# Patient Record
Sex: Female | Born: 1965 | Race: White | Hispanic: No | Marital: Married | Smoking: Current every day smoker
Health system: Southern US, Community
[De-identification: ages and names within clinical notes are randomized; demographics above are authoritative.]

## PROBLEM LIST (undated history)

## (undated) DIAGNOSIS — Z973 Presence of spectacles and contact lenses: Secondary | ICD-10-CM

## (undated) DIAGNOSIS — N393 Stress incontinence (female) (male): Secondary | ICD-10-CM

## (undated) DIAGNOSIS — R002 Palpitations: Secondary | ICD-10-CM

## (undated) DIAGNOSIS — I824Z9 Acute embolism and thrombosis of unspecified deep veins of unspecified distal lower extremity: Secondary | ICD-10-CM

## (undated) HISTORY — DX: Acute embolism and thrombosis of unspecified deep veins of unspecified distal lower extremity: I82.4Z9

## (undated) HISTORY — DX: Palpitations: R00.2

## (undated) HISTORY — PX: OTHER SURGICAL HISTORY: SHX169

---

## 1993-11-10 HISTORY — PX: ABDOMINAL HYSTERECTOMY: SHX81

## 2000-09-01 ENCOUNTER — Other Ambulatory Visit: Admission: RE | Admit: 2000-09-01 | Discharge: 2000-09-01 | Payer: Self-pay | Admitting: Obstetrics and Gynecology

## 2001-01-25 ENCOUNTER — Inpatient Hospital Stay (HOSPITAL_COMMUNITY): Admission: RE | Admit: 2001-01-25 | Discharge: 2001-01-28 | Payer: Self-pay | Admitting: Obstetrics and Gynecology

## 2001-01-25 HISTORY — PX: OTHER SURGICAL HISTORY: SHX169

## 2002-01-20 ENCOUNTER — Other Ambulatory Visit: Admission: RE | Admit: 2002-01-20 | Discharge: 2002-01-20 | Payer: Self-pay | Admitting: Obstetrics and Gynecology

## 2003-05-24 ENCOUNTER — Other Ambulatory Visit: Admission: RE | Admit: 2003-05-24 | Discharge: 2003-05-24 | Payer: Self-pay | Admitting: Obstetrics and Gynecology

## 2004-08-15 ENCOUNTER — Other Ambulatory Visit: Admission: RE | Admit: 2004-08-15 | Discharge: 2004-08-15 | Payer: Self-pay | Admitting: Obstetrics and Gynecology

## 2005-03-11 ENCOUNTER — Ambulatory Visit: Payer: Self-pay | Admitting: Family Medicine

## 2005-11-07 ENCOUNTER — Other Ambulatory Visit: Admission: RE | Admit: 2005-11-07 | Discharge: 2005-11-07 | Payer: Self-pay | Admitting: Obstetrics and Gynecology

## 2006-11-20 ENCOUNTER — Other Ambulatory Visit: Admission: RE | Admit: 2006-11-20 | Discharge: 2006-11-20 | Payer: Self-pay | Admitting: Obstetrics and Gynecology

## 2007-11-26 ENCOUNTER — Other Ambulatory Visit: Admission: RE | Admit: 2007-11-26 | Discharge: 2007-11-26 | Payer: Self-pay | Admitting: Obstetrics and Gynecology

## 2009-01-09 ENCOUNTER — Other Ambulatory Visit: Admission: RE | Admit: 2009-01-09 | Discharge: 2009-01-09 | Payer: Self-pay | Admitting: Obstetrics and Gynecology

## 2009-01-09 ENCOUNTER — Ambulatory Visit: Payer: Self-pay | Admitting: Obstetrics and Gynecology

## 2009-01-09 ENCOUNTER — Encounter: Payer: Self-pay | Admitting: Obstetrics and Gynecology

## 2010-02-25 ENCOUNTER — Ambulatory Visit: Payer: Self-pay | Admitting: Obstetrics and Gynecology

## 2010-02-25 ENCOUNTER — Other Ambulatory Visit: Admission: RE | Admit: 2010-02-25 | Discharge: 2010-02-25 | Payer: Self-pay | Admitting: Obstetrics and Gynecology

## 2011-03-28 NOTE — H&P (Signed)
[facility]  Patient:    Miranda Fleming, Miranda Fleming                    MRN: [id number] Attending:  Rande Brunt. Eda Paschal, M.D.                         History and Physical  CHIEF COMPLAINT:  Persistent left ovarian cyst.  HISTORY OF PRESENT ILLNESS:  The patient is a 45 year old gravida 2, para 2, AB 0, who had been found to have a cyst on her left ovary.  It was first seen in November.  Follow-up ultrasound on February 6 showed that it had persisted. Although it is only approximately 2 cm, it is complex.  It has an irregular cystic lumen in it.  It has increased vascular flow to it.  There is also a second cyst next to it with a thick wall but is otherwise echo-free.  Patient has been given the option of observing it because of the unlikelihood that it is malignant, but obviously because of the complexity of the cyst plus the increased vascular flow, she understands that that would not be a completely safe procedure.  In addition to that, her grandmother on her mothers side had ovarian cancer.  In addition, her mother has had breast cancer.  As a result of increased family history, persistent left ovarian cyst, she now enters the hospital for exploratory laparotomy and definitive surgery.  Patient has a history of recurrent ovarian cysts.  In 1987 in [place], [place], she underwent a laparoscopy for ovarian cyst.  In 1989, 1990, and 1993 in [place], [place], she underwent both laparoscopies and laparotomies for persistent ovarian cyst.  In 1994, in [place], she underwent another laparotomy because of an additional ovarian cyst, and finally in 1995, on her sixth procedure, she underwent a TAH-right S&O for pelvic adhesive disease, pelvic pain, and recurrent ovarian cysts.  Because of her strong family history and her unwillingness to have to go still another procedure after this one, she will undergo a left salpingo-oophorectomy.  She understands  the issues of estrogen replacement therapy but based on family history and number of previous procedures, she elects the above.  PAST MEDICAL HISTORY:  Please see above.  MEDICATIONS:  Advil for pain.  ALLERGIES:  PENICILLIN.  FAMILY HISTORY:  See above.  In addition, she has history of lung and prostate cancer in her family.  She has an aunt on her mothers side with diabetes. Her grandmother, father, and mother are all hypertensive.  SOCIAL HISTORY:  She was a smoker for a long period of time but has now quit over a year ago, and she uses alcohol occasionally.  REVIEW OF SYSTEMS:  HEENT:  Negative.  CARDIAC:  Negative.  RESPIRATORY: Negative.  GASTROINTESTINAL:  Negative.  GENITOURINARY:  Negative. NEUROMUSCULAR:  Negative.  ENDOCRINE:  Negative.  PSYCHIATRIC:  Negative.  PHYSICAL EXAMINATION:  VITAL SIGNS:  Blood pressure is 116/60, pulse is 80 and regular, respirations 16 and nonlabored.  She is afebrile.  GENERAL:  The patient is a well-developed, well-nourished female in no acute distress.  HEENT:  All within normal limits.  NECK:  Supple.  Trachea in the midline.  Thyroid is not enlarged.  CHEST:  The lungs are clear to P&A.  CARDIAC:  No thrills, heaves, or murmurs.  BREASTS:  No masses.  ABDOMEN:  Soft without guarding, rebound, or masses.  PELVIC:  External and vaginal is within normal limits.  Pap smear shows no atypia.  Bimanual and rectal fail to reveal the persistent ovarian cyst.  EXTREMITIES:  Within normal limits.  ADMISSION IMPRESSION:  Persistent recurrent left ovarian cyst with a family history of ovarian and breast cancer.  PLAN:  Exploratory laparotomy with left salpingo-oophorectomy. DD:  01/25/01 TD:  01/25/01 Job: [id number]

## 2011-03-28 NOTE — Discharge Summary (Signed)
[facility]  Patient:    Miranda Fleming, Miranda Fleming               MRN: [id number] Adm. Date:  [id number] Disc. Date: [id number] Attending:  Sharon Mt                           Discharge Summary  HOSPITAL COURSE: The patient is a 45 year old female who was admitted to the hospital with persistent adnexal mass and a very bad family history of ovarian and breast cancer.  On the day of admission, she was taken to the operating room.  Exploratory laparotomy with left salpingo-oophorectomy was performed.  Frozen section was benign.  Postoperatively, the patient had a little bit of trouble with an ileus.  She was maintained on IV fluids, given Dulcolax suppositories, and finally by very late on the second postoperative night she started to pass gas.  By the third postoperative day, she was stable and ready for discharge.  DISCHARGE MEDICATIONS: 1. Climara patch 0.05 mg 1 weekly. 2. Tylox for pain relief.  ACTIVITY:  Ambulatory.  DIET:  Regular.  FOLLOW-UP:  She is to return to our office in three weeks for further follow-up.  PATHOLOGY:  Final pathology report revealed benign ovary with cystic follicles and fallopian tubes associated with benign peritubal cyst.  DISCHARGE DIAGNOSIS:  Persistent left adnexal mass.  PROCEDURES:  Exploratory laparotomy with left salpingo-oophorectomy.  CONDITION ON DISCHARGE:  Improved. DD:  01/28/01 TD:  01/28/01 Job: [id number]

## 2011-03-28 NOTE — Op Note (Signed)
[facility]  Patient:    Miranda Fleming, Miranda Fleming               MRN: [id number] Proc. Date: 01/25/01 Adm. Date:  [id number] Attending:  Sharon Mt                           Operative Report  PREOPERATIVE DIAGNOSIS:  Persistent left ovarian cyst.  POSTOPERATIVE DIAGNOSIS:  Persistent left ovarian cyst, benign.  OPERATION PERFORMED:  Exploratory laparotomy with left S&O.  SURGEON:  Dr. Eda Paschal.  FIRST ASSISTANT:  Dr. Katy Fitch, M.D.  ANESTHESIA:  General endotracheal.  FINDINGS:  At the time of laparotomy, the patient had some sigmoid colon adherent to the cul-de-sac. The right broad ligament was completely free of any disease. The ileocecal junction was identified and was free of any disease. The patient had a normal appendix. The right upper quadrant was visualized and was free of any disease. The left ovary had an enlargement of a 2-3 cm cyst. Once the cyst had been removed which was done intact, it was opened and had clear fluid in the cyst as well as some hemorrhage in it.  DESCRIPTION OF PROCEDURE:  After adequate general endotracheal anesthesia, the patient was placed in the supine position and prepped and draped in the usual sterile fashion. A Foley catheter was in the patients bladder, a Pfannenstiel incision was made, the fascia was opened transversely, and the peritoneum was entered vertically. Subcutaneous bleeders were clamped and Bovied as encountered. When the peritoneal cavity was opened, omental adhesions were lysed freeing up the omentum from the anterior peritoneal wall. Sigmoid colon adhesions were lysed freeing up the pelvis. Peritoneal washings were then obtained. At this point, the round ligament was bovied and cut. The vesicouterine fold to peritoneum and the peritoneum towards the IP ligament were sharply incised. The retroperitoneal space was entered, the ureter was identified. The infundibulopelvic ligament  was bilaterally clamped, cut and suture ligated with #0 Vicryl. The adnexa was freed up from the broad ligament by sharp dissection. The remaining pedicle was clamped and cut and tied with #0 Vicryl and frozen section was obtained which came back benign. Two sponge, needle and instrument counts were correct. The fascia was closed with two running #0 Vicryl and the skin was closed with staples. Estimated blood loss for the entire procedure was 100 cc with none replaced. The patient tolerated the procedure well and left the operating room in satisfactory condition draining clear urine from her Foley catheter. DD:  01/25/01 TD:  01/25/01 Job: [id number]

## 2011-04-30 ENCOUNTER — Encounter (INDEPENDENT_AMBULATORY_CARE_PROVIDER_SITE_OTHER): Payer: BC Managed Care – PPO | Admitting: Obstetrics and Gynecology

## 2011-04-30 ENCOUNTER — Other Ambulatory Visit: Payer: Self-pay | Admitting: Obstetrics and Gynecology

## 2011-04-30 ENCOUNTER — Other Ambulatory Visit (HOSPITAL_COMMUNITY)
Admission: RE | Admit: 2011-04-30 | Discharge: 2011-04-30 | Disposition: A | Discharge status: Home / Self Care | Payer: BC Managed Care – PPO | Source: Ambulatory Visit | Attending: Obstetrics and Gynecology | Admitting: Obstetrics and Gynecology

## 2011-04-30 DIAGNOSIS — N951 Menopausal and female climacteric states: Secondary | ICD-10-CM

## 2011-04-30 DIAGNOSIS — Z01419 Encounter for gynecological examination (general) (routine) without abnormal findings: Secondary | ICD-10-CM

## 2011-04-30 DIAGNOSIS — Z124 Encounter for screening for malignant neoplasm of cervix: Secondary | ICD-10-CM | POA: Insufficient documentation

## 2011-04-30 DIAGNOSIS — Z13220 Encounter for screening for lipoid disorders: Secondary | ICD-10-CM

## 2011-04-30 DIAGNOSIS — R809 Proteinuria, unspecified: Secondary | ICD-10-CM

## 2011-05-20 ENCOUNTER — Encounter (INDEPENDENT_AMBULATORY_CARE_PROVIDER_SITE_OTHER): Payer: BC Managed Care – PPO

## 2011-05-20 DIAGNOSIS — M899 Disorder of bone, unspecified: Secondary | ICD-10-CM

## 2011-07-26 ENCOUNTER — Other Ambulatory Visit: Payer: Self-pay | Admitting: Obstetrics and Gynecology

## 2011-07-26 DIAGNOSIS — F419 Anxiety disorder, unspecified: Secondary | ICD-10-CM

## 2011-07-28 NOTE — Telephone Encounter (Signed)
Rx for Xanax 0.25 by mouth at bedtime when necessary #30 with 1 refill was called into [facility] per patient's request.

## 2011-09-09 ENCOUNTER — Encounter: Payer: Self-pay | Admitting: Obstetrics and Gynecology

## 2011-09-21 ENCOUNTER — Other Ambulatory Visit: Payer: Self-pay | Admitting: Obstetrics and Gynecology

## 2011-09-22 NOTE — Telephone Encounter (Signed)
GENERIC XANAX RX CALLED IN.

## 2011-12-18 ENCOUNTER — Other Ambulatory Visit: Payer: Self-pay | Admitting: Obstetrics and Gynecology

## 2012-02-10 ENCOUNTER — Telehealth: Payer: Self-pay | Admitting: *Deleted

## 2012-02-10 MED ORDER — MOMETASONE FUROATE 50 MCG/ACT NA SUSP: 2.0000 | Freq: Every day | NASAL | Status: DC

## 2012-02-10 NOTE — Telephone Encounter (Signed)
If  Nasonex working okay to refill.

## 2012-02-10 NOTE — Telephone Encounter (Signed)
Lm on pt vm that rx sent to pharmacy.

## 2012-02-10 NOTE — Telephone Encounter (Signed)
Pt is calling c/o allergy problems. Pt was given a sample of nasonex at urgent care a couple months ago. She would like a rx with this to help with her allergies. She wasn't sure if you would give this to her or go back to urgent care? Please advise

## 2012-03-17 ENCOUNTER — Other Ambulatory Visit: Payer: Self-pay | Admitting: Obstetrics and Gynecology

## 2012-03-18 NOTE — Telephone Encounter (Signed)
rx called in

## 2012-05-12 ENCOUNTER — Other Ambulatory Visit: Payer: Self-pay | Admitting: Obstetrics and Gynecology

## 2012-06-10 ENCOUNTER — Other Ambulatory Visit: Payer: Self-pay | Admitting: Obstetrics and Gynecology

## 2012-06-11 NOTE — Telephone Encounter (Signed)
rx called in

## 2012-07-01 ENCOUNTER — Encounter: Payer: Self-pay | Admitting: Obstetrics and Gynecology

## 2012-07-01 ENCOUNTER — Ambulatory Visit (INDEPENDENT_AMBULATORY_CARE_PROVIDER_SITE_OTHER): Payer: BC Managed Care – PPO | Admitting: Obstetrics and Gynecology

## 2012-07-01 VITALS — BP 120/76 | Ht 65.0 in | Wt 128.0 lb

## 2012-07-01 DIAGNOSIS — N83209 Unspecified ovarian cyst, unspecified side: Secondary | ICD-10-CM | POA: Insufficient documentation

## 2012-07-01 DIAGNOSIS — F419 Anxiety disorder, unspecified: Secondary | ICD-10-CM | POA: Insufficient documentation

## 2012-07-01 DIAGNOSIS — Z01419 Encounter for gynecological examination (general) (routine) without abnormal findings: Secondary | ICD-10-CM

## 2012-07-01 LAB — CBC WITH DIFFERENTIAL/PLATELET
Basophils Absolute: 0 10*3/uL (ref 0.0–0.1)
Basophils Relative: 0 % (ref 0–1)
Eosinophils Absolute: 0.1 10*3/uL (ref 0.0–0.7)
Eosinophils Relative: 1 % (ref 0–5)
HCT: 37.1 % (ref 36.0–46.0)
MCHC: 34 g/dL (ref 30.0–36.0)
MCV: 92.1 fL (ref 78.0–100.0)
Monocytes Absolute: 0.5 10*3/uL (ref 0.1–1.0)
Neutro Abs: 3.6 10*3/uL (ref 1.7–7.7)
RDW: 13.4 % (ref 11.5–15.5)

## 2012-07-01 MED ORDER — ALPRAZOLAM 0.25 MG PO TABS: 0.2500 mg | ORAL_TABLET | Freq: Every evening | ORAL | Status: DC | PRN

## 2012-07-01 MED ORDER — ESTRADIOL 0.5 MG PO TABS: 0.5000 mg | ORAL_TABLET | Freq: Every day | ORAL | Status: DC

## 2012-07-01 NOTE — Patient Instructions (Signed)
Continue yearly mammograms. Bone density in 2014. 

## 2012-07-01 NOTE — Progress Notes (Signed)
Patient came to see me today for her annual GYN exam. She is status post TAH, right S&O done in 1994 for persistent ovarian cysts, dysmenorrhea and dyspareunia. She then had a left S&O in 2002 for persistent ovarian cysts and pelvic pain. She has done well since then. She does well on half a milligram of estradiol daily for estrogen replacement. She is up-to-date on mammograms. She had a bone density in July, 2012 showing low bone mass without an elevated FRAX risk. She has never had an abnormal Pap smear. Her last Pap smear was 2012. She takes Xanax for anxiety that we give her. She uses 30-35 per month. She is having no vaginal bleeding. She is having no pelvic pain.  HEENT: Within normal limits.Kennon Portela present. Neck: No masses. Supraclavicular lymph nodes: Not enlarged. Breasts: Examined in both sitting and lying position. Symmetrical without skin changes or masses. Abdomen: Soft no masses guarding or rebound. No hernias. Pelvic: External within normal limits. BUS within normal limits. Vaginal examination shows good estrogen effect, no cystocele enterocele or rectocele. Cervix and uterus absent. Adnexa within normal limits. Rectovaginal confirmatory. Extremities within normal limits.  Assessment: #1. Normal GYN exam #2. Osteopenia #3. Anxiety  Plan: Continue estradiol half a milligram daily. Continue Xanax. Continue yearly mammograms. Bone density in 2014.The new Pap smear guidelines were discussed with the patient. No pap done.

## 2012-07-02 LAB — URINALYSIS W MICROSCOPIC + REFLEX CULTURE
Bilirubin Urine: NEGATIVE
Leukocytes, UA: NEGATIVE
Nitrite: NEGATIVE
Protein, ur: NEGATIVE mg/dL
Squamous Epithelial / LPF: NONE SEEN
Urobilinogen, UA: 0.2 mg/dL (ref 0.0–1.0)

## 2012-07-02 LAB — LIPID PANEL
HDL: 74 mg/dL (ref >39)
LDL Cholesterol: 69 mg/dL (ref 0–99)
Triglycerides: 98 mg/dL (ref <150)

## 2012-07-02 LAB — COMPREHENSIVE METABOLIC PANEL
AST: 17 U/L (ref 0–37)
Alkaline Phosphatase: 35 U/L — ABNORMAL LOW (ref 39–117)
BUN: 13 mg/dL (ref 6–23)
Creat: 0.66 mg/dL (ref 0.50–1.10)
Total Bilirubin: 0.7 mg/dL (ref 0.3–1.2)

## 2012-07-07 ENCOUNTER — Telehealth: Payer: Self-pay | Admitting: *Deleted

## 2012-07-07 MED ORDER — ESTRADIOL 1 MG PO TABS: 1.0000 mg | ORAL_TABLET | Freq: Every day | ORAL | Status: DC

## 2012-07-07 NOTE — Addendum Note (Signed)
Addended by: Aura Camps on: 07/07/2012 02:48 PM   Modules accepted: Orders

## 2012-07-07 NOTE — Telephone Encounter (Signed)
Pt had annual done on 07/01/12 giving rx for estradiol .5 mg pt said that she takes 1 mg daily. Per paper chart in 04/30/11 pt had increase on estradiol dose. Please advise

## 2012-07-07 NOTE — Telephone Encounter (Signed)
Give her years prescription for 1 mg estradiol.

## 2012-07-07 NOTE — Telephone Encounter (Signed)
Correct rx sent, left on voicemail rx sent.

## 2012-07-09 ENCOUNTER — Encounter: Payer: Self-pay | Admitting: Obstetrics and Gynecology

## 2012-09-02 ENCOUNTER — Encounter: Payer: Self-pay | Admitting: Obstetrics and Gynecology

## 2013-09-01 ENCOUNTER — Other Ambulatory Visit: Payer: Self-pay | Admitting: Obstetrics and Gynecology

## 2013-09-28 ENCOUNTER — Other Ambulatory Visit: Payer: Self-pay | Admitting: Urology

## 2013-10-26 ENCOUNTER — Encounter (HOSPITAL_BASED_OUTPATIENT_CLINIC_OR_DEPARTMENT_OTHER): Payer: Self-pay | Admitting: *Deleted

## 2013-10-26 NOTE — Progress Notes (Signed)
NPO AFTER MN. ARRIVE AT 0715. NEEDS HG. WILL TAKE ESTRADIOL AM DOS W/ SIP OF WATER. REVIEWED RCC GUIDELINES.

## 2013-10-31 ENCOUNTER — Encounter (HOSPITAL_BASED_OUTPATIENT_CLINIC_OR_DEPARTMENT_OTHER)
Admission: RE | Disposition: A | Discharge status: Home / Self Care | Payer: Self-pay | Source: Ambulatory Visit | Attending: Urology

## 2013-10-31 ENCOUNTER — Encounter (HOSPITAL_BASED_OUTPATIENT_CLINIC_OR_DEPARTMENT_OTHER): Payer: BC Managed Care – PPO | Admitting: Anesthesiology

## 2013-10-31 ENCOUNTER — Ambulatory Visit (HOSPITAL_BASED_OUTPATIENT_CLINIC_OR_DEPARTMENT_OTHER)
Admission: RE | Admit: 2013-10-31 | Discharge: 2013-11-01 | Disposition: A | Discharge status: Home / Self Care | Payer: BC Managed Care – PPO | Source: Ambulatory Visit | Attending: Urology | Admitting: Urology

## 2013-10-31 ENCOUNTER — Encounter (HOSPITAL_BASED_OUTPATIENT_CLINIC_OR_DEPARTMENT_OTHER): Payer: Self-pay | Admitting: Anesthesiology

## 2013-10-31 ENCOUNTER — Ambulatory Visit (HOSPITAL_BASED_OUTPATIENT_CLINIC_OR_DEPARTMENT_OTHER): Payer: BC Managed Care – PPO | Admitting: Anesthesiology

## 2013-10-31 DIAGNOSIS — Z90710 Acquired absence of both cervix and uterus: Secondary | ICD-10-CM | POA: Insufficient documentation

## 2013-10-31 DIAGNOSIS — Z87891 Personal history of nicotine dependence: Secondary | ICD-10-CM | POA: Insufficient documentation

## 2013-10-31 DIAGNOSIS — N393 Stress incontinence (female) (male): Secondary | ICD-10-CM | POA: Diagnosis present

## 2013-10-31 HISTORY — PX: PUBOVAGINAL SLING: SHX1035

## 2013-10-31 HISTORY — DX: Presence of spectacles and contact lenses: Z97.3

## 2013-10-31 HISTORY — DX: Stress incontinence (female) (male): N39.3

## 2013-10-31 SURGERY — CREATION, PUBOVAGINAL SLING
Anesthesia: General | Site: Vagina

## 2013-10-31 MED ORDER — PROPOFOL 10 MG/ML IV BOLUS
INTRAVENOUS | Status: DC | PRN
  Administered 2013-10-31: 200 mg via INTRAVENOUS
  Administered 2013-10-31: 50 mg via INTRAVENOUS

## 2013-10-31 MED ORDER — CIPROFLOXACIN HCL 500 MG PO TABS: 500.0000 mg | ORAL_TABLET | Freq: Two times a day (BID) | ORAL | Status: DC

## 2013-10-31 MED ORDER — DIPHENHYDRAMINE HCL 50 MG/ML IJ SOLN
12.5000 mg | Freq: Four times a day (QID) | INTRAMUSCULAR | Status: DC | PRN
  Filled 2013-10-31: qty 0.5

## 2013-10-31 MED ORDER — ESTRADIOL 0.1 MG/GM VA CREA
TOPICAL_CREAM | VAGINAL | Status: DC | PRN
  Administered 2013-10-31: 1 via VAGINAL

## 2013-10-31 MED ORDER — LACTATED RINGERS IV SOLN
INTRAVENOUS | Status: DC
  Administered 2013-10-31: 08:00:00 via INTRAVENOUS
  Filled 2013-10-31: qty 1000

## 2013-10-31 MED ORDER — OXYCODONE-ACETAMINOPHEN 5-325 MG PO TABS
ORAL_TABLET | ORAL | Status: AC
  Filled 2013-10-31: qty 2

## 2013-10-31 MED ORDER — HYDROMORPHONE HCL PF 1 MG/ML IJ SOLN
0.2500 mg | INTRAMUSCULAR | Status: DC | PRN
  Filled 2013-10-31: qty 1

## 2013-10-31 MED ORDER — DEXAMETHASONE SODIUM PHOSPHATE 4 MG/ML IJ SOLN
INTRAMUSCULAR | Status: DC | PRN
  Administered 2013-10-31: 10 mg via INTRAVENOUS

## 2013-10-31 MED ORDER — FENTANYL CITRATE 0.05 MG/ML IJ SOLN
INTRAMUSCULAR | Status: AC
  Filled 2013-10-31: qty 6

## 2013-10-31 MED ORDER — SODIUM CHLORIDE 0.9 % IR SOLN
Status: DC | PRN
  Administered 2013-10-31: 09:00:00

## 2013-10-31 MED ORDER — MIDAZOLAM HCL 5 MG/5ML IJ SOLN
INTRAMUSCULAR | Status: DC | PRN
  Administered 2013-10-31 (×2): 1 mg via INTRAVENOUS

## 2013-10-31 MED ORDER — CIPROFLOXACIN IN D5W 200 MG/100ML IV SOLN
INTRAVENOUS | Status: AC
  Filled 2013-10-31: qty 100

## 2013-10-31 MED ORDER — MIDAZOLAM HCL 2 MG/2ML IJ SOLN
INTRAMUSCULAR | Status: AC
  Filled 2013-10-31: qty 2

## 2013-10-31 MED ORDER — DIPHENHYDRAMINE HCL 12.5 MG/5ML PO ELIX
12.5000 mg | ORAL_SOLUTION | Freq: Four times a day (QID) | ORAL | Status: DC | PRN
  Filled 2013-10-31: qty 10

## 2013-10-31 MED ORDER — ZOLPIDEM TARTRATE 5 MG PO TABS
5.0000 mg | ORAL_TABLET | Freq: Every evening | ORAL | Status: DC | PRN
  Administered 2013-10-31: 5 mg via ORAL
  Filled 2013-10-31: qty 1

## 2013-10-31 MED ORDER — MIDAZOLAM HCL 5 MG/ML IJ SOLN
0.5000 mg | INTRAMUSCULAR | Status: DC | PRN
  Administered 2013-10-31 (×2): 0.5 mg via INTRAVENOUS
  Administered 2013-10-31: 1 mg via INTRAVENOUS
  Filled 2013-10-31: qty 1

## 2013-10-31 MED ORDER — ESTRADIOL 1 MG PO TABS
1.0000 mg | ORAL_TABLET | Freq: Every day | ORAL | Status: DC
  Filled 2013-10-31: qty 1

## 2013-10-31 MED ORDER — FLUTICASONE PROPIONATE 50 MCG/ACT NA SUSP
1.0000 | Freq: Every day | NASAL | Status: DC
  Filled 2013-10-31: qty 16

## 2013-10-31 MED ORDER — HYDROMORPHONE HCL PF 1 MG/ML IJ SOLN
0.5000 mg | INTRAMUSCULAR | Status: DC | PRN
  Filled 2013-10-31: qty 1

## 2013-10-31 MED ORDER — CIPROFLOXACIN HCL 250 MG PO TABS
ORAL_TABLET | ORAL | Status: AC
  Filled 2013-10-31: qty 1

## 2013-10-31 MED ORDER — OXYCODONE-ACETAMINOPHEN 5-325 MG PO TABS
ORAL_TABLET | ORAL | Status: AC
  Filled 2013-10-31: qty 1

## 2013-10-31 MED ORDER — ONDANSETRON HCL 4 MG/2ML IJ SOLN
INTRAMUSCULAR | Status: DC | PRN
  Administered 2013-10-31: 4 mg via INTRAVENOUS

## 2013-10-31 MED ORDER — KETOROLAC TROMETHAMINE 30 MG/ML IJ SOLN
15.0000 mg | Freq: Once | INTRAMUSCULAR | Status: AC | PRN
  Filled 2013-10-31: qty 1

## 2013-10-31 MED ORDER — SENNA 8.6 MG PO TABS
1.0000 | ORAL_TABLET | Freq: Two times a day (BID) | ORAL | Status: DC
  Administered 2013-10-31: 8.6 mg via ORAL
  Filled 2013-10-31: qty 1

## 2013-10-31 MED ORDER — ACETAMINOPHEN 10 MG/ML IV SOLN
INTRAVENOUS | Status: DC | PRN
  Administered 2013-10-31: 1000 mg via INTRAVENOUS

## 2013-10-31 MED ORDER — OXYCODONE-ACETAMINOPHEN 5-325 MG PO TABS
1.0000 | ORAL_TABLET | ORAL | Status: DC | PRN
  Administered 2013-10-31: 2 via ORAL
  Administered 2013-10-31 (×2): 1 via ORAL
  Administered 2013-11-01: 2 via ORAL
  Filled 2013-10-31: qty 2

## 2013-10-31 MED ORDER — FENTANYL CITRATE 0.05 MG/ML IJ SOLN
INTRAMUSCULAR | Status: DC | PRN
  Administered 2013-10-31: 25 ug via INTRAVENOUS
  Administered 2013-10-31: 50 ug via INTRAVENOUS
  Administered 2013-10-31 (×9): 25 ug via INTRAVENOUS

## 2013-10-31 MED ORDER — PHENAZOPYRIDINE HCL 100 MG PO TABS
ORAL_TABLET | ORAL | Status: AC
  Filled 2013-10-31: qty 2

## 2013-10-31 MED ORDER — KETOROLAC TROMETHAMINE 30 MG/ML IJ SOLN
INTRAMUSCULAR | Status: DC | PRN
  Administered 2013-10-31: 30 mg via INTRAVENOUS

## 2013-10-31 MED ORDER — BUPIVACAINE-EPINEPHRINE 0.5% -1:200000 IJ SOLN
INTRAMUSCULAR | Status: DC | PRN
  Administered 2013-10-31: 25 mL

## 2013-10-31 MED ORDER — CIPROFLOXACIN IN D5W 400 MG/200ML IV SOLN
INTRAVENOUS | Status: AC
  Filled 2013-10-31: qty 200

## 2013-10-31 MED ORDER — SODIUM CHLORIDE 0.45 % IV SOLN
INTRAVENOUS | Status: DC
  Administered 2013-10-31 – 2013-11-01 (×2): via INTRAVENOUS
  Filled 2013-10-31: qty 1000

## 2013-10-31 MED ORDER — CIPROFLOXACIN HCL 500 MG PO TABS
500.0000 mg | ORAL_TABLET | Freq: Two times a day (BID) | ORAL | Status: DC
  Administered 2013-10-31 – 2013-11-01 (×2): 500 mg via ORAL
  Filled 2013-10-31: qty 1

## 2013-10-31 MED ORDER — BELLADONNA ALKALOIDS-OPIUM 16.2-60 MG RE SUPP
RECTAL | Status: AC
  Filled 2013-10-31: qty 1

## 2013-10-31 MED ORDER — OXYCODONE-ACETAMINOPHEN 5-325 MG PO TABS: 1.0000 | ORAL_TABLET | ORAL | Status: DC | PRN

## 2013-10-31 MED ORDER — ONDANSETRON HCL 4 MG/2ML IJ SOLN
4.0000 mg | INTRAMUSCULAR | Status: DC | PRN
  Filled 2013-10-31: qty 2

## 2013-10-31 MED ORDER — LACTATED RINGERS IV SOLN
INTRAVENOUS | Status: DC | PRN
  Administered 2013-10-31 (×2): via INTRAVENOUS

## 2013-10-31 MED ORDER — ACETAMINOPHEN 325 MG PO TABS
650.0000 mg | ORAL_TABLET | ORAL | Status: DC | PRN
  Filled 2013-10-31: qty 2

## 2013-10-31 MED ORDER — BELLADONNA ALKALOIDS-OPIUM 16.2-60 MG RE SUPP
RECTAL | Status: DC | PRN
  Administered 2013-10-31: 1 via RECTAL

## 2013-10-31 MED ORDER — STERILE WATER FOR IRRIGATION IR SOLN
Status: DC | PRN
  Administered 2013-10-31: 3000 mL via INTRAVESICAL

## 2013-10-31 MED ORDER — ZOLPIDEM TARTRATE 5 MG PO TABS
ORAL_TABLET | ORAL | Status: AC
  Filled 2013-10-31: qty 1

## 2013-10-31 MED ORDER — ESTRADIOL 1 MG PO TABS
0.5000 mg | ORAL_TABLET | Freq: Every day | ORAL | Status: DC
  Filled 2013-10-31: qty 0.5

## 2013-10-31 MED ORDER — SENNA 8.6 MG PO TABS
ORAL_TABLET | ORAL | Status: AC
  Filled 2013-10-31: qty 1

## 2013-10-31 MED ORDER — CIPROFLOXACIN IN D5W 400 MG/200ML IV SOLN
400.0000 mg | INTRAVENOUS | Status: AC
  Administered 2013-10-31: 400 mg via INTRAVENOUS
  Filled 2013-10-31: qty 200

## 2013-10-31 MED ORDER — PHENAZOPYRIDINE HCL 200 MG PO TABS
200.0000 mg | ORAL_TABLET | Freq: Once | ORAL | Status: AC
  Administered 2013-10-31: 200 mg via ORAL
  Filled 2013-10-31: qty 1

## 2013-10-31 MED ORDER — LIDOCAINE HCL (CARDIAC) 20 MG/ML IV SOLN
INTRAVENOUS | Status: DC | PRN
  Administered 2013-10-31: 100 mg via INTRAVENOUS

## 2013-10-31 MED ORDER — PROMETHAZINE HCL 25 MG/ML IJ SOLN
6.2500 mg | INTRAMUSCULAR | Status: DC | PRN
  Filled 2013-10-31: qty 1

## 2013-10-31 SURGICAL SUPPLY — 62 items
ADH SKN CLS APL DERMABOND .7 (GAUZE/BANDAGES/DRESSINGS) ×1
BAG URINE DRAINAGE (UROLOGICAL SUPPLIES) ×2 IMPLANT
BLADE SURG 10 STRL SS (BLADE) ×2 IMPLANT
BLADE SURG 15 STRL LF DISP TIS (BLADE) ×1 IMPLANT
BLADE SURG 15 STRL SS (BLADE) ×2
BLADE SURG ROTATE 9660 (MISCELLANEOUS) ×2 IMPLANT
BOOTIES KNEE HIGH SLOAN (MISCELLANEOUS) ×2 IMPLANT
CANISTER SUCTION 1200CC (MISCELLANEOUS) IMPLANT
CANISTER SUCTION 2500CC (MISCELLANEOUS) ×3 IMPLANT
CATH FOLEY 2WAY  5CC 16FR SIL (CATHETERS) ×1
CATH FOLEY 2WAY 5CC 16FR SIL (CATHETERS) IMPLANT
CATH FOLEY 2WAY SLVR  5CC 16FR (CATHETERS)
CATH FOLEY 2WAY SLVR 5CC 16FR (CATHETERS) ×1 IMPLANT
COVER MAYO STAND STRL (DRAPES) ×2 IMPLANT
COVER TABLE BACK 60X90 (DRAPES) ×2 IMPLANT
DERMABOND ADVANCED (GAUZE/BANDAGES/DRESSINGS) ×1
DERMABOND ADVANCED .7 DNX12 (GAUZE/BANDAGES/DRESSINGS) IMPLANT
DEVICE CAPIO SLIM BOX (INSTRUMENTS) IMPLANT
DISSECTOR ROUND CHERRY 3/8 STR (MISCELLANEOUS) IMPLANT
DRAPE CAMERA CLOSED 9X96 (DRAPES) ×2 IMPLANT
DRAPE UNDERBUTTOCKS STRL (DRAPE) ×2 IMPLANT
FLOSEAL 10ML (HEMOSTASIS) IMPLANT
GAUZE SPONGE 4X4 16PLY XRAY LF (GAUZE/BANDAGES/DRESSINGS) IMPLANT
GLOVE BIO SURGEON STRL SZ7.5 (GLOVE) ×1 IMPLANT
GLOVE ECLIPSE 6.5 STRL STRAW (GLOVE) ×1 IMPLANT
GLOVE INDICATOR 7.0 STRL GRN (GLOVE) ×1 IMPLANT
GLOVE INDICATOR 7.5 STRL GRN (GLOVE) ×1 IMPLANT
GLOVE INDICATOR 8.0 STRL GRN (GLOVE) ×1 IMPLANT
GOWN PREVENTION PLUS LG XLONG (DISPOSABLE) ×2 IMPLANT
GOWN STRL REIN XL XLG (GOWN DISPOSABLE) ×2 IMPLANT
NDL 1/2 CIR CATGUT .05X1.09 (NEEDLE) IMPLANT
NEEDLE 1/2 CIR CATGUT .05X1.09 (NEEDLE) IMPLANT
NEEDLE HYPO 22GX1.5 SAFETY (NEEDLE) ×4 IMPLANT
PACKING VAGINAL (PACKING) ×2 IMPLANT
PENCIL BUTTON HOLSTER BLD 10FT (ELECTRODE) ×2 IMPLANT
PLUG CATH AND CAP STER (CATHETERS) ×2 IMPLANT
RETRACTOR LONRSTAR 16.6X16.6CM (MISCELLANEOUS) ×1 IMPLANT
RETRACTOR STAY HOOK 5MM (MISCELLANEOUS) ×2 IMPLANT
RETRACTOR STER APS 16.6X16.6CM (MISCELLANEOUS) ×2
SET IRRIG Y TYPE TUR BLADDER L (SET/KITS/TRAYS/PACK) ×2 IMPLANT
SHEET LAVH (DRAPES) ×2 IMPLANT
SLING LYNX SUPRAPUBIC (Sling) ×1 IMPLANT
SLING SOLYX SYSTEM SIS BX (SLING) IMPLANT
SPONGE LAP 4X18 X RAY DECT (DISPOSABLE) ×2 IMPLANT
SUCTION FRAZIER TIP 10 FR DISP (SUCTIONS) ×2 IMPLANT
SUT ABS MONO DBL WITH NDL 48IN (SUTURE) IMPLANT
SUT ETHILON 2 0 PS N (SUTURE) IMPLANT
SUT MON AB 2-0 SH 27 (SUTURE)
SUT MON AB 2-0 SH27 (SUTURE) IMPLANT
SUT NONABSORB MONO DB W/NDL 48 (SUTURE) IMPLANT
SUT PDS AB 3-0 SH 27 (SUTURE) IMPLANT
SUT VIC AB 0 CT1 36 (SUTURE) IMPLANT
SUT VIC AB 2-0 CT1 27 (SUTURE)
SUT VIC AB 2-0 CT1 TAPERPNT 27 (SUTURE) IMPLANT
SUT VIC AB 2-0 UR6 27 (SUTURE) ×2 IMPLANT
SYR BULB IRRIGATION 50ML (SYRINGE) ×2 IMPLANT
SYR CONTROL 10ML LL (SYRINGE) ×2 IMPLANT
SYRINGE 10CC LL (SYRINGE) ×2 IMPLANT
TRAY DSU PREP LF (CUSTOM PROCEDURE TRAY) ×2 IMPLANT
TUBE CONNECTING 12X1/4 (SUCTIONS) ×4 IMPLANT
WATER STERILE IRR 500ML POUR (IV SOLUTION) ×4 IMPLANT
YANKAUER SUCT BULB TIP NO VENT (SUCTIONS) IMPLANT

## 2013-10-31 NOTE — Anesthesia Postprocedure Evaluation (Signed)
  Anesthesia Post-op Note  Patient: Miranda Fleming  Procedure(s) Performed: Procedure(s) (LRB): LYNX PUBO-VAGINAL SLING (N/A)  Patient Location: PACU  Anesthesia Type: General  Level of Consciousness: awake and alert   Airway and Oxygen Therapy: Patient Spontanous Breathing  Post-op Pain: mild  Post-op Assessment: Post-op Vital signs reviewed, Patient's Cardiovascular Status Stable, Respiratory Function Stable, Patent Airway and No signs of Nausea or vomiting  Last Vitals:  Filed Vitals:   10/31/13 1026  BP: 113/66  Pulse:   Temp: 36.5 C  Resp: 11    Post-op Vital Signs: stable   Complications: No apparent anesthesia complications

## 2013-10-31 NOTE — Progress Notes (Signed)
Transferred to RCC #2 via stretcher. Pt alert, oriented x4.  Denies pain.  Pad mesh pants on.  Vaginal packing intact.  scd's on. SP incisions cdi. Pt able to transfer self to bed w/o assistance.  Oriented to room.  Call bell within reach.

## 2013-10-31 NOTE — H&P (Signed)
ncontinence   Active Problems Problems  1. Female stress incontinence (625.6)  History of Present Illness 47 yo married female, P 2-2-0 referred by Dr. Vincente Poli for further evaluation of stress incontinence x 80yrs. Worsening for 5 years. She is wearing 1-2 pads per day. She is s/p hysterectomy in 1994.   She has multiple episodes of incontinence with running, and possible with sexual activity.   Past Medical History Problems  1. History of No significant past medical history  Surgical History Problems  1. History of Hysterectomy  Current Meds 1. Calcium 600 TABS;  Therapy: (Recorded:18Nov2014) to Recorded 2. Estradiol 0.5 MG Oral Tablet;  Therapy: (Recorded:18Nov2014) to Recorded 3. Multiple Vitamin TABS;  Therapy: (Recorded:18Nov2014) to Recorded 4. Vitamin B-12 1000 MCG Oral Tablet;  Therapy: (Recorded:18Nov2014) to Recorded 5. Vitamin D3 5000 UNIT Oral Tablet;  Therapy: (Recorded:18Nov2014) to Recorded  Allergies Medication  1. Amoxicillin TABS  Family History Problems  1. Family history of malignant neoplasm of breast (V16.3) : Mother  Social History Problems    Alcohol use   Caffeine use (V49.89)   Father alive   Former smoker (V15.82)   Married   Mother alive   Number of children   Occupation  Review of Systems Genitourinary, constitutional, skin, eye, otolaryngeal, hematologic/lymphatic, cardiovascular, pulmonary, endocrine, musculoskeletal, gastrointestinal, neurological and psychiatric system(s) were reviewed and pertinent findings if present are noted.  Genitourinary: incontinence, but no urinary frequency, no urinary urgency, no nocturia, urinary stream does not start and stop, no incomplete emptying of bladder and initiating urination does not require straining.  Gastrointestinal: no nausea, no vomiting, no heartburn, no diarrhea and no constipation.  Constitutional: no fever, no night sweats and not feeling tired (fatigue).  Integumentary: no  new skin rashes or lesions and no pruritus.  Eyes: no blurred vision and no diplopia.  ENT: no sore throat and no sinus problems.  Hematologic/Lymphatic: no tendency to easily bruise and no swollen glands.  Cardiovascular: no chest pain and no leg swelling.  Respiratory: no shortness of breath and no cough.  Endocrine: no polydipsia.  Musculoskeletal: no back pain and no joint pain.  Neurological: no dizziness and no headache.  Psychiatric: no depression and no anxiety.    Vitals Vital Signs [Data Includes: Last 1 Day]  Recorded: 18Nov2014 02:03PM  Height: 5 ft 5 in Weight: 130 lb  BMI Calculated: 21.63 BSA Calculated: 1.65 Blood Pressure: 108 / 72 Heart Rate: 80  Physical Exam Constitutional: Well nourished and well developed . No acute distress.  ENT:. The ears and nose are normal in appearance.  Neck: The appearance of the neck is normal and no neck mass is present.  Pulmonary: No respiratory distress and normal respiratory rhythm and effort.  Cardiovascular:. No peripheral edema.  Abdomen: The abdomen is soft and nontender. No masses are palpated. No CVA tenderness. No hernias are palpable. No hepatosplenomegaly noted.  Genitourinary:  Chaperone Present: .  Examination of the external genitalia shows normal female external genitalia, no vulvar atrophy and no lesions. The urethra is normal in appearance, not tender and does not appear stenotic. There is no urethral mass. Urethral hypermobility is present. There is no urethral discharge. No periurethral cyst is identified. There is no urethral prolapse. Vaginal exam demonstrates no abnormalities, no atrophy, no discharge, no tenderness and the vaginal epithelium to be well estrogenized. No cystocele is identified. No enterocele is identified. No rectocele is identified. The cervix is is absent. The uterus is absent. The adnexa are palpably normal. The bladder is  normal on palpation, non tender and not distended. The anus is normal on  inspection. The perineum is normal on inspection.  Lymphatics: The femoral and inguinal nodes are not enlarged or tender.  Skin: Normal skin turgor, no visible rash and no visible skin lesions.  Neuro/Psych:. Mood and affect are appropriate.    Results/Data Urine [Data Includes: Last 1 Day]   18Nov2014  COLOR YELLOW   APPEARANCE CLEAR   SPECIFIC GRAVITY 1.020   pH 6.0   GLUCOSE NEG mg/dL  BILIRUBIN NEG   KETONE NEG mg/dL  BLOOD NEG   PROTEIN NEG mg/dL  UROBILINOGEN 0.2 mg/dL  NITRITE NEG   LEUKOCYTE ESTERASE NEG    Procedure Gaynell Face test: + hypermobility. PVR - 100cc and instilled 300cc of sterile water.     Assessment Assessed  1. Female stress incontinence (625.6)  19 yrs of stress urinary incontinence, with worsening in the last 5 yrs. 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 Exam shows hypermobile urethra, with + Marshall test in the standing position. We have discussed non-surgical alternatives for her stress incontinence, including medications and PT. However, her insurance will change in January, and she wants to be sure she is repaired in time. She is a good candidate for [place] pubovaginal sling, and will have her surgery at [facility] on a Monday to make use of the overnight center. She will have packing, and foley for 24 hrs, and pyridium 200mg  pre-op.   Plan Female stress incontinence  1. [facility] Test; Status:Complete;   Done: 18Nov2014  Lynx pubovaginal sling.   Discussion/Summary cc: Marcelle Overlie, MD     Signatures Electronically signed by : Jethro Bolus, M.D.; Sep 27 2013  3:41PM EST

## 2013-10-31 NOTE — Op Note (Signed)
Pre-operative diagnosis : Stress urinary incontinence  Postoperative diagnosis:  Same  Operation:  [employer] Rx pubovaginal sling  Surgeon:  S. Patsi Sears, MD  First assistant:  None  Anesthesia:  General LMA  Preparation:  After appropriate preanesthesia, the patient was brought to the operative room, placed on the operating table in the dorsal supine position where general LMA anesthesia was introduced. She was replaced in the dorsal lithotomy position with pubis was prepped with Betadine solution and draped in usual fashion. Her history was reviewed. Latex allergy was noted. Armband was double checked.  Review history:    1. Female stress incontinence (625.6) 47 yo female with :  18 yrs of stress urinary incontinence, with worsening in the last 5 yrs. 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 Exam shows hypermobile urethra, with + Marshall test in the standing position. We have discussed non-surgical alternatives for her stress incontinence, including medications and PT. However, her insurance will change in January, and she wants to be sure she is repaired in time. She is a good candidate for [place] pubovaginal sling, and will have her surgery at [facility] on a Monday to make use of the overnight center. She will have packing, and foley for 24 hrs, and pyridium 200mg  pre-op.    Statement of  Likelihood of Success: Excellent. TIME-OUT observed.:  Procedure:  Posterior weighted speculum was placed. Nonlatex Foley catheter was placed the bladder was drained of Pyridium colored fluid. The [facility] retractor was placed. 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 There was no enterocele the rectocele. The urethra appeared hypermobile as noted in the office.  Using a marking pen, a 0.2 fingerbreadths above the pubic tubercle and 2 fingerbreadths lateral was marked bilaterally. Transvaginally, the midline of the urethra is marked, and the bladder neck was  also marked. Using separate needles, Marcaine 0.5% with epinephrine 1-2 and thousand was injected in the suprapubic wounds, and in the mid urethral space. Using a 10 blade, separate 1/2 cm incisions are made suprapubically, 2 fingerbreadths above the pubic tubercle and 2 fingerbreadths lateral to the midline. Using a 15 blade, a midline incision is made over the urethra, and the midline, and using the Strully scissors, dissection is carried to the pubis bilaterally. Minimal bleeding is noted. The bladder is drained of urine, and the [employer] Rx needles are placed bilaterally. Cystoscopy is accomplished with both the 12 lens and the 70 lens. There is no evidence of needles within the bladder. The bladder neck is mobile with movement of the needles. There is cold Pyridium colored urine jets seen emitting from both ureteral orifices.  The bladder is again drained of fluid but the Foley catheter, and the [place] sling is placed transvaginally, and brought into the retropubic space. A right angle clamp was used for tensioning. The sleeves are brought retropubically. The blue Is cut transvaginally, and the plastic sleeves removed. With continuing open right angle clamp for tensioning, the mesh arms were then cut subcutaneously in the retropubic space. The mesh is smooth and loose against the urethra. The vaginal incision is closed with running 2-0 Vicryl suture. The suprapubic incisions were closed with Dermabond. Foley catheter was left to straight drainage. The patient received IV Tylenol, as well as IV Toradol. She is awakened, and taken to recovery room in excellent condition, after vaginal packing with Estrace is applied.

## 2013-10-31 NOTE — Anesthesia Procedure Notes (Signed)
Procedure Name: LMA Insertion Date/Time: 10/31/2013 9:15 AM Performed by: Jessica Priest Pre-anesthesia Checklist: Patient identified, Emergency Drugs available, Suction available and Patient being monitored Patient Re-evaluated:Patient Re-evaluated prior to inductionOxygen Delivery Method: Circle System Utilized Preoxygenation: Pre-oxygenation with 100% oxygen Intubation Type: IV induction Ventilation: Mask ventilation without difficulty LMA: LMA inserted LMA Size: 4.0 Number of attempts: 1 Airway Equipment and Method: bite block Placement Confirmation: positive ETCO2 Tube secured with: Tape Dental Injury: Teeth and Oropharynx as per pre-operative assessment

## 2013-10-31 NOTE — Anesthesia Preprocedure Evaluation (Signed)
Anesthesia Evaluation  Patient identified by MRN, date of birth, ID band Patient awake    Reviewed: Allergy & Precautions, H&P , NPO status , Patient's Chart, lab work & pertinent test results  Airway Mallampati: II TM Distance: >3 FB Neck ROM: Full    Dental no notable dental hx.    Pulmonary former smoker,  breath sounds clear to auscultation  Pulmonary exam normal       Cardiovascular negative cardio ROS  Rhythm:Regular Rate:Normal     Neuro/Psych negative neurological ROS  negative psych ROS   GI/Hepatic negative GI ROS, Neg liver ROS,   Endo/Other  negative endocrine ROS  Renal/GU negative Renal ROS  negative genitourinary   Musculoskeletal negative musculoskeletal ROS (+)   Abdominal   Peds negative pediatric ROS (+)  Hematology negative hematology ROS (+)   Anesthesia Other Findings   Reproductive/Obstetrics negative OB ROS                           Anesthesia Physical Anesthesia Plan  ASA: I  Anesthesia Plan: General   Post-op Pain Management:    Induction: Intravenous  Airway Management Planned: Oral ETT and LMA  Additional Equipment:   Intra-op Plan:   Post-operative Plan: Extubation in OR  Informed Consent: I have reviewed the patients History and Physical, chart, labs and discussed the procedure including the risks, benefits and alternatives for the proposed anesthesia with the patient or authorized representative who has indicated his/her understanding and acceptance.   Dental advisory given  Plan Discussed with: CRNA and Surgeon  Anesthesia Plan Comments:         Anesthesia Quick Evaluation

## 2013-10-31 NOTE — Transfer of Care (Signed)
Immediate Anesthesia Transfer of Care Note  Patient: Miranda Fleming  Procedure(s) Performed: Procedure(s) (LRB): LYNX PUBO-VAGINAL SLING (N/A)  Patient Location: PACU  Anesthesia Type: General  Level of Consciousness: awake, sedated, patient cooperative and responds to stimulation  Airway & Oxygen Therapy: Patient Spontanous Breathing and Patient connected to face mask oxygen  Post-op Assessment: Report given to PACU RN, Post -op Vital signs reviewed and stable and Patient moving all extremities  Post vital signs: Reviewed and stable  Complications: No apparent anesthesia complications

## 2013-10-31 NOTE — Interval H&P Note (Signed)
History and Physical Interval Note:  10/31/2013 8:43 AM  [name]  has presented today for surgery, with the diagnosis of STRESS INCONTINENCE   The various methods of treatment have been discussed with the patient and family. After consideration of risks, benefits and other options for treatment, the patient has consented to  Procedure(s): LYNX PUBO-VAGINAL SLING (N/A) as a surgical intervention .  The patient's history has been reviewed, patient examined, no change in status, stable for surgery.  Fleming have reviewed the patient's chart and labs.  Questions were answered to the patient's satisfaction.     Miranda Fleming

## 2013-11-01 ENCOUNTER — Encounter (HOSPITAL_BASED_OUTPATIENT_CLINIC_OR_DEPARTMENT_OTHER): Payer: Self-pay | Admitting: Urology

## 2013-11-01 MED ORDER — SENNA 8.6 MG PO TABS
ORAL_TABLET | ORAL | Status: AC
  Filled 2013-11-01: qty 1

## 2013-11-01 MED ORDER — CIPROFLOXACIN HCL 250 MG PO TABS
ORAL_TABLET | ORAL | Status: AC
  Filled 2013-11-01: qty 1

## 2013-11-01 MED ORDER — OXYCODONE-ACETAMINOPHEN 5-325 MG PO TABS
ORAL_TABLET | ORAL | Status: AC
  Filled 2013-11-01: qty 2

## 2013-11-01 NOTE — Discharge Summary (Signed)
  Physician Discharge Summary  Patient ID: Miranda Fleming MRN: [id number] DOB/AGE: 07-29-1966 47 y.o.  Admit date: 10/31/2013 Discharge date: 11/01/2013  Admission Diagnoses: STRESS INCONTINENCE   Discharge Diagnoses:  Active Problems:   Stress incontinence in female   Discharged Condition: improved  Hospital Course:  surgery  Consults: none  Significant Diagnostic Studies: No results found.  Treatments: surgery: :Lynx pubovaginal sling  Discharge Exam: Blood pressure 102/67, pulse 79, temperature 97.3 F (36.3 C), temperature source Oral, resp. rate 18, height 5\' 5"  (1.651 m), weight 58.968 kg (130 lb), SpO2 94.00%. General appearance: alert and cooperative  Disposition:   Discharge Orders   Future Orders Complete By Expires   Discharge patient  As directed    Discontinue IV  As directed        Medication List    TAKE these medications       CALCIUM 600 + D PO  Take 2 tablets by mouth daily.     ciprofloxacin 500 MG tablet  Commonly known as:  CIPRO  Take 1 tablet (500 mg total) by mouth 2 (two) times daily.     estradiol 0.5 MG tablet  Commonly known as:  ESTRACE  Take 0.5 mg by mouth daily.     multivitamin tablet  Take 1 tablet by mouth daily.     oxyCODONE-acetaminophen 5-325 MG per tablet  Commonly known as:  ROXICET  Take 1 tablet by mouth every 4 (four) hours as needed for severe pain.     vitamin B-12 1000 MCG tablet  Commonly known as:  CYANOCOBALAMIN  Take 2,000 mcg by mouth daily.     Vitamin D3 5000 UNITS Caps  Take 1 capsule by mouth daily.      ASK your doctor about these medications       mometasone 50 MCG/ACT nasal spray  Commonly known as:  NASONEX  Place 2 sprays into the nose daily.           Follow-up Information   Follow up with Jethro Bolus I, MD. (per appointment)    Specialty:  Urology   Contact information:   [address] [place]  [address] [phone]       Follow up with Jethro Bolus I, MD. (per appointment)    Specialty:  Urology   Contact information:   [address] [place] [address] [phone]     Reduce pain med to 1/2 tab q 6 h Ok to shower No intercourse for 3-4 weeks Drive this weekend Flat treadmill starting this weekend.   SignedJethro Bolus I 11/01/2013, 9:05 AM

## 2013-11-01 NOTE — Progress Notes (Signed)
Dr.Tannenbaum in to see patient.  Per surgeon patient does not need to void a second time.  He went over specific instructions and answered questions for the patient and her husband.

## 2013-11-01 NOTE — Progress Notes (Signed)
Urology Progress Note  1 Day Post-Op . + void. Foley and packing out. Scrips given.   Subjective:     No acute urologic events overnight. Ambulation:   positive Flatus:    positive Bowel movement  negative  Pain: minimal  Objective:  Blood pressure 102/67, pulse 79, temperature 97.3 F (36.3 C), temperature source Oral, resp. rate 18, height 5\' 5"  (1.651 m), weight 58.968 kg (130 lb), SpO2 94.00%.  Physical Exam:  General:  No acute distress, awake Extremities: extremities normal, atraumatic, no cyanosis or edema Genitourinary:  Normal s-p area Foley:out    I/O last 3 completed shifts: In: 4420 [P.O.:2320; I.V.:2100] Out: 3525 [Urine:3525]  Recent Labs     10/31/13  0759  HGB  13.6    No results found for this basename: NA, K, CL, CO2, BUN, CREATININE, CALCIUM, MAGNESIUM, GFRNONAA, GFRAA,  in the last 72 hours   No results found for this basename: PT, INR, APTT,  in the last 72 hours   No components found with this basename: ABG,   Assessment/Plan:   Wound care discussed.

## 2014-08-25 ENCOUNTER — Other Ambulatory Visit: Payer: Self-pay

## 2014-09-05 ENCOUNTER — Other Ambulatory Visit: Payer: Self-pay | Admitting: Obstetrics and Gynecology

## 2014-09-06 LAB — CYTOLOGY - PAP

## 2014-09-11 ENCOUNTER — Encounter (HOSPITAL_BASED_OUTPATIENT_CLINIC_OR_DEPARTMENT_OTHER): Payer: Self-pay | Admitting: Urology

## 2015-10-09 ENCOUNTER — Ambulatory Visit (HOSPITAL_COMMUNITY)
Admission: RE | Admit: 2015-10-09 | Discharge: 2015-10-09 | Disposition: A | Discharge status: Home / Self Care | Payer: BLUE CROSS/BLUE SHIELD | Source: Ambulatory Visit | Attending: Cardiovascular Disease | Admitting: Cardiovascular Disease

## 2015-10-09 ENCOUNTER — Encounter: Payer: Self-pay | Admitting: Cardiovascular Disease

## 2015-10-09 ENCOUNTER — Ambulatory Visit (INDEPENDENT_AMBULATORY_CARE_PROVIDER_SITE_OTHER): Payer: BLUE CROSS/BLUE SHIELD | Admitting: Cardiovascular Disease

## 2015-10-09 ENCOUNTER — Other Ambulatory Visit (HOSPITAL_COMMUNITY): Payer: Self-pay | Admitting: Orthopedic Surgery

## 2015-10-09 ENCOUNTER — Telehealth: Payer: Self-pay | Admitting: Cardiovascular Disease

## 2015-10-09 DIAGNOSIS — I82401 Acute embolism and thrombosis of unspecified deep veins of right lower extremity: Secondary | ICD-10-CM

## 2015-10-09 DIAGNOSIS — I82491 Acute embolism and thrombosis of other specified deep vein of right lower extremity: Secondary | ICD-10-CM | POA: Insufficient documentation

## 2015-10-09 DIAGNOSIS — M79604 Pain in right leg: Secondary | ICD-10-CM | POA: Insufficient documentation

## 2015-10-09 DIAGNOSIS — I82409 Acute embolism and thrombosis of unspecified deep veins of unspecified lower extremity: Secondary | ICD-10-CM | POA: Insufficient documentation

## 2015-10-09 MED ORDER — APIXABAN 5 MG PO TABS: 5.0000 mg | ORAL_TABLET | Freq: Two times a day (BID) | ORAL | Status: DC

## 2015-10-09 NOTE — Patient Instructions (Signed)
Medication Instructions:  Your physician has recommended you make the following change in your medication:  1) START Eliquis 5 mg by mouth TWICE daily   Labwork: Your physician recommends that you return for lab work in: BM[facility] - Today The lab can be found on the FIRST FLOOR of out building in [address]   Testing/Procedures: Your physician has requested that you have a lower extremity veinous duplex. During this test, exercise and ultrasound are used to evaluate arterial blood flow in the legs. Allow one hour for this exam. There are no restrictions or special instructions.   Follow-Up: With Dr. Allyson SabalBerry pending results of test.  Any Other Special Instructions Will Be Listed Below (If Applicable).     If you need a refill on your cardiac medications before your next appointment, please call your pharmacy.

## 2015-10-09 NOTE — Telephone Encounter (Signed)
Pt scheduled to see Dr. Allyson SabalBerry 11/29 @ 2:15pm to discuss positive doppler.

## 2015-10-09 NOTE — Assessment & Plan Note (Signed)
Miranda CornfieldStephanie presents today as an add-on for acute DVT of right lower extremity. She had SI joint fusion by Dr. Shon BatonBrooks on 09/28/15. She developed acute pain in her right calf with sensitivity to touch on Thanksgiving day. She was presented today for venous duplex which revealed paired peroneal vein DVT. Based on her symptoms I'm going to begin her on Eliquis 5 mg by mouth twice a day. She has no bleeding risk. We will get follow-up venous Doppler's in 3 months and I will see her back after that for further evaluation.

## 2015-10-09 NOTE — Progress Notes (Signed)
10/09/2015 Bishop DublinStephanie Lynn Fleming   01/18/1966  [id number]  Primary Physician No primary care provider on file. Primary Cardiologist: Runell GessJonathan J. 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 She was referred by Dr. Shon BatonBrooks, orthopedic surgeon, for treatment of acute right lower extremity DVT. She has no chronic risk factors. She had SI joint fusion by Dr. Shon BatonBrooks 09/28/15. She developed right calf discomfort on thanks giving day with some sensitivity to touch and coolness to touch as well. Dopplers performed in our office today showed paired peroneal vein DVT.   Current Outpatient Prescriptions  Medication Sig Dispense Refill  . Calcium Carb-Cholecalciferol (CALCIUM 600 + D PO) Take 2 tablets by mouth daily.    . Cholecalciferol (VITAMIN D3) 5000 UNITS CAPS Take 1 capsule by mouth daily.    Marland Kitchen. estradiol (ESTRACE) 0.5 MG tablet Take 0.5 mg by mouth daily.    . Multiple Vitamin (MULTIVITAMIN) tablet Take 1 tablet by mouth daily.    Marland Kitchen. oxyCODONE-acetaminophen (ROXICET) 5-325 MG per tablet Take 1 tablet by mouth every 4 (four) hours as needed for severe pain. 30 tablet 0   No current facility-administered medications for this visit.    Allergies  Allergen Reactions  . Amoxicillin Rash  . Latex Rash    Social History   Social History  . Marital Status: Married    Spouse Name: N/A  . Number of Children: N/A  . Years of Education: N/A   Occupational History  . Not on file.   Social History Main Topics  . Smoking status: Former Smoker -- 1.50 packs/day for 20 years    Types: Cigarettes    Quit date: 10/26/2000  . Smokeless tobacco: Never Used  . Alcohol Use: 2.0 oz/week    4 drink(s) per week  . Drug Use: Not on file  . Sexual Activity: Not on file   Other Topics Concern  . Not on file   Social History Narrative     Review of  Systems: General: negative for chills, fever, night sweats or weight changes.  Cardiovascular: negative for chest pain, dyspnea on exertion, edema, orthopnea, palpitations, paroxysmal nocturnal dyspnea or shortness of breath Dermatological: negative for rash Respiratory: negative for cough or wheezing Urologic: negative for hematuria Abdominal: negative for nausea, vomiting, diarrhea, bright red blood per rectum, melena, or hematemesis Neurologic: negative for visual changes, syncope, or dizziness All other systems reviewed and are otherwise negative except as noted above.    Blood pressure 122/84, pulse 99, height 5\' 5"  (1.651 m), weight 114 lb (51.71 kg).  General appearance: alert and no distress Neck: no adenopathy, no carotid bruit, no JVD, supple, symmetrical, trachea midline and thyroid not enlarged, symmetric, no tenderness/mass/nodules Lungs: clear to auscultation bilaterally Heart: regular rate and rhythm, S1, S2 normal, no murmur, click, rub or gallop Extremities: extremities normal, atraumatic, no cyanosis or edema  EKG not performed today  ASSESSMENT AND PLAN:   DVT of lower limb, acute (HCC) Miranda CornfieldStephanie presents today as an add-on for acute DVT of right lower extremity. She had SI joint fusion by Dr. Shon BatonBrooks on 09/28/15. She developed acute pain in her right calf with sensitivity to touch on Thanksgiving day. She was presented today for venous duplex which revealed paired peroneal vein DVT. Based on her symptoms I'm going to begin her on Eliquis 5 mg by mouth twice a day. She has no bleeding  risk. We will get follow-up venous Doppler's in 3 months and I will see her back after that for further evaluation.      Runell Gess MD FACP,FACC,FAHA, [facility] 10/09/2015 2:27 PM

## 2015-10-10 LAB — BASIC METABOLIC PANEL
BUN: 11 mg/dL (ref 7–25)
CHLORIDE: 98 mmol/L (ref 98–110)
CO2: 27 mmol/L (ref 20–31)
Calcium: 9.8 mg/dL (ref 8.6–10.2)
Creat: 0.75 mg/dL (ref 0.50–1.10)
Glucose, Bld: 102 mg/dL — ABNORMAL HIGH (ref 65–99)
Potassium: 4.6 mmol/L (ref 3.5–5.3)
Sodium: 133 mmol/L — ABNORMAL LOW (ref 135–146)

## 2015-10-24 ENCOUNTER — Telehealth: Payer: Self-pay | Admitting: Cardiovascular Disease

## 2015-10-24 DIAGNOSIS — R002 Palpitations: Secondary | ICD-10-CM

## 2015-10-24 NOTE — Telephone Encounter (Signed)
2 week event monitor, follow-up with mid-level provider

## 2015-10-24 NOTE — Telephone Encounter (Signed)
Pt states she has had a "fluttery" feeling in chest, she also describes this as heavy beating in chest. She had this once this morning, once last night at bedtime. ~45-60 mins duration. She checked radial pulse this morning, noted 81, even, strong pulse. 75 when rechecked some time later. Notes anxious feeling accompanies this.  Previously, she had this ~2 weeks ago, just a day or so before being seen on 11/29 for DVT evaluation.  She denies CP, dyspnea.  Has been on Eliquis & compliant since prescribed on 11/29.  Pt aware if CP or dyspnea to go to ED.  Pt aware I will route to Dr. Allyson SabalBerry for advice - discussed possibility of return visit w/ extender or monitor.

## 2015-10-24 NOTE — Telephone Encounter (Signed)
Recommendations discussed w/ patient. She will see me or other triage RN for monitor placement tomorrow and is informed we will call to set up MLP visit.

## 2015-10-24 NOTE — Telephone Encounter (Signed)
Please call,feeling anxious in her chest. She had it last night and this morning.

## 2015-10-25 ENCOUNTER — Encounter (INDEPENDENT_AMBULATORY_CARE_PROVIDER_SITE_OTHER): Payer: BLUE CROSS/BLUE SHIELD

## 2015-10-25 DIAGNOSIS — R002 Palpitations: Secondary | ICD-10-CM | POA: Diagnosis not present

## 2015-11-06 ENCOUNTER — Telehealth: Payer: Self-pay | Admitting: Cardiovascular Disease

## 2015-11-06 NOTE — Telephone Encounter (Signed)
Please call,heart beating rapid at night,can not rest. She wonder if her medicine might need to be changed?

## 2015-11-06 NOTE — Telephone Encounter (Signed)
Cardionet has not seen or have had any reported event

## 2015-11-06 NOTE — Telephone Encounter (Signed)
Not a side effect of Eliquis

## 2015-11-06 NOTE — Telephone Encounter (Signed)
[name] would like to let Dr. Allyson SabalBerry now she has tingling around her heart, gets up every night and has to pace, which is difficult with crutches and she gets short of breath.  Also, complains of her heart racing.  Told patient I would share the information she has given be with Dr. Allyson SabalBerry and told her that Cardionet has not seen any incidences of a rapid heart rate. Told her that Belenda CruiseKristin our pharmacist said her symptoms would not be from the Eilquis. She also c/o feeling anxious and short of breath. Wonders what she can do to feel better.

## 2015-11-06 NOTE — Telephone Encounter (Signed)
Patient calls this morning and reports having hard, fast heart rate at night and also as we speak Has 2 week event monitor in place.  Has pushed button to record with episodes Feels short of breath but is talking in complete sentences. Has been on Eliquis for one month; wonders if this is a side affect. She does not know her heart rate and does not monitor her BP [facility] Will route to Dr. Allyson SabalBerry Will call Cardionet to see if there has been any events

## 2015-11-07 NOTE — Telephone Encounter (Signed)
Appointment scheduled with Miranda Fleming at 0900 11/13/15 Discussed with patient and she agree's

## 2015-11-07 NOTE — Telephone Encounter (Signed)
Please arrange for fer to see a MLP to eval

## 2015-11-13 ENCOUNTER — Ambulatory Visit (HOSPITAL_COMMUNITY)
Admission: RE | Admit: 2015-11-13 | Discharge: 2015-11-13 | Disposition: A | Discharge status: Home / Self Care | Payer: BLUE CROSS/BLUE SHIELD | Source: Ambulatory Visit | Attending: Physician Assistant | Admitting: Physician Assistant

## 2015-11-13 ENCOUNTER — Encounter: Payer: Self-pay | Admitting: Physician Assistant

## 2015-11-13 ENCOUNTER — Ambulatory Visit (INDEPENDENT_AMBULATORY_CARE_PROVIDER_SITE_OTHER): Payer: BLUE CROSS/BLUE SHIELD

## 2015-11-13 ENCOUNTER — Other Ambulatory Visit: Payer: Self-pay | Admitting: Physician Assistant

## 2015-11-13 ENCOUNTER — Ambulatory Visit (INDEPENDENT_AMBULATORY_CARE_PROVIDER_SITE_OTHER): Payer: BLUE CROSS/BLUE SHIELD | Admitting: Physician Assistant

## 2015-11-13 VITALS — BP 128/90 | HR 105 | Ht 65.0 in | Wt 113.9 lb

## 2015-11-13 DIAGNOSIS — I739 Peripheral vascular disease, unspecified: Secondary | ICD-10-CM | POA: Insufficient documentation

## 2015-11-13 DIAGNOSIS — R5383 Other fatigue: Secondary | ICD-10-CM

## 2015-11-13 DIAGNOSIS — R000 Tachycardia, unspecified: Secondary | ICD-10-CM | POA: Diagnosis not present

## 2015-11-13 DIAGNOSIS — R002 Palpitations: Secondary | ICD-10-CM

## 2015-11-13 DIAGNOSIS — I82401 Acute embolism and thrombosis of unspecified deep veins of right lower extremity: Secondary | ICD-10-CM | POA: Diagnosis not present

## 2015-11-13 LAB — CBC
HEMATOCRIT: 35.1 % — AB (ref 36.0–46.0)
Hemoglobin: 12.3 g/dL (ref 12.0–15.0)
MCH: 34.4 pg — ABNORMAL HIGH (ref 26.0–34.0)
MCHC: 35 g/dL (ref 30.0–36.0)
MCV: 98 fL (ref 78.0–100.0)
MPV: 8 fL — ABNORMAL LOW (ref 8.6–12.4)
Platelets: 169 10*3/uL (ref 150–400)
RBC: 3.58 MIL/uL — AB (ref 3.87–5.11)
RDW: 13.8 % (ref 11.5–15.5)
WBC: 7.4 10*3/uL (ref 4.0–10.5)

## 2015-11-13 LAB — TSH: TSH: 3.332 u[IU]/mL (ref 0.350–4.500)

## 2015-11-13 MED ORDER — METOPROLOL TARTRATE 25 MG PO TABS: 12.5000 mg | ORAL_TABLET | Freq: Two times a day (BID) | ORAL | Status: DC

## 2015-11-13 NOTE — Patient Instructions (Addendum)
Your physician recommends that you return for lab work in: TODAY AT [facility] ON THE FIRST FLOOR  Your physician has requested that you have a lower extremity arterial exercise duplex TODAY . During this test, exercise and ultrasound are used to evaluate arterial blood flow in the legs. Allow one hour for this exam. There are no restrictions or special instructions.  Your physician has recommended you make the following change in your medication: START METOPROLOL 25 MG TABLETS - TAKE 1/2 TABLET TWICE DAILY.   KEEP A CALORIE COUNT TO MAKE SURE GET AT LEAST 1500 CALORIES PER DAY.  Your physician recommends that you schedule a follow-up appointment in: ONE MONTH WITH RHONDA Lower BurrellBARRETT, [place]

## 2015-11-13 NOTE — Progress Notes (Signed)
Cardiology Office Note   Date:  11/13/2015   ID:  Miranda Fleming, DOB [birth year], MRN [id number]  PCP:  Jeani Hawking, MD  Cardiologist:  Dr William Hamburger, PA-C   Chief Complaint  Patient presents with  . Tachycardia    History of Present Illness: Miranda Fleming is a 50 y.o. female with a history of DVT dx 11/29, on Eliquis, palpitations, s/p Cardionet monitor and back problems requiring SI fusion.  Miranda Fleming presents for follow-up of her DVT and monitor.    1. DVT: The pain in her right leg has improved and her calf is not as cold as it was. However, her right foot is still very cold and it still swells at times. She has taken the Eliquis twice a day consistently and has not missed any doses.  2. Palpitations: She has regular episodes of palpitations with the most recent one being this morning. She will be sitting still and then all of a sudden get symptomatic with becoming short of breath, weak, a little lightheaded and feeling her heart race. She also feels her heart skip at times. Her symptoms are not consistently orthostatic in nature.  Her back is starting to improve since the surgery and she is doing better from that standpoint. She thought she had gained some weight but is actually down a pound on our scales. She states she drinks plenty of water and feels that she is eating a healthy diet. She is pleased that she has been able to increase her activity since the surgery. She was exercising 6 days a week prior to the surgery and is looking forward to increasing her activity further.  She knows that her blood pressure is high today and normally the lower number is in the 60s.   Past Medical History  Diagnosis Date  . SUI (stress urinary incontinence, female)   . Wears glasses   . DVT, lower extremity, distal, acute (HCC)     paired right peroneal veins    Past Surgical History  Procedure Laterality Date  . Exploratory  laparotomy w/  left salpingoophorectomy  01-25-2001  . Abdominal hysterectomy  1995    W/ RIGHT SALPINGOOPHORECTOMY  . Laparoscopy's and laparotomy's for persistant ovarian cyst  X 5  1987;  1989;  1990;  1993;  1994  . Pubovaginal sling N/A 10/31/2013    Procedure: Jamelle Rushing;  Surgeon: Kathi Ludwig, MD;  [facility];  Service: Urology;  Laterality: N/A;    Current Outpatient Prescriptions  Medication Sig Dispense Refill  . apixaban (ELIQUIS) 5 MG TABS tablet Take 1 tablet (5 mg total) by mouth 2 (two) times daily. 180 tablet 3  . Calcium Carb-Cholecalciferol (CALCIUM 600 + D PO) Take 2 tablets by mouth daily.    . Cholecalciferol (VITAMIN D3) 5000 UNITS CAPS Take 1 capsule by mouth daily.    Marland Kitchen estradiol (ESTRACE) 0.5 MG tablet Take 0.5 mg by mouth daily.    . Multiple Vitamin (MULTIVITAMIN) tablet Take 1 tablet by mouth daily.    Marland Kitchen oxyCODONE-acetaminophen (ROXICET) 5-325 MG per tablet Take 1 tablet by mouth every 4 (four) hours as needed for severe pain. 30 tablet 0   No current facility-administered medications for this visit.    Allergies:   Amoxicillin and Latex    Social History:  The patient  reports that she quit smoking about 15 years ago. Her smoking use included Cigarettes. She has a 30 pack-year  smoking history. She has never used smokeless tobacco. She reports that she drinks about 2.0 oz of alcohol per week.   Family History:  The patient's family history includes Breast cancer in her mother; Colon cancer in her paternal grandfather; Diabetes in her maternal aunt; Osteoporosis in her mother; Ovarian cancer in her maternal grandmother and mother.    ROS:  Please see the history of present illness. All other systems are reviewed and negative.    PHYSICAL EXAM: VS:  BP 128/90 mmHg  Pulse 105  Ht 5\' 5"  (1.651 m)  Wt 113 lb 14.4 oz (51.665 kg)  BMI 18.95 kg/m2 , BMI Body mass index is 18.95 kg/(m^2). GEN: Well nourished,  thin, female in no acute distress HEENT: normal for age  Neck: no JVD, no carotid bruit, no masses Cardiac: RRR; no murmur, no rubs, or gallops Respiratory:  clear to auscultation bilaterally, normal work of breathing GI: soft, nontender, nondistended, + BS MS: no deformity or atrophy; no edema; distal pulses are 2+ in all 4 extremities; right calf is cool to touch but has no cords. Right foot is cold and has no edema. Distal pulses are intact and capillary refill is normal-slightly delayed and some toes. No evidence of distal embolization is present Skin: warm and dry, no rash Neuro:  Strength and sensation are intact Psych: euthymic mood, full affect   EKG:  EKG is ordered today. The ekg ordered today demonstrates sinus tachycardia, rate 105 with no acute changes and normal intervals.   Recent Labs: 10/09/2015: BUN 11; Creat 0.75; Potassium 4.6; Sodium 133*    Lipid Panel    Component Value Date/Time   CHOL 163 07/01/2012 1536   TRIG 98 07/01/2012 1536   HDL 74 07/01/2012 1536   CHOLHDL 2.2 07/01/2012 1536   VLDL 20 07/01/2012 1536   LDLCALC 69 07/01/2012 1536     Wt Readings from Last 3 Encounters:  11/13/15 113 lb 14.4 oz (51.665 kg)  10/09/15 114 lb (51.71 kg)  10/31/13 130 lb (58.968 kg)     Other studies Reviewed: Additional studies/ records that were reviewed today include: Previous office notes and [facility].  ASSESSMENT AND PLAN:  1.  DVT: She is been compliant with the Eliquis for 5 weeks now. Her symptoms have improved somewhat but not as much as would be expected. We will recheck lower extremity Dopplers to make sure that the DVT is resolving. If it does not, further testing may be warranted for hypercoagulable state and possibly to rule out a PE.  2. Palpitations: She had frequent PVCs. Sometimes she would get symptomatic with a heart rate of 100 and in one instance she was in sinus rhythm with an atrial couplet. At other times she would have a lower level  sinus tachycardia with a heart rate of 90-100. However, there were several episodes with her heart rate being elevated up to 168. Other episodes were 138 BPM and 144 BPM. She was symptomatic with these. The one episode was not marked with symptoms, she stated she had them but the monitor was not with her and she could not get to it in time. These episodes are short and self limiting, but they bother her and are occurring on a regular basis.  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 She will be started on a low-dose of metoprolol and see how this is tolerated. However, I am also concerned about other underlying causes. To that end, we will  check a TSH and a CBC. If those are within normal limits, consider 24-hour urine for metanephrines and catecholamines. She is also requested to keep a calorie count. She says that she eats well but her BMI is less than 20 and I'm concerned that she is not eating enough. She is encouraged to maintain [phone] calories a day and drink plenty of liquids.  If all testing is negative, she may be referred to EP or discuss starting either ivabradine with M.D.   Current medicines are reviewed at length with the patient today.  The patient does not have concerns regarding medicines.  The following changes have been made:  Low-dose metoprolol  Labs/ tests ordered today include:   Orders Placed This Encounter  Procedures  . CBC  . TSH  . EKG 12-Lead     Disposition:   FU with me or Dr. Allyson Sabal in a month  Signed, Leanna Battles  11/13/2015 9:44 AM    [facility] [address] [place], [place], [place]  [address] Phone: [phone]; Fax: [phone]

## 2015-11-13 NOTE — Addendum Note (Signed)
Addended by: Ronnell GuadalajaraLASSITER, Charese Abundis A on: 11/13/2015 09:50 AM   Modules accepted: Orders

## 2015-11-13 NOTE — Addendum Note (Signed)
Addended by: Neta EhlersRUITT, ANGELA M on: 11/13/2015 10:23 AM   Modules accepted: Orders

## 2015-11-23 ENCOUNTER — Ambulatory Visit (INDEPENDENT_AMBULATORY_CARE_PROVIDER_SITE_OTHER): Payer: BLUE CROSS/BLUE SHIELD | Admitting: Physician Assistant

## 2015-11-23 ENCOUNTER — Encounter: Payer: Self-pay | Admitting: Physician Assistant

## 2015-11-23 VITALS — BP 116/82 | HR 65 | Ht 65.0 in | Wt 113.1 lb

## 2015-11-23 DIAGNOSIS — R002 Palpitations: Secondary | ICD-10-CM | POA: Diagnosis not present

## 2015-11-23 HISTORY — DX: Palpitations: R00.2

## 2015-11-23 MED ORDER — RIVAROXABAN 20 MG PO TABS: 20.0000 mg | ORAL_TABLET | Freq: Every day | ORAL | Status: DC

## 2015-11-23 MED ORDER — METOPROLOL TARTRATE 25 MG PO TABS: 12.5000 mg | ORAL_TABLET | Freq: Three times a day (TID) | ORAL | Status: DC

## 2015-11-23 NOTE — Patient Instructions (Addendum)
Your physician has recommended you make the following change in your medication:  1.) the metoprolol has been increased to 1/2 tablet three (3) times a day.  2.) the Eliquis will be placed on hold for 10 days and switched to Xarelto 20 mg  3.). Start the Xarelto at  least12 hours after your last dose of Eliquis. If your symptoms does not improve STOP the Xarelto and return to the Eliquis. Wait 24 hours after last dose of Xarelto before [facility].  Your physician recommends that you schedule a follow-up appointment with Dr Allyson SabalBerry in 2 months.

## 2015-11-23 NOTE — Progress Notes (Signed)
Cardiology Office Note   Date:  11/23/2015   ID:  Miranda Fleming, DOB 10/15/1966, MRN [id number]  PCP:  Jeani Hawking, MD  Cardiologist:  Dr William Hamburger, PA-C    History of Present Illness: Miranda Fleming is a 50 y.o. female with a history of DVT dx 10/09/2015 (2 weeks after back surgery), on Eliquis, palpitations, s/p Cardionet monitor and back problems requiring SI fusion.  Miranda Fleming presents for follow-up of her CardioNet monitor and further evaluation.  Her right leg has normalized and she is pleased to report that she is having no further symptoms. She was greatly reassured by the follow-up ultrasound showing no DVT.  She has continued to have regular episodes of tachycardia. On the CardioNet monitor, she had multiple episodes of palpitations, most of them associated with sinus rhythm or borderline sinus tachycardia. There was one episode where her rate reached 168, the duration of this is unknown. Upon review of the strip, it appears to be sinus in origin. There were other episodes where she had PACs and PVCs but no pauses long enough to cause significant symptoms were noted.  She feels the palpitations and they bother her. They started again the other day as she was driving home. She is limiting caffeine, compliant with medications and not doing any over-the-counter medications. She is making herself eat and feels that she is eating better. She wonders if the Eliquis is causing the palpitations and wonders if we can change it. She is very concerned because she is supposed to start a new job in a week and wants to be ready for that.   Past Medical History  Diagnosis Date  . SUI (stress urinary incontinence, female)   . Wears glasses   . DVT, lower extremity, distal, acute ([facility])     paired right peroneal veins; 2 weeks after back surgery    Past Surgical History  Procedure Laterality Date  . Exploratory laparotomy w/  left  salpingoophorectomy  01-25-2001  . Abdominal hysterectomy  1995    W/ RIGHT SALPINGOOPHORECTOMY  . Laparoscopy's and laparotomy's for persistant ovarian cyst  X 5  1987;  1989;  1990;  1993;  1994  . Pubovaginal sling N/A 10/31/2013    Procedure: Jamelle Rushing;  Surgeon: Kathi Ludwig, MD;  Location: [facility];  Service: Urology;  Laterality: N/A;    Current Outpatient Prescriptions  Medication Sig Dispense Refill  . Acetaminophen (TYLENOL PO) Take 2-3 tablets by mouth every 6 (six) hours as needed (PAIN OR HEADACHE).    Marland Kitchen apixaban (ELIQUIS) 5 MG TABS tablet Take 1 tablet (5 mg total) by mouth 2 (two) times daily. 180 tablet 3  . azelastine (ASTELIN) 0.1 % nasal spray Place 1 spray into both nostrils 2 (two) times daily.  0  . azithromycin (ZITHROMAX) 250 MG tablet Take 250 mg by mouth daily.  0  . benzonatate (TESSALON) 100 MG capsule Take 100 mg by mouth 2 (two) times daily.  0  . Calcium Carb-Cholecalciferol (CALCIUM 600 + D PO) Take 2 tablets by mouth daily.    . Cholecalciferol (VITAMIN D3) 5000 UNITS CAPS Take 1 capsule by mouth daily.    Marland Kitchen estradiol (ESTRACE) 0.5 MG tablet Take 0.5 mg by mouth daily.    . metoprolol tartrate (LOPRESSOR) 25 MG tablet Take 0.5 tablets (12.5 mg total) by mouth 3 (three) times daily. 90 tablet 3  . Multiple Vitamin (MULTIVITAMIN) tablet Take 1 tablet by mouth  daily.    . rivaroxaban (XARELTO) 20 MG TABS tablet Take 1 tablet (20 mg total) by mouth daily with supper. 30 tablet 0   No current facility-administered medications for this visit.    Allergies:   Amoxicillin and Latex    Social History:  The patient  reports that she quit smoking about 15 years ago. Her smoking use included Cigarettes. She has a 30 pack-year smoking history. She has never used smokeless tobacco. She reports that she drinks about 2.0 oz of alcohol per week.   Family History:  The patient's family history includes Breast cancer in her mother;  Colon cancer in her paternal grandfather; Diabetes in her maternal aunt; Healthy in her brother and father; Osteopenia in her sister; Osteoporosis in her mother; Ovarian cancer in her maternal grandmother and mother.    ROS:  Please see the history of present illness. All other systems are reviewed and negative.    PHYSICAL EXAM: VS:  BP 116/82 mmHg  Pulse 65  Ht 5\' 5"  (1.651 m)  Wt 113 lb 1.6 oz (51.302 kg)  BMI 18.82 kg/m2 , BMI Body mass index is 18.82 kg/(m^2). GEN: Well nourished, well developed, female in no acute distress HEENT: normal for age  Neck: no JVD, no carotid bruit, no masses Cardiac: RRR; no murmur, no rubs, or gallops Respiratory:  clear to auscultation bilaterally, normal work of breathing GI: soft, nontender, nondistended, + BS MS: no deformity or atrophy; no edema; distal pulses are 2+ in all 4 extremities  Skin: warm and dry, no rash Neuro:  Strength and sensation are intact Psych: euthymic mood, full affect   EKG:  EKG is not ordered today.  Recent Labs: 10/09/2015: BUN 11; Creat 0.75; Potassium 4.6; Sodium 133* 11/13/2015: Hemoglobin 12.3; Platelets 169; TSH 3.332    Lipid Panel    Component Value Date/Time   CHOL 163 07/01/2012 1536   TRIG 98 07/01/2012 1536   HDL 74 07/01/2012 1536   CHOLHDL 2.2 07/01/2012 1536   VLDL 20 07/01/2012 1536   LDLCALC 69 07/01/2012 1536     Wt Readings from Last 3 Encounters:  11/23/15 113 lb 1.6 oz (51.302 kg)  11/13/15 113 lb 14.4 oz (51.665 kg)  10/09/15 114 lb (51.71 kg)     Other studies Reviewed: Additional studies/ records that were reviewed today include: Previous office notes and other studies.  ASSESSMENT AND PLAN:  1.  Palpitations: Her blood pressure is not high enough to do much with her beta blocker, but we can try increasing it to 12.5 mg 3 times a day. She has no lifestyle issues and is encouraged to continue drinking plenty of water and eating well. I will discuss with Dr. Allyson SabalBerry whether an EP  consult might be helpful. Her TSH was within normal limits. Her symptoms are not consistent with POTS and not clearly consistent with inappropriate sinus tachycardia. She reported symptoms several times when her heart rate was 90-110, so the etiology of her symptoms is not clear.  2. Anticoagulation: I discussed with Belenda CruiseKristin whether there was any history of Eliquis causing tachycardia or shortness of breath. She was not aware of any reports of this type, but it is a simple matter to give her a trial of Xarelto and switch her to this if she tolerates it better.   Current medicines are reviewed at length with the patient today.  The patient does not have concerns regarding medicines.  The following changes have been made:  Increase metoprolol to 12.5  mg 3 times a day  Labs/ tests ordered today include:  No orders of the defined types were placed in this encounter.     Disposition:   FU with Dr. Allyson Sabal  Signed, Leanna Battles  11/23/2015 5:58 PM    [facility] [address] [place], [place], [place]  [address] Phone: [phone]; Fax: [phone]

## 2015-11-29 ENCOUNTER — Telehealth: Payer: Self-pay

## 2015-11-29 ENCOUNTER — Ambulatory Visit: Payer: BLUE CROSS/BLUE SHIELD | Admitting: Physician Assistant

## 2015-11-29 NOTE — Telephone Encounter (Signed)
Pt made aware of results no questions at this time. Monitor results Pt offered appt on 2/21  but she has to check and call me back.  Pt instructed she can speak with scheduler when she calls back and setup appt with Dr. Allyson Sabal, she does not have to speak with me directly

## 2015-12-17 ENCOUNTER — Ambulatory Visit: Payer: BLUE CROSS/BLUE SHIELD | Admitting: Physician Assistant

## 2015-12-21 ENCOUNTER — Other Ambulatory Visit: Payer: Self-pay | Admitting: *Deleted

## 2015-12-21 MED ORDER — RIVAROXABAN 20 MG PO TABS: 20.0000 mg | ORAL_TABLET | Freq: Every day | ORAL | Status: DC

## 2015-12-24 ENCOUNTER — Other Ambulatory Visit: Payer: Self-pay | Admitting: *Deleted

## 2015-12-24 MED ORDER — RIVAROXABAN 20 MG PO TABS: 20.0000 mg | ORAL_TABLET | Freq: Every day | ORAL | Status: DC

## 2015-12-25 ENCOUNTER — Other Ambulatory Visit: Payer: Self-pay | Admitting: Pharmacist Clinician (PhC)/ Clinical Pharmacy Specialist

## 2015-12-25 MED ORDER — RIVAROXABAN 20 MG PO TABS: 20.0000 mg | ORAL_TABLET | Freq: Every day | ORAL | Status: DC

## 2016-01-08 ENCOUNTER — Inpatient Hospital Stay (HOSPITAL_COMMUNITY): Admission: RE | Admit: 2016-01-08 | Payer: BLUE CROSS/BLUE SHIELD | Source: Ambulatory Visit

## 2016-01-10 ENCOUNTER — Encounter (HOSPITAL_COMMUNITY): Payer: Self-pay | Admitting: Cardiovascular Disease

## 2016-01-30 ENCOUNTER — Ambulatory Visit: Payer: BLUE CROSS/BLUE SHIELD | Admitting: Cardiovascular Disease

## 2016-12-31 ENCOUNTER — Other Ambulatory Visit: Payer: Self-pay | Admitting: Obstetrics and Gynecology

## 2016-12-31 DIAGNOSIS — R928 Other abnormal and inconclusive findings on diagnostic imaging of breast: Secondary | ICD-10-CM

## 2017-01-06 ENCOUNTER — Ambulatory Visit
Admission: RE | Admit: 2017-01-06 | Discharge: 2017-01-06 | Disposition: A | Discharge status: Home / Self Care | Payer: BLUE CROSS/BLUE SHIELD | Source: Ambulatory Visit | Attending: Obstetrics and Gynecology | Admitting: Obstetrics and Gynecology

## 2017-01-06 DIAGNOSIS — R928 Other abnormal and inconclusive findings on diagnostic imaging of breast: Secondary | ICD-10-CM

## 2017-10-23 ENCOUNTER — Encounter: Payer: Self-pay | Admitting: Hematology & Oncology

## 2017-11-26 ENCOUNTER — Inpatient Hospital Stay: Payer: BLUE CROSS/BLUE SHIELD

## 2017-11-26 ENCOUNTER — Inpatient Hospital Stay: Payer: BLUE CROSS/BLUE SHIELD | Attending: Hematology & Oncology | Admitting: Hematology & Oncology

## 2017-11-26 VITALS — BP 121/71 | HR 97 | Temp 98.1°F | Resp 17 | Wt 120.5 lb

## 2017-11-26 DIAGNOSIS — D696 Thrombocytopenia, unspecified: Secondary | ICD-10-CM | POA: Diagnosis not present

## 2017-11-26 DIAGNOSIS — Z90710 Acquired absence of both cervix and uterus: Secondary | ICD-10-CM | POA: Insufficient documentation

## 2017-11-26 DIAGNOSIS — Z86718 Personal history of other venous thrombosis and embolism: Secondary | ICD-10-CM | POA: Insufficient documentation

## 2017-11-26 DIAGNOSIS — Z8041 Family history of malignant neoplasm of ovary: Secondary | ICD-10-CM | POA: Insufficient documentation

## 2017-11-26 DIAGNOSIS — Z800 Family history of malignant neoplasm of digestive organs: Secondary | ICD-10-CM | POA: Insufficient documentation

## 2017-11-26 DIAGNOSIS — R748 Abnormal levels of other serum enzymes: Secondary | ICD-10-CM | POA: Diagnosis not present

## 2017-11-26 DIAGNOSIS — Z87891 Personal history of nicotine dependence: Secondary | ICD-10-CM | POA: Diagnosis not present

## 2017-11-26 DIAGNOSIS — R718 Other abnormality of red blood cells: Secondary | ICD-10-CM | POA: Insufficient documentation

## 2017-11-26 DIAGNOSIS — R634 Abnormal weight loss: Secondary | ICD-10-CM | POA: Insufficient documentation

## 2017-11-26 LAB — CMP (CANCER CENTER ONLY)
ALBUMIN: 3.6 g/dL (ref 3.5–5.0)
ALK PHOS: 97 U/L — AB (ref 26–84)
ALT: 58 U/L — ABNORMAL HIGH (ref 0–55)
AST: 107 U/L — AB (ref 5–34)
Anion gap: 10 (ref 5–15)
BILIRUBIN TOTAL: 0.4 mg/dL (ref 0.2–1.2)
BUN: 9 mg/dL (ref 7–22)
CALCIUM: 8.6 mg/dL (ref 8.0–10.3)
CO2: 28 mmol/L (ref 18–33)
CREATININE: 0.75 mg/dL (ref 0.60–1.10)
Chloride: 100 mmol/L (ref 98–108)
Glucose, Bld: 84 mg/dL (ref 73–118)
Potassium: 4.9 mmol/L (ref 3.5–5.1)
Sodium: 138 mmol/L (ref 128–145)
Total Protein: 6.7 g/dL (ref 6.4–8.1)

## 2017-11-26 LAB — TECHNOLOGIST SMEAR REVIEW: TECH REVIEW: NORMAL

## 2017-11-26 LAB — CBC WITH DIFFERENTIAL (CANCER CENTER ONLY)
BASOS PCT: 0 %
Basophils Absolute: 0 10*3/uL (ref 0.0–0.1)
Eosinophils Absolute: 0.2 10*3/uL (ref 0.0–0.5)
Eosinophils Relative: 3 %
HEMATOCRIT: 32.8 % — AB (ref 34.8–46.6)
HEMOGLOBIN: 11.4 g/dL — AB (ref 11.6–15.9)
LYMPHS PCT: 18 %
Lymphs Abs: 1 10*3/uL (ref 0.9–3.3)
MCH: 36.8 pg — ABNORMAL HIGH (ref 26.0–34.0)
MCHC: 34.8 g/dL (ref 32.0–36.0)
MCV: 105.8 fL — ABNORMAL HIGH (ref 81.0–101.0)
MONO ABS: 0.4 10*3/uL (ref 0.1–0.9)
MONOS PCT: 7 %
NEUTROS ABS: 4.2 10*3/uL (ref 1.5–6.5)
Neutrophils Relative %: 72 %
Platelet Count: 100 10*3/uL — ABNORMAL LOW (ref 145–400)
RBC: 3.1 MIL/uL — ABNORMAL LOW (ref 3.70–5.32)
RDW: 12.7 % (ref 11.1–15.7)
WBC Count: 5.8 10*3/uL (ref 3.9–10.3)

## 2017-11-26 LAB — SAVE SMEAR

## 2017-11-26 NOTE — Progress Notes (Signed)
Referral MD  Reason for Referral: Progressive thrombocytopenia; weight loss, fatigue  No chief complaint on file. : I just do not feel good.  My platelets are low.  HPI: Miranda Fleming is a very charming 52 year old white female.  She is followed by Dr. Dian Queen.  She has 2 grown children.  She has not felt well for the past several months.  She has lost weight.  She typically weighed about 120 pounds.  She said that her weight got down to 103 pounds.  She has gained a little bit back.  She just feels so tired as she cannot exercise.  She like to go to the gym to lift weights.  Dr. Helane Rima did lab work back in February 2018.  At that time, Miranda Fleming platelet count was normal at 198K.  She was not anemic.  Her white cell count was normal.  All of her electrolytes looked okay.  She had an incredibly high HDL of 103.  She had her thyroid checked which was normal.  She then had lab work done in November.  It was then that her platelet count was found to be down.  A CBC was done which showed a white cell count of 6.1.  Hemoglobin 11.7.  Hematocrit 33.1.  Platelet count 109,000.  Her MCV, surprisingly was on the high side at 100.  She had a normal white cell differential.  She had normal electrolytes.  She was then referred to the [facility] for an evaluation.  She has had this "dermatitis" on her scalp.  She was given some lotion for this.  Her skin is somewhat dry.  She has had a hysterectomy.  She apparently had a fusion of her SI joint.  This appeared to be on the right side.  After this, she developed a DVT.  This was about 2 years ago.  She had a Doppler done in November 2016.  It sounds like she had a thrombus in the paired peroneal veins in the right lower leg.  She initially was on Xarelto.  She cannot tolerate this.  She was then put on Eliquis.  She was on this for 6 months.  Again, she has had 2 pregnancies without difficulty.  There is no family  history of blood clots.  There is a family history of malignancy.  I think her mother had breast cancer when she was 16.  There was a maternal grandmother with a history of ovarian cancer.  She had her mammogram in February 2018.  She is looked okay.  She has had a hysterectomy with her ovaries removed.  She apparently had a lot of ovarian cysts.  She has had multiple other surgeries.  She just wants to feel better.  Recently, she began to experience some pain in bilateral inguinal regions.  She has not felt any palpable lymph nodes.  She is not a vegetarian.  Her appetite is been down a little bit.  She has had some nausea but no vomiting.  She used to smoke quite a bit.  She stopped 25 years ago.  Sounds like she may have a 10-pack-year history of tobacco use.  She has no obvious occupational exposures.  Overall, her performance status is ECOG 1-2.    Past Medical History:  Diagnosis Date  . DVT, lower extremity, distal, acute ([place])    paired right peroneal veins; 2 weeks after back surgery  . Palpitations 11/23/2015  . SUI (stress urinary incontinence, female)   .  Wears glasses   :  Past Surgical History:  Procedure Laterality Date  . ABDOMINAL HYSTERECTOMY  1995   W/ RIGHT SALPINGOOPHORECTOMY  . EXPLORATORY LAPAROTOMY W/  LEFT SALPINGOOPHORECTOMY  01-25-2001  . LAPAROSCOPY'S AND LAPAROTOMY'S FOR PERSISTANT OVARIAN CYST  X 5  1987;  1989;  1990;  1993;  1994  . PUBOVAGINAL SLING N/A 10/31/2013   Procedure: Marylu Lund;  Surgeon: Ailene Rud, MD;  Location: [facility];  Service: Urology;  Laterality: N/A;  :   Current Outpatient Medications:  .  ALPRAZolam (XANAX) 0.5 MG tablet, Take 0.5 mg by mouth at bedtime as needed for anxiety., Disp: , Rfl:  .  Ergocalciferol (VITAMIN D2 PO), Take 50,000 Units by mouth., Disp: , Rfl:  .  Acetaminophen (TYLENOL PO), Take 2-3 tablets by mouth every 6 (six) hours as needed (PAIN OR HEADACHE).,  Disp: , Rfl:  .  azelastine (ASTELIN) 0.1 % nasal spray, Place 1 spray into both nostrils 2 (two) times daily., Disp: , Rfl: 0 .  azithromycin (ZITHROMAX) 250 MG tablet, Take 250 mg by mouth daily., Disp: , Rfl: 0 .  benzonatate (TESSALON) 100 MG capsule, Take 100 mg by mouth 2 (two) times daily., Disp: , Rfl: 0 .  Calcium Carb-Cholecalciferol (CALCIUM 600 + D PO), Take 2 tablets by mouth daily., Disp: , Rfl:  .  Cholecalciferol (VITAMIN D3) 5000 UNITS CAPS, Take 1 capsule by mouth daily., Disp: , Rfl:  .  estradiol (ESTRACE) 0.5 MG tablet, Take 0.5 mg by mouth daily., Disp: , Rfl:  .  metoprolol tartrate (LOPRESSOR) 25 MG tablet, Take 0.5 tablets (12.5 mg total) by mouth 3 (three) times daily., Disp: 90 tablet, Rfl: 3 .  Multiple Vitamin (MULTIVITAMIN) tablet, Take 1 tablet by mouth daily., Disp: , Rfl:  .  rivaroxaban (XARELTO) 20 MG TABS tablet, Take 1 tablet (20 mg total) by mouth daily with supper., Disp: 30 tablet, Rfl: 3:  :  Allergies  Allergen Reactions  . Amoxicillin Rash  . Latex Rash  :  Family History  Problem Relation Age of Onset  . Ovarian cancer Mother   . Osteoporosis Mother   . Breast cancer Mother        Age 34  . Diabetes Maternal Aunt   . Ovarian cancer Maternal Grandmother   . Colon cancer Paternal Grandfather   . Healthy Father   . Osteopenia Sister   . Healthy Brother   :  Social History   Socioeconomic History  . Marital status: Married    Spouse name: Not on file  . Number of children: Not on file  . Years of education: Not on file  . Highest education level: Not on file  Social Needs  . Financial resource strain: Not on file  . Food insecurity - worry: Not on file  . Food insecurity - inability: Not on file  . Transportation needs - medical: Not on file  . Transportation needs - non-medical: Not on file  Occupational History  . Not on file  Tobacco Use  . Smoking status: Former Smoker    Packs/day: 1.50    Years: 20.00    Pack years:  30.00    Types: Cigarettes    Last attempt to quit: 10/26/2000    Years since quitting: 17.0  . Smokeless tobacco: Never Used  Substance and Sexual Activity  . Alcohol use: Yes    Alcohol/week: 2.0 oz    Types: 4 Standard drinks or equivalent per week  .  Drug use: Not on file  . Sexual activity: Not on file  Other Topics Concern  . Not on file  Social History Narrative  . Not on file  :  Pertinent items are noted in HPI.  Exam: Thin white female in no obvious distress.  Vital signs are temperature of 98.1.  Pulse 100.  Blood pressure 121/71.  Weight is 120 pounds.  Head neck exam shows no ocular or oral lesions.  She has no scleral icterus.  There is no adenopathy in the neck.  She has good extraocular muscle movement.  Her lungs are clear bilaterally.  Cardiac exam slightly tachycardic but regular.  She has no murmurs, rubs or bruits.  Abdomen is soft.  She has good bowel sounds.  There is no fluid wave.  There is no palpable liver or spleen tip.  I cannot palpate any inguinal adenopathy bilaterally.  Back exam shows no tenderness over the spine, ribs or hips.  Extremities shows no clubbing, cyanosis or edema.  She has negative Homans sign.  She has no palpable venous cord in her legs.  Skin exam shows skin to be dry.  I cannot find any ecchymoses or petechia.  Neurological exam shows no focal neurological deficits. _0 @   Recent Labs    11/26/17 1402  WBC 5.8  HCT 32.8*  PLT 100*   Recent Labs    11/26/17 1402  NA 138  K 4.9  CL 100  CO2 28  GLUCOSE 84  BUN 9  CALCIUM 8.6    Blood smear review: Normochromic and normocytic population of red blood cells.  She may have some slight macrocytic red blood cells.  I see no inclusion bodies.  She has no target cells.  There is no rouleaux formation.  I see no nucleated red blood cells.  White blood cells are normal in morphology maturation.  There are no hypersegmented polys.  I see no immature myeloid or lymphoid cells.   There is no atypical lymphocytes.  Platelets are mildly decreased in number.  She has some large platelets.  Platelets appear to be well granulated.  Pathology: None    Assessment and Plan: Miranda Fleming is a very charming 52 year old white female.  She has progressive thrombocytopenia.  It appears that the timeframe is about a year.  One year ago she had a normal platelet count.  What intrigued me is the fact that her MCV is so high.  I am not sure as to why this would be high.  I would not think that she is B12 deficient.  However, this is a possibility.  I did not see any agglutination of the red cells that could account for an elevated MCV.  Myelodysplasia is always a possibility when we see an elevated MCV.  However, given that she is only 52 years old I would think this would be unusual.  I do think that we have to get a CT scan on her.  Her alkaline phosphatase is a little bit elevated.  She has been complaining of some pain in the inguinal region.  Ultimately, she may need a bone marrow biopsy.  I think this might be necessary for [place] to rule out any type of myelodysplastic process.  I spent a good hour with her.  I reviewed all the lab work with her.  I went over my recommendations.  She understands this very well.  I answered all of her questions.  Again, she just wants to feel better.  I  feel bad that she just does not feel all that great.  It does look like there is something going on.  I think it is obvious that she has lost weight and that this weight loss is unintentional.  Hopefully, we will not find any obvious malignancy.  Once we get the results back from all of her studies, we will get her back in for follow-up.

## 2017-11-27 LAB — LACTATE DEHYDROGENASE: LDH: 177 U/L (ref 125–245)

## 2017-11-27 LAB — VITAMIN B12: VITAMIN B 12: 1243 pg/mL — AB (ref 180–914)

## 2017-12-01 LAB — ANTINUCLEAR ANTIBODIES, IFA: ANA Ab, IFA: NEGATIVE

## 2017-12-08 ENCOUNTER — Ambulatory Visit
Admission: RE | Admit: 2017-12-08 | Discharge: 2017-12-08 | Disposition: A | Discharge status: Home / Self Care | Payer: BLUE CROSS/BLUE SHIELD | Source: Ambulatory Visit | Attending: Hematology & Oncology | Admitting: Hematology & Oncology

## 2017-12-08 DIAGNOSIS — K760 Fatty (change of) liver, not elsewhere classified: Secondary | ICD-10-CM | POA: Insufficient documentation

## 2017-12-08 DIAGNOSIS — D696 Thrombocytopenia, unspecified: Secondary | ICD-10-CM | POA: Diagnosis not present

## 2017-12-08 DIAGNOSIS — R5383 Other fatigue: Secondary | ICD-10-CM | POA: Insufficient documentation

## 2017-12-08 DIAGNOSIS — R634 Abnormal weight loss: Secondary | ICD-10-CM | POA: Diagnosis present

## 2017-12-08 MED ORDER — IOPAMIDOL (ISOVUE-300) INJECTION 61%
85.0000 mL | Freq: Once | INTRAVENOUS | Status: AC | PRN
  Administered 2017-12-08: 85 mL via INTRAVENOUS

## 2017-12-09 ENCOUNTER — Telehealth: Payer: Self-pay | Admitting: *Deleted

## 2017-12-09 NOTE — Telephone Encounter (Addendum)
Patient is aware of results  ----- Message from Josph MachoPeter R Ennever, MD sent at 12/08/2017  5:29 PM EST ----- Call - the CT scan dose not show any cancer.  NO enlarged spleen. You do have a fatty liver.  Please fax this result to her family MD.  Cindee LamePete

## 2017-12-18 ENCOUNTER — Other Ambulatory Visit: Payer: Self-pay

## 2017-12-18 ENCOUNTER — Encounter: Payer: Self-pay | Admitting: Hematology & Oncology

## 2017-12-18 ENCOUNTER — Inpatient Hospital Stay: Payer: BLUE CROSS/BLUE SHIELD

## 2017-12-18 ENCOUNTER — Other Ambulatory Visit: Payer: Self-pay | Admitting: *Deleted

## 2017-12-18 ENCOUNTER — Inpatient Hospital Stay: Payer: BLUE CROSS/BLUE SHIELD | Attending: Hematology & Oncology | Admitting: Hematology & Oncology

## 2017-12-18 VITALS — BP 118/58 | HR 79 | Temp 98.1°F | Resp 16 | Wt 119.0 lb

## 2017-12-18 DIAGNOSIS — I824Z2 Acute embolism and thrombosis of unspecified deep veins of left distal lower extremity: Secondary | ICD-10-CM

## 2017-12-18 DIAGNOSIS — M818 Other osteoporosis without current pathological fracture: Secondary | ICD-10-CM

## 2017-12-18 DIAGNOSIS — D501 Sideropenic dysphagia: Secondary | ICD-10-CM

## 2017-12-18 DIAGNOSIS — D696 Thrombocytopenia, unspecified: Secondary | ICD-10-CM | POA: Diagnosis not present

## 2017-12-18 DIAGNOSIS — R718 Other abnormality of red blood cells: Secondary | ICD-10-CM | POA: Insufficient documentation

## 2017-12-18 LAB — CMP (CANCER CENTER ONLY)
ALK PHOS: 71 U/L (ref 40–150)
ALT: 19 U/L (ref 0–55)
AST: 24 U/L (ref 5–34)
Albumin: 3.5 g/dL (ref 3.5–5.0)
Anion gap: 11 (ref 3–11)
BILIRUBIN TOTAL: 0.4 mg/dL (ref 0.2–1.2)
BUN: 4 mg/dL — ABNORMAL LOW (ref 7–26)
CALCIUM: 8.7 mg/dL (ref 8.4–10.4)
CO2: 23 mmol/L (ref 22–29)
Chloride: 97 mmol/L — ABNORMAL LOW (ref 98–109)
Creatinine: 0.6 mg/dL (ref 0.60–1.10)
GFR, Estimated: >60 mL/min (ref >60)
Glucose, Bld: 93 mg/dL (ref 70–140)
Potassium: 4.1 mmol/L (ref 3.5–5.1)
SODIUM: 131 mmol/L — AB (ref 136–145)
TOTAL PROTEIN: 6.6 g/dL (ref 6.4–8.3)

## 2017-12-18 LAB — CBC WITH DIFFERENTIAL/PLATELET
Basophils Absolute: 0 10*3/uL (ref 0.0–0.1)
Basophils Relative: 0 %
EOS ABS: 0.1 10*3/uL (ref 0.0–0.5)
Eosinophils Relative: 1 %
HCT: 33 % — ABNORMAL LOW (ref 34.8–46.6)
HEMOGLOBIN: 11.3 g/dL — AB (ref 11.6–15.9)
LYMPHS ABS: 1.3 10*3/uL (ref 0.9–3.3)
Lymphocytes Relative: 16 %
MCH: 36.1 pg — ABNORMAL HIGH (ref 26.0–34.0)
MCHC: 34.2 g/dL (ref 32.0–36.0)
MCV: 105.4 fL — ABNORMAL HIGH (ref 81.0–101.0)
Monocytes Absolute: 0.5 10*3/uL (ref 0.1–0.9)
Monocytes Relative: 6 %
NEUTROS PCT: 77 %
Neutro Abs: 6 10*3/uL (ref 1.5–6.5)
Platelets: 266 10*3/uL (ref 145–400)
RBC: 3.13 MIL/uL — ABNORMAL LOW (ref 3.70–5.32)
RDW: 11.5 % (ref 11.1–15.7)
WBC: 7.9 10*3/uL (ref 3.9–10.0)

## 2017-12-18 LAB — IRON AND TIBC
Iron: 45 ug/dL (ref 41–142)
Saturation Ratios: 22 % (ref 21–57)
TIBC: 205 ug/dL — ABNORMAL LOW (ref 236–444)
UIBC: 160 ug/dL

## 2017-12-18 LAB — SAVE SMEAR

## 2017-12-18 LAB — RETICULOCYTES
RBC.: 3.17 MIL/uL — ABNORMAL LOW (ref 3.70–5.45)
RETIC COUNT ABSOLUTE: 60.2 10*3/uL (ref 33.7–90.7)
Retic Ct Pct: 1.9 % (ref 0.7–2.1)

## 2017-12-18 LAB — LACTATE DEHYDROGENASE: LDH: 149 U/L (ref 125–245)

## 2017-12-18 LAB — FERRITIN: Ferritin: 240 ng/mL (ref 9–269)

## 2017-12-18 LAB — VITAMIN B12: Vitamin B-12: 1409 pg/mL — ABNORMAL HIGH (ref 180–914)

## 2017-12-18 NOTE — Progress Notes (Signed)
Hematology and Oncology Follow Up Visit  Miranda Fleming [id number] [birth year] 52 y.o. 12/18/2017   Principle Diagnosis:   Transient thrombocytopenia  Macrocytic anemia  Current Therapy:    Observation     Interim History:  Miranda Fleming is back for her second office visit.  We first saw her about a month ago.  At that time, she had some thrombocytopenia.  Only saw her, her platelet count was 100,000.  We did do a CT of her abdomen and pelvis.  I reviewed this with the patient today.  Everything looked okay without any evidence of splenomegaly.  There is no lymphadenopathy.  When we saw her, we checked her vitamin B12 level.  This was actually on the high side at 1200.  She had a normal LDH at 177.  We did a ANA test.  She was negative for ANA.  She did have some slightly abnormal liver function studies.  She still feels somewhat tired.  She has had no bleeding.  She is not had a colonoscopy as of yet.  There is no rashes.  She does have a little bit of eczema.  She has had no fever.  She has had no swollen lymph nodes.  Her appetite is okay.  Overall, I would say that her performance status is ECOG 1.  Medications:  Current Outpatient Medications:  .  Acetaminophen (TYLENOL PO), Take 2-3 tablets by mouth every 6 (six) hours as needed (PAIN OR HEADACHE)., Disp: , Rfl:  .  ALPRAZolam (XANAX) 0.5 MG tablet, Take 0.5 mg by mouth at bedtime as needed for anxiety., Disp: , Rfl:  .  Calcium Carb-Cholecalciferol (CALCIUM 600 + D PO), Take 2 tablets by mouth daily., Disp: , Rfl:  .  Cholecalciferol (VITAMIN D3) 5000 UNITS CAPS, Take 1 capsule by mouth daily., Disp: , Rfl:  .  Ergocalciferol (VITAMIN D2 PO), Take 50,000 Units by mouth., Disp: , Rfl:  .  estradiol (ESTRACE) 0.5 MG tablet, Take 0.5 mg by mouth daily., Disp: , Rfl:  .  Multiple Vitamin (MULTIVITAMIN) tablet, Take 1 tablet by mouth daily., Disp: , Rfl:   Allergies:  Allergies  Allergen Reactions  .  Amoxicillin Rash  . Latex Rash    Past Medical History, Surgical history, Social history, and Family History were reviewed and updated.  Review of Systems: Review of Systems  Constitutional: Positive for fatigue.  HENT:  Negative.   Eyes: Negative.   Respiratory: Negative.   Cardiovascular: Negative.   Gastrointestinal: Positive for nausea.  Endocrine: Negative.   Genitourinary: Positive for frequency.   Musculoskeletal: Positive for myalgias.  Skin: Positive for rash.  Neurological: Positive for light-headedness.  Hematological: Negative.   Psychiatric/Behavioral: Negative.     Physical Exam:  weight is 119 lb (54 kg). Her oral temperature is 98.1 F (36.7 C). Her blood pressure is 118/58 (abnormal) and her pulse is 79. Her respiration is 16 and oxygen saturation is 100%.   Wt Readings from Last 3 Encounters:  12/18/17 119 lb (54 kg)  11/26/17 120 lb 8 oz (54.7 kg)  11/23/15 113 lb 1.6 oz (51.3 kg)    Physical Exam  Constitutional: She is oriented to person, place, and time.  HENT:  Head: Normocephalic and atraumatic.  Mouth/Throat: Oropharynx is clear and moist.  Eyes: EOM are normal. Pupils are equal, round, and reactive to light.  Neck: Normal range of motion.  Cardiovascular: Normal rate, regular rhythm and normal heart sounds.  Pulmonary/Chest: Effort normal and breath sounds normal.  Abdominal: Soft. Bowel sounds are normal.  Musculoskeletal: Normal range of motion. She exhibits no edema, tenderness or deformity.  Lymphadenopathy:    She has no cervical adenopathy.  Neurological: She is alert and oriented to person, place, and time.  Skin: Skin is warm and dry. No rash noted. No erythema.  Psychiatric: She has a normal mood and affect. Her behavior is normal. Judgment and thought content normal.  Vitals reviewed.    Lab Results  Component Value Date   WBC 7.9 12/18/2017   HGB 11.3 (L) 12/18/2017   HCT 33.0 (L) 12/18/2017   MCV 105.4 (H) 12/18/2017    PLT 266 12/18/2017     Chemistry      Component Value Date/Time   NA 138 11/26/2017 1402   K 4.9 11/26/2017 1402   CL 100 11/26/2017 1402   CO2 28 11/26/2017 1402   BUN 9 11/26/2017 1402   CREATININE 0.75 11/26/2017 1402   CREATININE 0.75 10/09/2015 1445      Component Value Date/Time   CALCIUM 8.6 11/26/2017 1402   ALKPHOS 97 (H) 11/26/2017 1402   AST 107 (H) 11/26/2017 1402   ALT 58 (H) 11/26/2017 1402   BILITOT 0.4 11/26/2017 1402       Impression and Plan: Miranda Fleming is a 52 year old white female.  She had transient thrombocytopenia.  This has resolved.  I am interested in her macrocytic indices.  Her MCV is still on the high side.  I am not sure as to why it is on the high side.  She is a little bit more anemic.  I suppose that she might have myelodysplasia.  She would be quite young to have this.  I still do not want to commit her to a bone marrow biopsy.  I think we have to follow along a little bit more closely before we think about having to do a bone marrow test on her.  I would like to see what her iron stores are.  Despite the macrocytic index, she might have some iron deficiency.  This could be making her feel tired.  If so, I would give her some IV iron.  I am also taking her vitamin D level.  I spent about 35 minutes with her.  Over half the time was spent face-to-face.  I reviewed the CT scans with her.  I went over all of her lab work.  I answered her questions.  I went over my recommendations.  She is thankful for the time that we spend with her.  I just want to see her feel better.  We will get her back in another 6 weeks.   [name], MD 2/8/20191:13 PM

## 2017-12-19 LAB — VITAMIN D 25 HYDROXY (VIT D DEFICIENCY, FRACTURES): Vit D, 25-Hydroxy: 50.8 ng/mL (ref 30.0–100.0)

## 2017-12-22 ENCOUNTER — Other Ambulatory Visit: Payer: Self-pay | Admitting: *Deleted

## 2017-12-22 MED ORDER — ERGOCALCIFEROL 1.25 MG (50000 UT) PO CAPS: 50000.0000 [IU] | ORAL_CAPSULE | ORAL | 3 refills | Status: DC

## 2017-12-26 ENCOUNTER — Emergency Department: Payer: BLUE CROSS/BLUE SHIELD

## 2017-12-26 ENCOUNTER — Other Ambulatory Visit: Payer: Self-pay

## 2017-12-26 ENCOUNTER — Encounter: Payer: Self-pay | Admitting: Emergency Medicine

## 2017-12-26 ENCOUNTER — Emergency Department
Admission: EM | Admit: 2017-12-26 | Discharge: 2017-12-26 | Disposition: A | Discharge status: Home / Self Care | Payer: BLUE CROSS/BLUE SHIELD | Attending: Emergency Medicine | Admitting: Emergency Medicine

## 2017-12-26 DIAGNOSIS — T148XXA Other injury of unspecified body region, initial encounter: Secondary | ICD-10-CM

## 2017-12-26 DIAGNOSIS — S060X0A Concussion without loss of consciousness, initial encounter: Secondary | ICD-10-CM | POA: Insufficient documentation

## 2017-12-26 DIAGNOSIS — S0003XA Contusion of scalp, initial encounter: Secondary | ICD-10-CM | POA: Diagnosis not present

## 2017-12-26 DIAGNOSIS — Z87891 Personal history of nicotine dependence: Secondary | ICD-10-CM | POA: Diagnosis not present

## 2017-12-26 DIAGNOSIS — Y92009 Unspecified place in unspecified non-institutional (private) residence as the place of occurrence of the external cause: Secondary | ICD-10-CM | POA: Insufficient documentation

## 2017-12-26 DIAGNOSIS — S0990XA Unspecified injury of head, initial encounter: Secondary | ICD-10-CM | POA: Diagnosis present

## 2017-12-26 DIAGNOSIS — Y999 Unspecified external cause status: Secondary | ICD-10-CM | POA: Diagnosis not present

## 2017-12-26 DIAGNOSIS — Y939 Activity, unspecified: Secondary | ICD-10-CM | POA: Diagnosis not present

## 2017-12-26 DIAGNOSIS — W010XXA Fall on same level from slipping, tripping and stumbling without subsequent striking against object, initial encounter: Secondary | ICD-10-CM | POA: Insufficient documentation

## 2017-12-26 MED ORDER — ACETAMINOPHEN-CODEINE #3 300-30 MG PO TABS: 1.0000 | ORAL_TABLET | Freq: Three times a day (TID) | ORAL | 0 refills | Status: DC | PRN

## 2017-12-26 MED ORDER — METOCLOPRAMIDE HCL 10 MG PO TABS
10.0000 mg | ORAL_TABLET | Freq: Once | ORAL | Status: AC
Start: 2017-12-26 — End: 2017-12-26
  Administered 2017-12-26: 10 mg via ORAL
  Filled 2017-12-26: qty 1

## 2017-12-26 MED ORDER — METOCLOPRAMIDE HCL 5 MG PO TABS: 5.0000 mg | ORAL_TABLET | Freq: Three times a day (TID) | ORAL | 0 refills | Status: DC | PRN

## 2017-12-26 MED ORDER — ACETAMINOPHEN-CODEINE #3 300-30 MG PO TABS
1.0000 | ORAL_TABLET | Freq: Once | ORAL | Status: AC
  Administered 2017-12-26: 1 via ORAL
  Filled 2017-12-26: qty 1

## 2017-12-26 NOTE — ED Notes (Signed)
c-collar applied in triage

## 2017-12-26 NOTE — Discharge Instructions (Signed)
Your exam and CT scans are reassuring and negative (normal) at this time. There is no evidence of a serious brain injury. Take the medicines as needed. Apply ice to reduce scalp swelling. Avoid eye strain like reading, computer work, or texting. Follow-up with your provider or return as needed.

## 2017-12-26 NOTE — ED Provider Notes (Signed)
[facility] Emergency Department Provider Note ____________________________________________  Time seen: 2257  I have reviewed the triage vital signs and the nursing notes.  HISTORY  Chief Complaint  Fall and Head Injury  HPI Miranda Fleming is a 52 y.o. female presents to the ED accompanied by her husband, for evaluation of injuries related to a mechanical fall.  Patient describes she fell in her new home about 2 weeks prior.  She describes walking on a socked-feet on a very slick, wood floors.  She apparently fell backwards hitting the back of her head.  She did not seek any medical care following that injury.  She describes injury is relatively mild.  She did develop a small area of swelling to the back of the scalp.  She fell again today in the same manner, and developed a larger area of swelling to the back of the scalp.  She also reports some nausea secondary to the fall she denies any loss of consciousness, vomiting, weakness, or vertigo.  She presents now for further evaluation and management of posterior head injury following a fall.  Past Medical History:  Diagnosis Date  . DVT, lower extremity, distal, acute (HCC)    paired right peroneal veins; 2 weeks after back surgery  . Palpitations 11/23/2015  . SUI (stress urinary incontinence, female)   . Wears glasses     Patient Active Problem List   Diagnosis Date Noted  . Palpitations 11/23/2015  . DVT of lower limb, acute (HCC) 10/09/2015  . Stress incontinence in female 10/31/2013  . Ovarian cyst   . Anxiety     Past Surgical History:  Procedure Laterality Date  . ABDOMINAL HYSTERECTOMY  1995   W/ RIGHT SALPINGOOPHORECTOMY  . EXPLORATORY LAPAROTOMY W/  LEFT SALPINGOOPHORECTOMY  01-25-2001  . LAPAROSCOPY'S AND LAPAROTOMY'S FOR PERSISTANT OVARIAN CYST  X 5  1987;  1989;  1990;  1993;  1994  . PUBOVAGINAL SLING N/A 10/31/2013   Procedure: Jamelle Rushing;  Surgeon: Kathi Ludwig,  MD;  Location: [facility];  Service: Urology;  Laterality: N/A;    Prior to Admission medications   Medication Sig Start Date End Date Taking? Authorizing Provider  Acetaminophen (TYLENOL PO) Take 2-3 tablets by mouth every 6 (six) hours as needed (PAIN OR HEADACHE).    [provider]  acetaminophen-codeine (TYLENOL #3) 300-30 MG tablet Take 1 tablet by mouth every 8 (eight) hours as needed for moderate pain. 12/26/17   Alaiya Martindelcampo, Charlesetta Ivory, PA-C  ALPRAZolam (XANAX) 0.5 MG tablet Take 0.5 mg by mouth at bedtime as needed for anxiety.    [provider]  Calcium Carb-Cholecalciferol (CALCIUM 600 + D PO) Take 2 tablets by mouth daily.    [provider]  Cholecalciferol (VITAMIN D3) 5000 UNITS CAPS Take 1 capsule by mouth daily.    [provider]  ergocalciferol (VITAMIN D2) 50000 units capsule Take 1 capsule (50,000 Units total) by mouth once a week. 12/22/17   Josph Macho, MD  estradiol (ESTRACE) 0.5 MG tablet Take 0.5 mg by mouth daily.    [provider]  metoCLOPramide (REGLAN) 5 MG tablet Take 1 tablet (5 mg total) by mouth every 8 (eight) hours as needed for nausea or vomiting. 12/26/17   Corayma Cashatt, Charlesetta Ivory, PA-C  Multiple Vitamin (MULTIVITAMIN) tablet Take 1 tablet by mouth daily.    [provider]    Allergies Amoxicillin and Latex  Family History  Problem Relation Age of Onset  .  Ovarian cancer Mother   . Osteoporosis Mother   . Breast cancer Mother        Age 41  . Diabetes Maternal Aunt   . Ovarian cancer Maternal Grandmother   . Colon cancer Paternal Grandfather   . Healthy Father   . Osteopenia Sister   . Healthy Brother     Social History Social History   Tobacco Use  . Smoking status: Former Smoker    Packs/day: 1.50    Years: 20.00    Pack years: 30.00    Types: Cigarettes    Last attempt to quit: 10/26/2000    Years since quitting: 17.1  . Smokeless tobacco: Never Used   Substance Use Topics  . Alcohol use: Yes    Alcohol/week: 2.0 oz    Types: 4 Standard drinks or equivalent per week  . Drug use: Not on file    Review of Systems  Constitutional: Negative for fever. Eyes: Negative for visual changes. ENT: Negative for sore throat. Cardiovascular: Negative for chest pain. Respiratory: Negative for shortness of breath. Gastrointestinal: Negative for abdominal pain, vomiting and diarrhea. Genitourinary: Negative for dysuria. Musculoskeletal: Negative for back pain. Skin: Negative for rash.  Reports soft tissue swelling to the back of the scalp. Neurological: Negative for headaches, focal weakness or numbness.  Reports some mild dizziness and fatigue ____________________________________________  PHYSICAL EXAM:  VITAL SIGNS: ED Triage Vitals [12/26/17 2052]  Enc Vitals Group     BP 120/72     Pulse Rate 94     Resp 16     Temp (!) 97.5 F (36.4 C)     Temp Source Oral     SpO2 99 %     Weight 109 lb (49.4 kg)     Height 5\' 6"  (1.676 m)     Head Circumference      Peak Flow      Pain Score 10     Pain Loc      Pain Edu?      Excl. in GC?     Constitutional: Alert and oriented. Well appearing and in no distress. Head: Normocephalic and atraumatic. Large subcutaneous hematoma to the left posterior scalp. No local abrasion or laceration. No battles' sign.  Eyes: Conjunctivae are normal. PERRL. Normal extraocular movements and fundi bilaterally. Mouth/Throat: Mucous membranes are moist. Uvula and tongue are midline.  Neck: Supple. No thyromegaly. Normal ROM without crepitus.  Cardiovascular: Normal rate, regular rhythm. Normal distal pulses. Respiratory: Normal respiratory effort. No wheezes/rales/rhonchi. Gastrointestinal: Soft and nontender. No distention. Musculoskeletal: Nontender with normal range of motion in all extremities.  Neurologic: Nerves II through XII grossly intact.  Normal U/LE DTRs bilaterally.  Ataxia.  Normal gait  without ataxia. Normal speech and language. No gross focal neurologic deficits are appreciated. Skin:  Skin is warm, dry and intact. No rash noted. Psychiatric: Mood and affect are normal. Patient exhibits appropriate insight and judgment. ____________________________________________   RADIOLOGY  CT Head & C-Spine w/o CM IMPRESSION: 1. Left parietal scalp hematoma. No acute intracranial abnormality. No skull fracture. 2. CSF density structure in the left posterior fossa consistent with arachnoid cyst, incidental. 3. No fracture or subluxation of the cervical spine.  I, Sherrod Toothman, Charlesetta Ivory, personally viewed and evaluated these images (plain radiographs) as part of my medical decision making, as well as reviewing the written report by the radiologist. ____________________________________________  PROCEDURES  Procedures Reglan 10 mg PO Tylenol #3 I PO ____________________________________________  INITIAL IMPRESSION / ASSESSMENT AND PLAN /  ED COURSE  ED evaluation of injury sustained following 2 recent mechanical falls.  Most recent being this evening prior to arrival.  Patient sustained a posterior contusion and hematoma to the scalp.  There was no reported loss of consciousness.  The patient and her husband are reassured by her negative CT scans which show no intracranial process.  We reviewed the images to show the large subcutaneous hematoma on the posterior scalp.  Patient does have symptoms of a mild concussion following a fall.  She is advised of the self-limited course of these symptoms.  She is advised however to return to the ED or see her provider should the symptoms persist more than expected.  She is discharged with prescriptions for Tylenol 3 and Reglan to dose as needed.  She is also advised on eye rest and hydration. ____________________________________________  FINAL CLINICAL IMPRESSION(S) / ED DIAGNOSES  Final diagnoses:  Minor head injury, initial encounter   Concussion without loss of consciousness, initial encounter  Hematoma      Lissa HoardMenshew, Nataline Basara V Bacon, PA-C 12/27/17 0013    Merrily Brittleifenbark, Neil, MD 12/27/17 1452

## 2017-12-26 NOTE — ED Triage Notes (Signed)
Pt ambulatory to triage in NAD, report trip and fall w/ hematoma to back of head, reports fall 2 weeks ago as well.  Pt reports nausea.

## 2017-12-26 NOTE — ED Notes (Signed)
Pt is a&o x 4. With no noted neurological deficits. Pt does have a hematoma to the back left side of scalp around 2" in diameter. No lacerations noted. Tender to palpation

## 2018-01-29 ENCOUNTER — Ambulatory Visit: Payer: BLUE CROSS/BLUE SHIELD | Admitting: Family

## 2018-01-29 ENCOUNTER — Other Ambulatory Visit: Payer: BLUE CROSS/BLUE SHIELD

## 2018-10-21 ENCOUNTER — Other Ambulatory Visit: Payer: Self-pay | Admitting: Hematology & Oncology

## 2019-07-21 ENCOUNTER — Other Ambulatory Visit: Payer: Self-pay | Admitting: Hematology & Oncology

## 2019-11-08 DIAGNOSIS — G43909 Migraine, unspecified, not intractable, without status migrainosus: Secondary | ICD-10-CM | POA: Insufficient documentation

## 2021-05-24 ENCOUNTER — Emergency Department
Admission: EM | Admit: 2021-05-24 | Discharge: 2021-05-24 | Disposition: A | Discharge status: Home / Self Care | Payer: BC Managed Care – PPO | Attending: Emergency Medicine | Admitting: Emergency Medicine

## 2021-05-24 ENCOUNTER — Encounter: Payer: Self-pay | Admitting: Emergency Medicine

## 2021-05-24 ENCOUNTER — Other Ambulatory Visit: Payer: Self-pay

## 2021-05-24 ENCOUNTER — Emergency Department: Payer: BC Managed Care – PPO

## 2021-05-24 DIAGNOSIS — R197 Diarrhea, unspecified: Secondary | ICD-10-CM | POA: Insufficient documentation

## 2021-05-24 DIAGNOSIS — R1110 Vomiting, unspecified: Secondary | ICD-10-CM | POA: Diagnosis not present

## 2021-05-24 DIAGNOSIS — R1030 Lower abdominal pain, unspecified: Secondary | ICD-10-CM | POA: Diagnosis not present

## 2021-05-24 DIAGNOSIS — Y900 Blood alcohol level of less than 20 mg/100 ml: Secondary | ICD-10-CM | POA: Insufficient documentation

## 2021-05-24 DIAGNOSIS — Z91040 Latex allergy status: Secondary | ICD-10-CM | POA: Insufficient documentation

## 2021-05-24 DIAGNOSIS — Z20822 Contact with and (suspected) exposure to covid-19: Secondary | ICD-10-CM | POA: Diagnosis not present

## 2021-05-24 DIAGNOSIS — R4182 Altered mental status, unspecified: Secondary | ICD-10-CM | POA: Insufficient documentation

## 2021-05-24 DIAGNOSIS — Z87891 Personal history of nicotine dependence: Secondary | ICD-10-CM | POA: Insufficient documentation

## 2021-05-24 DIAGNOSIS — R569 Unspecified convulsions: Secondary | ICD-10-CM | POA: Insufficient documentation

## 2021-05-24 DIAGNOSIS — R55 Syncope and collapse: Secondary | ICD-10-CM | POA: Diagnosis not present

## 2021-05-24 LAB — URINALYSIS, COMPLETE (UACMP) WITH MICROSCOPIC
Bilirubin Urine: NEGATIVE
Glucose, UA: NEGATIVE mg/dL
Hgb urine dipstick: NEGATIVE
Ketones, ur: 20 mg/dL — AB
Leukocytes,Ua: NEGATIVE
Nitrite: NEGATIVE
Protein, ur: 100 mg/dL — AB
Specific Gravity, Urine: 1.024 (ref 1.005–1.030)
pH: 7 (ref 5.0–8.0)

## 2021-05-24 LAB — URINE DRUG SCREEN, QUALITATIVE (ARMC ONLY)
Amphetamines, Ur Screen: NOT DETECTED
Barbiturates, Ur Screen: NOT DETECTED
Benzodiazepine, Ur Scrn: POSITIVE — AB
Cannabinoid 50 Ng, Ur ~~LOC~~: POSITIVE — AB
Cocaine Metabolite,Ur ~~LOC~~: NOT DETECTED
MDMA (Ecstasy)Ur Screen: NOT DETECTED
Methadone Scn, Ur: NOT DETECTED
Opiate, Ur Screen: NOT DETECTED
Phencyclidine (PCP) Ur S: NOT DETECTED
Tricyclic, Ur Screen: NOT DETECTED

## 2021-05-24 LAB — CBC WITH DIFFERENTIAL/PLATELET
Abs Immature Granulocytes: 0.02 10*3/uL (ref 0.00–0.07)
Basophils Absolute: 0 10*3/uL (ref 0.0–0.1)
Basophils Relative: 0 %
Eosinophils Absolute: 0 10*3/uL (ref 0.0–0.5)
Eosinophils Relative: 0 %
HCT: 39.7 % (ref 36.0–46.0)
Hemoglobin: 14.4 g/dL (ref 12.0–15.0)
Immature Granulocytes: 0 %
Lymphocytes Relative: 15 %
Lymphs Abs: 0.9 10*3/uL (ref 0.7–4.0)
MCH: 36.2 pg — ABNORMAL HIGH (ref 26.0–34.0)
MCHC: 36.3 g/dL — ABNORMAL HIGH (ref 30.0–36.0)
MCV: 99.7 fL (ref 80.0–100.0)
Monocytes Absolute: 0.3 10*3/uL (ref 0.1–1.0)
Monocytes Relative: 5 %
Neutro Abs: 4.9 10*3/uL (ref 1.7–7.7)
Neutrophils Relative %: 80 %
Platelets: 195 10*3/uL (ref 150–400)
RBC: 3.98 MIL/uL (ref 3.87–5.11)
RDW: 11.5 % (ref 11.5–15.5)
WBC: 6.2 10*3/uL (ref 4.0–10.5)
nRBC: 0 % (ref 0.0–0.2)

## 2021-05-24 LAB — COMPREHENSIVE METABOLIC PANEL
ALT: 14 U/L (ref 0–44)
AST: 24 U/L (ref 15–41)
Albumin: 4.5 g/dL (ref 3.5–5.0)
Alkaline Phosphatase: 55 U/L (ref 38–126)
Anion gap: 13 (ref 5–15)
BUN: 7 mg/dL (ref 6–20)
CO2: 26 mmol/L (ref 22–32)
Calcium: 10.4 mg/dL — ABNORMAL HIGH (ref 8.9–10.3)
Chloride: 97 mmol/L — ABNORMAL LOW (ref 98–111)
Creatinine, Ser: 0.66 mg/dL (ref 0.44–1.00)
GFR, Estimated: >60 mL/min (ref >60)
Glucose, Bld: 163 mg/dL — ABNORMAL HIGH (ref 70–99)
Potassium: 3.3 mmol/L — ABNORMAL LOW (ref 3.5–5.1)
Sodium: 136 mmol/L (ref 135–145)
Total Bilirubin: 1.2 mg/dL (ref 0.3–1.2)
Total Protein: 7.5 g/dL (ref 6.5–8.1)

## 2021-05-24 LAB — RESP PANEL BY RT-PCR (FLU A&B, COVID) ARPGX2
Influenza A by PCR: NEGATIVE
Influenza B by PCR: NEGATIVE
SARS Coronavirus 2 by RT PCR: NEGATIVE

## 2021-05-24 LAB — LACTIC ACID, PLASMA
Lactic Acid, Venous: 3.1 mmol/L (ref 0.5–1.9)
Lactic Acid, Venous: 1 mmol/L (ref 0.5–1.9)

## 2021-05-24 LAB — TROPONIN I (HIGH SENSITIVITY)
Troponin I (High Sensitivity): 2 ng/L (ref <18)
Troponin I (High Sensitivity): 3 ng/L (ref <18)

## 2021-05-24 LAB — ETHANOL: Alcohol, Ethyl (B): <10 mg/dL (ref <10)

## 2021-05-24 LAB — LIPASE, BLOOD: Lipase: 35 U/L (ref 11–51)

## 2021-05-24 MED ORDER — DICYCLOMINE HCL 10 MG PO CAPS: 10.0000 mg | ORAL_CAPSULE | Freq: Three times a day (TID) | ORAL | 0 refills | Status: DC | PRN

## 2021-05-24 MED ORDER — ONDANSETRON 8 MG PO TBDP: 8.0000 mg | ORAL_TABLET | Freq: Three times a day (TID) | ORAL | 0 refills | Status: DC | PRN

## 2021-05-24 MED ORDER — SODIUM CHLORIDE 0.9 % IV BOLUS
1000.0000 mL | Freq: Once | INTRAVENOUS | Status: AC
  Administered 2021-05-24: 1000 mL via INTRAVENOUS

## 2021-05-24 MED ORDER — METOCLOPRAMIDE HCL 5 MG/ML IJ SOLN
10.0000 mg | Freq: Once | INTRAMUSCULAR | Status: AC
  Administered 2021-05-24: 10 mg via INTRAVENOUS
  Filled 2021-05-24: qty 2

## 2021-05-24 NOTE — ED Notes (Signed)
Pt. States she drinks approx. 3-4 glasses of wine per day. Last drink was 7/14.

## 2021-05-24 NOTE — Discharge Instructions (Addendum)
You likely had an episode of syncope.  It is possible that this also could have been a seizure.  You can take the Zofran as needed for nausea and the Bentyl as needed for abdominal cramping and diarrhea.  Make sure to drink small sips of water frequently throughout the day to stay hydrated.  Follow-up with your regular doctor.  Return to the ER for new, worsening, or persistent severe vomiting, diarrhea, weakness, passing out or feel like you are going to pass out, recurrent seizure-like activity, severe headache, or any other new or worsening symptoms that concern you.

## 2021-05-24 NOTE — ED Triage Notes (Addendum)
Patient to ER for c/o weakness with AMS since approx 1730. Husband said she turned and fell (has abrasion to right cheek). Husband noted possible seizure activity, patient was then altered and did not know who he was. Stated patient seemed stiff when he was trying to move her from floor. Patient has equal pupils, equal grips, no facial droop. Vitals per EMS: 111/58, 95 HR, 100%, 97.3 temp, 136 CBG. Patient able to answer questions appropriately at this time. Dr. Marisa Severin at bedside.

## 2021-05-24 NOTE — ED Notes (Signed)
ED Provider at bedside. 

## 2021-05-24 NOTE — ED Provider Notes (Signed)
[facility] Emergency Department Provider Note ____________________________________________   Event Date/Time   First MD Initiated Contact with Patient 05/24/21 1755     (approximate)  I have reviewed the triage vital signs and the nursing notes.   HISTORY  Chief Complaint Altered Mental Status and Weakness    HPI Miranda Fleming is a 55 y.o. female with PMH as noted below who presents with altered mental status, acute onset within the last hour and now resolving.  Per EMS, the husband saw the patient turn and then heard a thump and noted her to have apparently fallen.  When he came to her, the patient appeared awake with eyes open but was not responding.  She then became confused.  There may have been some brief seizure-like activity.  She has no prior history of seizures.  When EMS arrived she was confused and agitated.  The symptoms have subsequently resolved.  The patient does not remember any of this.  She states the last thing that she remembers is going to bed.  She reports that she has had vomiting, diarrhea, and abdominal pain for about the last 4 days, along with generalized weakness.  She cannot think of any specific inciting factor.  She has not eaten anything unusual or traveled anywhere.   Past Medical History:  Diagnosis Date   DVT, lower extremity, distal, acute (HCC)    paired right peroneal veins; 2 weeks after back surgery   Palpitations 11/23/2015   SUI (stress urinary incontinence, female)    Wears glasses     Patient Active Problem List   Diagnosis Date Noted   Palpitations 11/23/2015   DVT of lower limb, acute (HCC) 10/09/2015   Stress incontinence in female 10/31/2013   Ovarian cyst    Anxiety     Past Surgical History:  Procedure Laterality Date   ABDOMINAL HYSTERECTOMY  1995   W/ RIGHT SALPINGOOPHORECTOMY   EXPLORATORY LAPAROTOMY W/  LEFT SALPINGOOPHORECTOMY  01-25-2001   LAPAROSCOPY'S AND LAPAROTOMY'S FOR  PERSISTANT OVARIAN CYST  X 5  1987;  1989;  1990;  1993;  1994   PUBOVAGINAL SLING N/A 10/31/2013   Procedure: LYNX PUBO-VAGINAL SLING;  Surgeon: Kathi Ludwig, MD;  Location: [facility];  Service: Urology;  Laterality: N/A;    Prior to Admission medications   Medication Sig Start Date End Date Taking? Authorizing Provider  ALPRAZolam Prudy Feeler) 1 MG tablet Take 1 mg by mouth every 8 (eight) hours as needed. 04/21/21  Yes [provider]  calcium carbonate (OS-CAL - DOSED IN MG OF ELEMENTAL CALCIUM) 1250 (500 Ca) MG tablet Take 1 tablet by mouth daily with breakfast.   Yes [provider]  Clobetasol Propionate 0.05 % shampoo Apply 1 application topically daily.   Yes [provider]  dicyclomine (BENTYL) 10 MG capsule Take 1 capsule (10 mg total) by mouth 3 (three) times daily as needed for up to 14 days for spasms (abdominal cramping). 05/24/21 06/07/21 Yes Dionne Bucy, MD  estradiol (ESTRACE) 0.5 MG tablet Take 0.5 mg by mouth daily.   Yes [provider]  fluconazole (DIFLUCAN) 200 MG tablet Take 200 mg by mouth daily.   Yes [provider]  fluocinonide (LIDEX) 0.05 % external solution Apply 1 application topically daily.   Yes [provider]  gabapentin (NEURONTIN) 300 MG capsule Take 1 capsule by mouth at bedtime.   Yes [provider]  metoCLOPramide (REGLAN) 5 MG tablet Take 5 mg by mouth every  6 (six) hours as needed for nausea.   Yes [provider]  Multiple Vitamin (MULTIVITAMIN) tablet Take 1 tablet by mouth daily.   Yes [provider]  nitrofurantoin, macrocrystal-monohydrate, (MACROBID) 100 MG capsule Take 1 capsule by mouth in the morning and at bedtime.   Yes [provider]  Omega-3 Fatty Acids (FISH OIL) 1000 MG CAPS Take 1 capsule by mouth daily.   Yes [provider]  ondansetron (ZOFRAN ODT) 8 MG disintegrating tablet Take 1 tablet (8 mg total) by  mouth every 8 (eight) hours as needed for nausea or vomiting. 05/24/21  Yes Dionne Bucy, MD  traMADol (ULTRAM) 50 MG tablet Take 50 mg by mouth every 6 (six) hours as needed. 05/14/21  Yes [provider]  valACYclovir (VALTREX) 1000 MG tablet Take 1,000 mg by mouth 3 (three) times daily.   Yes [provider]  Vitamin D, Ergocalciferol, (DRISDOL) 1.25 MG (50000 UT) CAPS capsule TAKE ONE CAPSULE BY MOUTH ONE TIME PER WEEK 07/22/19  Yes Ennever, Rose Phi, MD    Allergies Amoxicillin and Latex  Family History  Problem Relation Age of Onset   Ovarian cancer Mother    Osteoporosis Mother    Breast cancer Mother        Age 53   Diabetes Maternal Aunt    Ovarian cancer Maternal Grandmother    Colon cancer Paternal Grandfather    Healthy Father    Osteopenia Sister    Healthy Brother     Social History Social History   Tobacco Use   Smoking status: Former    Packs/day: 1.50    Years: 20.00    Pack years: 30.00    Types: Cigarettes    Quit date: 10/26/2000    Years since quitting: 20.5   Smokeless tobacco: Never  Substance Use Topics   Alcohol use: Yes    Alcohol/week: 4.0 standard drinks    Types: 4 Standard drinks or equivalent per week    Review of Systems  Constitutional: No fever.  Positive for generalized weakness. Eyes: No redness. ENT: No sore throat. Cardiovascular: Denies chest pain. Respiratory: Denies shortness of breath. Gastrointestinal: Positive for vomiting and diarrhea.  Genitourinary: Negative for dysuria.  Musculoskeletal: Negative for back pain. Skin: Negative for rash. Neurological: Positive for headache.  ____________________________________________   PHYSICAL EXAM:  VITAL SIGNS: ED Triage Vitals [05/24/21 1756]  Enc Vitals Group     BP 132/60     Pulse Rate 82     Resp 20     Temp 97.6 F (36.4 C)     Temp Source Oral     SpO2 100 %     Weight      Height      Head Circumference      Peak Flow      Pain Score       Pain Loc      Pain Edu?      Excl. in GC?     Constitutional: Alert and oriented.  Somewhat weak appearing but in no acute distress. Eyes: Conjunctivae are normal.  EOMI.  PERRLA. Head: Atraumatic. Nose: No congestion/rhinnorhea. Mouth/Throat: Mucous membranes are dry.   Neck: Normal range of motion.  Cardiovascular: Normal rate, regular rhythm. Grossly normal heart sounds.  Good peripheral circulation. Respiratory: Normal respiratory effort.  No retractions. Lungs CTAB. Gastrointestinal: Soft with mild suprapubic discomfort.  No distention.  Genitourinary: No flank tenderness. Musculoskeletal: No lower extremity edema.  Extremities warm and well perfused.  Neurologic:  Normal speech and language.  Motor intact in all extremities.  No ataxia.  No facial droop. Skin:  Skin is warm and dry. No rash noted. Psychiatric: Calm and cooperative.  ____________________________________________   LABS (all labs ordered are listed, but only abnormal results are displayed)  Labs Reviewed  COMPREHENSIVE METABOLIC PANEL - Abnormal; Notable for the following components:      Result Value   Potassium 3.3 (*)    Chloride 97 (*)    Glucose, Bld 163 (*)    Calcium 10.4 (*)    All other components within normal limits  CBC WITH DIFFERENTIAL/PLATELET - Abnormal; Notable for the following components:   MCH 36.2 (*)    MCHC 36.3 (*)    All other components within normal limits  URINALYSIS, COMPLETE (UACMP) WITH MICROSCOPIC - Abnormal; Notable for the following components:   Color, Urine AMBER (*)    APPearance CLOUDY (*)    Ketones, ur 20 (*)    Protein, ur 100 (*)    Bacteria, UA RARE (*)    All other components within normal limits  LACTIC ACID, PLASMA - Abnormal; Notable for the following components:   Lactic Acid, Venous 3.1 (*)    All other components within normal limits  URINE DRUG SCREEN, QUALITATIVE ([facility] ONLY) - Abnormal; Notable for the following components:   Cannabinoid 50  Ng, Ur [place] POSITIVE (*)    Benzodiazepine, Ur Scrn POSITIVE (*)    All other components within normal limits  RESP PANEL BY RT-PCR (FLU A&B, COVID) ARPGX2  ETHANOL  LACTIC ACID, PLASMA  LIPASE, BLOOD  TROPONIN I (HIGH SENSITIVITY)  TROPONIN I (HIGH SENSITIVITY)   ____________________________________________  EKG  ED ECG REPORT I, Dionne BucySebastian Elgar Scoggins, the attending physician, personally viewed and interpreted this ECG.  Date: 05/24/2021 EKG Time: 1820 Rate: 69 Rhythm: normal sinus rhythm QRS Axis: normal Intervals: normal ST/T Wave abnormalities: Nonspecific T wave abnormalities Narrative Interpretation: Nonspecific abnormalities with no evidence of acute ischemia  ____________________________________________  RADIOLOGY  CT head: No ICH or other acute abnormality.  Arachnoid cyst unchanged from prior. XR L ankle: No acute fracture  ____________________________________________   PROCEDURES  Procedure(s) performed: No  Procedures  Critical Care performed: No ____________________________________________   INITIAL IMPRESSION / ASSESSMENT AND PLAN / ED COURSE  Pertinent labs & imaging results that were available during my care of the patient were reviewed by me and considered in my medical decision making (see chart for details).   55 year old female with PMH as noted above presents with acute onset of altered mental status.  Per EMS she had an unwitnessed fall and then appeared awake but confused.  There may have been brief seizure-like activity.  She then became agitated and is now returning to baseline.  She has no prior history of seizures.  She has also had nausea, vomiting, diarrhea over the last several days.  On exam the patient is somewhat weak appearing but in no acute distress.  She is oriented x4.  Her vital signs are normal.  Mucous membranes are dry.  Neurologic exam is normal.  The abdomen is soft with mild discomfort but no focal tenderness.  Overall  presentation is most consistent with syncope, possibly vasovagal or related to dehydration/hypovolemia, electrolyte abnormality, gastroenteritis or other acute infection, or other metabolic cause.  Differential includes seizure, alcohol intoxication or other substance use, or less likely cardiac cause.  We will obtain a CT head, lab work-up, give fluids, and reassess.  ----------------------------------------- 10:51 PM on 05/24/2021 -----------------------------------------  CT head shows a stable arachnoid cyst with no acute abnormality.  The patient had also reported some left ankle pain and an x-ray of this is negative.  Lab work-up is overall reassuring.  The initial lactate was elevated.  The patient had no clinical evidence of acute infection or sepsis.  This could be related to a brief seizure or dehydration/vomiting.  The remainder of the lab work-up is unremarkable except for some ketones and protein in the urine consistent with dehydration.  The patient did endorse to nurse drinking around 3 to 4 glasses of wine daily at times, however she has no tachycardia, hypotension, tremor, or other symptoms to suggest alcohol withdrawal.  The patient has now been in the ED for over 5 hours.  She is fully alert and oriented and states that she feels much better.  She had a headache but this has resolved, denies any abdominal pain or other acute symptoms.  She states that she would like to go home.  She appears well.  Her vital signs have remained stable.  Overall I suspect most likely syncope with a seizure-like episode rather than a true primary seizure.  Based on the ED observation over the last several hours and the reassuring work-up, the patient is stable for discharge home.  I do not suspect a cardiac etiology and there is no indication for further cardiac monitoring.  I counseled the patient and her family member on the results of the work-up.  I recommend that she follow-up with her primary  care doctor.  I have given her Zofran and Bentyl for symptomatic treatment of likely gastroenteritis.  Return precautions given, and the patient expressed understanding.  ____________________________________________   FINAL CLINICAL IMPRESSION(S) / ED DIAGNOSES  Final diagnoses:  Syncope, unspecified syncope type  Seizure-like activity (HCC)      NEW MEDICATIONS STARTED DURING THIS VISIT:  New Prescriptions   DICYCLOMINE (BENTYL) 10 MG CAPSULE    Take 1 capsule (10 mg total) by mouth 3 (three) times daily as needed for up to 14 days for spasms (abdominal cramping).   ONDANSETRON (ZOFRAN ODT) 8 MG DISINTEGRATING TABLET    Take 1 tablet (8 mg total) by mouth every 8 (eight) hours as needed for nausea or vomiting.     Note:  This document was prepared using Dragon voice recognition software and may include unintentional dictation errors.    Dionne Bucy, MD 05/24/21 [phone]

## 2021-05-24 NOTE — ED Notes (Signed)
Patient transported to CT. Unable to obtain EKG d/t shivering. Will revisit when patient returns.

## 2021-09-17 DIAGNOSIS — M25562 Pain in left knee: Secondary | ICD-10-CM | POA: Insufficient documentation

## 2021-09-17 DIAGNOSIS — M25561 Pain in right knee: Secondary | ICD-10-CM | POA: Insufficient documentation

## 2021-10-08 DIAGNOSIS — M25561 Pain in right knee: Secondary | ICD-10-CM | POA: Insufficient documentation

## 2022-01-20 ENCOUNTER — Telehealth: Payer: BC Managed Care – PPO | Admitting: Physician Assistant

## 2022-01-20 DIAGNOSIS — B9689 Other specified bacterial agents as the cause of diseases classified elsewhere: Secondary | ICD-10-CM

## 2022-01-20 DIAGNOSIS — J208 Acute bronchitis due to other specified organisms: Secondary | ICD-10-CM

## 2022-01-20 DIAGNOSIS — J019 Acute sinusitis, unspecified: Secondary | ICD-10-CM

## 2022-01-20 MED ORDER — AZITHROMYCIN 250 MG PO TABS: ORAL_TABLET | ORAL | 0 refills | Status: AC

## 2022-01-20 MED ORDER — BENZONATATE 100 MG PO CAPS: 100.0000 mg | ORAL_CAPSULE | Freq: Three times a day (TID) | ORAL | 0 refills | Status: DC | PRN

## 2022-01-20 MED ORDER — PROMETHAZINE-DM 6.25-15 MG/5ML PO SYRP: 5.0000 mL | ORAL_SOLUTION | Freq: Four times a day (QID) | ORAL | 0 refills | Status: DC | PRN

## 2022-01-20 NOTE — Patient Instructions (Signed)
Miranda DublinStephanie Lynn Fleming, thank you for joining Miranda LovelessJennifer M Miranda Welborn, PA-C for today's virtual visit.  While this provider is not your primary care provider (PCP), if your PCP is located in our provider database this encounter information will be shared with them immediately following your visit.  Consent: (Patient) Miranda DublinStephanie Lynn Fleming provided verbal consent for this virtual visit at the beginning of the encounter.  Current Medications:  Current Outpatient Medications:    azithromycin (ZITHROMAX) 250 MG tablet, Take 2 tablets on day 1, then 1 tablet daily on days 2 through 5, Disp: 6 tablet, Rfl: 0   benzonatate (TESSALON) 100 MG capsule, Take 1 capsule (100 mg total) by mouth 3 (three) times daily as needed., Disp: 30 capsule, Rfl: 0   promethazine-dextromethorphan (PROMETHAZINE-DM) 6.25-15 MG/5ML syrup, Take 5 mLs by mouth 4 (four) times daily as needed., Disp: 118 mL, Rfl: 0   ALPRAZolam (XANAX) 1 MG tablet, Take 1 mg by mouth every 8 (eight) hours as needed., Disp: , Rfl:    calcium carbonate (OS-CAL - DOSED IN MG OF ELEMENTAL CALCIUM) 1250 (500 Ca) MG tablet, Take 1 tablet by mouth daily with breakfast., Disp: , Rfl:    Clobetasol Propionate 0.05 % shampoo, Apply 1 application topically daily., Disp: , Rfl:    dicyclomine (BENTYL) 10 MG capsule, Take 1 capsule (10 mg total) by mouth 3 (three) times daily as needed for up to 14 days for spasms (abdominal cramping)., Disp: 12 capsule, Rfl: 0   estradiol (ESTRACE) 0.5 MG tablet, Take 0.5 mg by mouth daily., Disp: , Rfl:    fluconazole (DIFLUCAN) 200 MG tablet, Take 200 mg by mouth daily., Disp: , Rfl:    fluocinonide (LIDEX) 0.05 % external solution, Apply 1 application topically daily., Disp: , Rfl:    gabapentin (NEURONTIN) 300 MG capsule, Take 1 capsule by mouth at bedtime., Disp: , Rfl:    metoCLOPramide (REGLAN) 5 MG tablet, Take 5 mg by mouth every 6 (six) hours as needed for nausea., Disp: , Rfl:    Multiple Vitamin (MULTIVITAMIN)  tablet, Take 1 tablet by mouth daily., Disp: , Rfl:    nitrofurantoin, macrocrystal-monohydrate, (MACROBID) 100 MG capsule, Take 1 capsule by mouth in the morning and at bedtime., Disp: , Rfl:    Omega-3 Fatty Acids (FISH OIL) 1000 MG CAPS, Take 1 capsule by mouth daily., Disp: , Rfl:    ondansetron (ZOFRAN ODT) 8 MG disintegrating tablet, Take 1 tablet (8 mg total) by mouth every 8 (eight) hours as needed for nausea or vomiting., Disp: 12 tablet, Rfl: 0   traMADol (ULTRAM) 50 MG tablet, Take 50 mg by mouth every 6 (six) hours as needed., Disp: , Rfl:    valACYclovir (VALTREX) 1000 MG tablet, Take 1,000 mg by mouth 3 (three) times daily., Disp: , Rfl:    Vitamin D, Ergocalciferol, (DRISDOL) 1.25 MG (50000 UT) CAPS capsule, TAKE ONE CAPSULE BY MOUTH ONE TIME PER WEEK, Disp: 12 capsule, Rfl: 3   Medications ordered in this encounter:  Meds ordered this encounter  Medications   azithromycin (ZITHROMAX) 250 MG tablet    Sig: Take 2 tablets on day 1, then 1 tablet daily on days 2 through 5    Dispense:  6 tablet    Refill:  0    Order Specific Question:   Supervising Provider    Answer:   Hyacinth MeekerMILLER, BRIAN [[id number]]   benzonatate (TESSALON) 100 MG capsule    Sig: Take 1 capsule (100 mg total) by mouth 3 (three) times daily  as needed.    Dispense:  30 capsule    Refill:  0    Order Specific Question:   Supervising Provider    Answer:   Eber Hong [[id number]]   promethazine-dextromethorphan (PROMETHAZINE-DM) 6.25-15 MG/5ML syrup    Sig: Take 5 mLs by mouth 4 (four) times daily as needed.    Dispense:  118 mL    Refill:  0    Order Specific Question:   Supervising Provider    Answer:   Hyacinth Meeker, BRIAN [[id number]]     *If you need refills on other medications prior to your next appointment, please contact your pharmacy*  Follow-Up: Call back or seek an in-person evaluation if the symptoms worsen or if the condition fails to improve as anticipated.  Other Instructions Acute Bronchitis, Adult Acute  bronchitis is when air tubes in the lungs (bronchi) suddenly get swollen. The condition can make it hard for you to breathe. In adults, acute bronchitis usually goes away within 2 weeks. A cough caused by bronchitis may last up to 3 weeks. Smoking, allergies, and asthma can make the condition worse. What are the causes? Germs that cause cold and flu (viruses). The most common cause of this condition is the virus that causes the common cold. Bacteria. Substances that bother (irritate) the lungs, including: Smoke from cigarettes and other types of tobacco. Dust and pollen. Fumes from chemicals, gases, or burned fuel. Indoor or outdoor air pollution. What increases the risk? A weak body's defense system. This is also called the immune system. Any condition that affects your lungs and breathing, such as asthma. What are the signs or symptoms? A cough. Coughing up clear, yellow, or green mucus. Making high-pitched whistling sounds when you breathe, most often when you breathe out (wheezing). Runny or stuffy nose. Having too much mucus in your lungs (chest congestion). Shortness of breath. Body aches. A sore throat. How is this treated? Acute bronchitis may go away over time without treatment. Your doctor may tell you to: Drink more fluids. This will help thin your mucus so it is easier to cough up. Use a device that gets medicine into your lungs (inhaler). Use a vaporizer or a humidifier. These are machines that add water to the air. This helps with coughing and poor breathing. Take a medicine that thins mucus and helps clear it from your lungs. Take a medicine that prevents or stops coughing. It is not common to take an antibiotic medicine for this condition. Follow these instructions at home:  Take over-the-counter and prescription medicines only as told by your doctor. Use an inhaler, vaporizer, or humidifier as told by your doctor. Take two teaspoons (10 mL) of honey at bedtime. This  helps lessen your coughing at night. Drink enough fluid to keep your pee (urine) pale yellow. Do not smoke or use any products that contain nicotine or tobacco. If you need help quitting, ask your doctor. Get a lot of rest. Return to your normal activities when your doctor says that it is safe. Keep all follow-up visits. How is this prevented?  Wash your hands often with soap and water for at least 20 seconds. If you cannot use soap and water, use hand sanitizer. Avoid contact with people who have cold symptoms. Try not to touch your mouth, nose, or eyes with your hands. Avoid breathing in smoke or chemical fumes. Make sure to get the flu shot every year. Contact a doctor if: Your symptoms do not get better in 2 weeks. You have  trouble coughing up the mucus. Your cough keeps you awake at night. You have a fever. Get help right away if: You cough up blood. You have chest pain. You have very bad shortness of breath. You faint or keep feeling like you are going to faint. You have a very bad headache. Your fever or chills get worse. These symptoms may be an emergency. Get help right away. Call your local emergency services (911 in the [place] Do not wait to see if the symptoms will go away. Do not drive yourself to the hospital. Summary Acute bronchitis is when air tubes in the lungs (bronchi) suddenly get swollen. In adults, acute bronchitis usually goes away within 2 weeks. Drink more fluids. This will help thin your mucus so it is easier to cough up. Take over-the-counter and prescription medicines only as told by your doctor. Contact a doctor if your symptoms do not improve after 2 weeks of treatment. This information is not intended to replace advice given to you by your health care provider. Make sure you discuss any questions you have with your health care provider. Document Revised: 02/27/2021 Document Reviewed: 02/27/2021 Elsevier Patient Education  2022 [employer]   Sinusitis, Adult Sinusitis is soreness and swelling (inflammation) of your sinuses. Sinuses are hollow spaces in the bones around your face. They are located: Around your eyes. In the middle of your forehead. Behind your nose. In your cheekbones. Your sinuses and nasal passages are lined with a fluid called mucus. Mucus drains out of your sinuses. Swelling can trap mucus in your sinuses. This lets germs (bacteria, virus, or fungus) grow, which leads to infection. Most of the time, this condition is caused by a virus. What are the causes? This condition is caused by: Allergies. Asthma. Germs. Things that block your nose or sinuses. Growths in the nose (nasal polyps). Chemicals or irritants in the air. Fungus (rare). What increases the risk? You are more likely to develop this condition if: You have a weak body defense system (immune system). You do a lot of swimming or diving. You use nasal sprays too much. You smoke. What are the signs or symptoms? The main symptoms of this condition are pain and a feeling of pressure around the sinuses. Other symptoms include: Stuffy nose (congestion). Runny nose (drainage). Swelling and warmth in the sinuses. Headache. Toothache. A cough that may get worse at night. Mucus that collects in the throat or the back of the nose (postnasal drip). Being unable to smell and taste. Being very tired (fatigue). A fever. Sore throat. Bad breath. How is this diagnosed? This condition is diagnosed based on: Your symptoms. Your medical history. A physical exam. Tests to find out if your condition is short-term (acute) or long-term (chronic). Your doctor may: Check your nose for growths (polyps). Check your sinuses using a tool that has a light (endoscope). Check for allergies or germs. Do imaging tests, such as an MRI or CT scan. How is this treated? Treatment for this condition depends on the cause and whether it is short-term or  long-term. If caused by a virus, your symptoms should go away on their own within 10 days. You may be given medicines to relieve symptoms. They include: Medicines that shrink swollen tissue in the nose. Medicines that treat allergies (antihistamines). A spray that treats swelling of the nostrils.  Rinses that help get rid of thick mucus in your nose (nasal saline washes). If caused by bacteria, your doctor may wait to see if  you will get better without treatment. You may be given antibiotic medicine if you have: A very bad infection. A weak body defense system. If caused by growths in the nose, you may need to have surgery. Follow these instructions at home: Medicines Take, use, or apply over-the-counter and prescription medicines only as told by your doctor. These may include nasal sprays. If you were prescribed an antibiotic medicine, take it as told by your doctor. Do not stop taking the antibiotic even if you start to feel better. Hydrate and humidify  Drink enough water to keep your pee (urine) pale yellow. Use a cool mist humidifier to keep the humidity level in your home above 50%. Breathe in steam for 10-15 minutes, 3-4 times a day, or as told by your doctor. You can do this in the bathroom while a hot shower is running. Try not to spend time in cool or dry air. Rest Rest as much as you can. Sleep with your head raised (elevated). Make sure you get enough sleep each night. General instructions  Put a warm, moist washcloth on your face 3-4 times a day, or as often as told by your doctor. This will help with discomfort. Wash your hands often with soap and water. If there is no soap and water, use hand sanitizer. Do not smoke. Avoid being around people who are smoking (secondhand smoke). Keep all follow-up visits as told by your doctor. This is important. Contact a doctor if: You have a fever. Your symptoms get worse. Your symptoms do not get better within 10 days. Get help  right away if: You have a very bad headache. You cannot stop throwing up (vomiting). You have very bad pain or swelling around your face or eyes. You have trouble seeing. You feel confused. Your neck is stiff. You have trouble breathing. Summary Sinusitis is swelling of your sinuses. Sinuses are hollow spaces in the bones around your face. This condition is caused by tissues in your nose that become inflamed or swollen. This traps germs. These can lead to infection. If you were prescribed an antibiotic medicine, take it as told by your doctor. Do not stop taking it even if you start to feel better. Keep all follow-up visits as told by your doctor. This is important. This information is not intended to replace advice given to you by your health care provider. Make sure you discuss any questions you have with your health care provider. Document Revised: 03/29/2018 Document Reviewed: 03/29/2018 Elsevier Patient Education  2022 [employer].    If you have been instructed to have an in-person evaluation today at a local Urgent Care facility, please use the link below. It will take you to a list of all of our available [facility], including address, phone number and hours of operation. Please do not delay care.  [facility]  If you or a family member do not have a primary care provider, use the link below to schedule a visit and establish care. When you choose a [facility] primary care physician or advanced practice provider, you gain a long-term partner in health. Find a Primary Care Provider  Learn more about [facility]'s in-office and virtual care options: [facility] - Get Care Now

## 2022-01-20 NOTE — Progress Notes (Signed)
?Virtual Visit Consent  ? ?Miranda Fleming, you are scheduled for a virtual visit with a [facility] provider today.   ?  ?Just as with appointments in the office, your consent must be obtained to participate.  Your consent will be active for this visit and any virtual visit you may have with one of our providers in the next 365 days.   ?  ?If you have a MyChart account, a copy of this consent can be sent to you electronically.  All virtual visits are billed to your insurance company just like a traditional visit in the office.   ? ?As this is a virtual visit, video technology does not allow for your provider to perform a traditional examination.  This may limit your provider's ability to fully assess your condition.  If your provider identifies any concerns that need to be evaluated in person or the need to arrange testing (such as labs, EKG, etc.), we will make arrangements to do so.   ?  ?Although advances in technology are sophisticated, we cannot ensure that it will always work on either your end or our end.  If the connection with a video visit is poor, the visit may have to be switched to a telephone visit.  With either a video or telephone visit, we are not always able to ensure that we have a secure connection.    ? ?I need to obtain your verbal consent now.   Are you willing to proceed with your visit today?  ?  ?Miranda Fleming has provided verbal consent on 01/20/2022 for a virtual visit (video or telephone). ?  ?Miranda LovelessJennifer M Quetzali Heinle, PA-C  ? ?Date: 01/20/2022 3:48 PM ? ? ?Virtual Visit via Video Note  ? ?IMargaretann Fleming, Miranda Fleming, connected with  Miranda Fleming  ([id number], 03/13/1966) on 01/20/22 at  3:45 PM EDT by a video-enabled telemedicine application and verified that I am speaking with the correct person using two identifiers. ? ?Location: ?Patient: Virtual Visit Location Patient: Home ?Provider: Virtual Visit Location Provider: Home Office ?  ?I discussed the limitations of  evaluation and management by telemedicine and the availability of in person appointments. The patient expressed understanding and agreed to proceed.   ? ?History of Present Illness: ?Miranda Fleming is a 56 y.o. who identifies as a female who was assigned female at birth, and is being seen today for URI symptoms. ? ?HPI: Cough ?This is a new problem. The current episode started 1 to 4 weeks ago. The problem has been gradually worsening. The problem occurs hourly. The cough is Productive of sputum. Associated symptoms include chest pain, chills, ear congestion, a fever, headaches, myalgias, nasal congestion, postnasal drip, rhinorrhea and a sore throat. Pertinent negatives include no ear pain. The symptoms are aggravated by lying down. Treatments tried: sudafed, tylenol. The treatment provided no relief.   ? ? ?Problems:  ?Patient Active Problem List  ? Diagnosis Date Noted  ? Palpitations 11/23/2015  ? DVT of lower limb, acute (HCC) 10/09/2015  ? Stress incontinence in female 10/31/2013  ? Ovarian cyst   ? Anxiety   ?  ?Allergies:  ?Allergies  ?Allergen Reactions  ? Amoxicillin Rash  ? Latex Rash  ? ?Medications:  ?Current Outpatient Medications:  ?  azithromycin (ZITHROMAX) 250 MG tablet, Take 2 tablets on day 1, then 1 tablet daily on days 2 through 5, Disp: 6 tablet, Rfl: 0 ?  benzonatate (TESSALON) 100 MG capsule, Take 1 capsule (100 mg total) by  mouth 3 (three) times daily as needed., Disp: 30 capsule, Rfl: 0 ?  promethazine-dextromethorphan (PROMETHAZINE-DM) 6.25-15 MG/5ML syrup, Take 5 mLs by mouth 4 (four) times daily as needed., Disp: 118 mL, Rfl: 0 ?  ALPRAZolam (XANAX) 1 MG tablet, Take 1 mg by mouth every 8 (eight) hours as needed., Disp: , Rfl:  ?  calcium carbonate (OS-CAL - DOSED IN MG OF ELEMENTAL CALCIUM) 1250 (500 Ca) MG tablet, Take 1 tablet by mouth daily with breakfast., Disp: , Rfl:  ?  Clobetasol Propionate 0.05 % shampoo, Apply 1 application topically daily., Disp: , Rfl:  ?   dicyclomine (BENTYL) 10 MG capsule, Take 1 capsule (10 mg total) by mouth 3 (three) times daily as needed for up to 14 days for spasms (abdominal cramping)., Disp: 12 capsule, Rfl: 0 ?  estradiol (ESTRACE) 0.5 MG tablet, Take 0.5 mg by mouth daily., Disp: , Rfl:  ?  fluconazole (DIFLUCAN) 200 MG tablet, Take 200 mg by mouth daily., Disp: , Rfl:  ?  fluocinonide (LIDEX) 0.05 % external solution, Apply 1 application topically daily., Disp: , Rfl:  ?  gabapentin (NEURONTIN) 300 MG capsule, Take 1 capsule by mouth at bedtime., Disp: , Rfl:  ?  metoCLOPramide (REGLAN) 5 MG tablet, Take 5 mg by mouth every 6 (six) hours as needed for nausea., Disp: , Rfl:  ?  Multiple Vitamin (MULTIVITAMIN) tablet, Take 1 tablet by mouth daily., Disp: , Rfl:  ?  nitrofurantoin, macrocrystal-monohydrate, (MACROBID) 100 MG capsule, Take 1 capsule by mouth in the morning and at bedtime., Disp: , Rfl:  ?  Omega-3 Fatty Acids (FISH OIL) 1000 MG CAPS, Take 1 capsule by mouth daily., Disp: , Rfl:  ?  ondansetron (ZOFRAN ODT) 8 MG disintegrating tablet, Take 1 tablet (8 mg total) by mouth every 8 (eight) hours as needed for nausea or vomiting., Disp: 12 tablet, Rfl: 0 ?  traMADol (ULTRAM) 50 MG tablet, Take 50 mg by mouth every 6 (six) hours as needed., Disp: , Rfl:  ?  valACYclovir (VALTREX) 1000 MG tablet, Take 1,000 mg by mouth 3 (three) times daily., Disp: , Rfl:  ?  Vitamin D, Ergocalciferol, (DRISDOL) 1.25 MG (50000 UT) CAPS capsule, TAKE ONE CAPSULE BY MOUTH ONE TIME PER WEEK, Disp: 12 capsule, Rfl: 3 ? ?Observations/Objective: ?Patient is well-developed, well-nourished in no acute distress.  ?Resting comfortably at home.  ?Head is normocephalic, atraumatic.  ?No labored breathing.  ?Speech is clear and coherent with logical content.  ?Patient is alert and oriented at baseline.  ? ? ?Assessment and Plan: ?1. Acute bacterial bronchitis ?- azithromycin (ZITHROMAX) 250 MG tablet; Take 2 tablets on day 1, then 1 tablet daily on days 2 through  5  Dispense: 6 tablet; Refill: 0 ?- benzonatate (TESSALON) 100 MG capsule; Take 1 capsule (100 mg total) by mouth 3 (three) times daily as needed.  Dispense: 30 capsule; Refill: 0 ?- promethazine-dextromethorphan (PROMETHAZINE-DM) 6.25-15 MG/5ML syrup; Take 5 mLs by mouth 4 (four) times daily as needed.  Dispense: 118 mL; Refill: 0 ? ?2. Acute bacterial sinusitis ? ?- Worsening despite OTC management over a week ?- Will treat with zpack, promethazine dm and tessalon perles.  ?- Steam and humidifier can help ?- Push fluids.  ?- Rest.  ?- Seek in person evaluation if worsening or fails to improve  ? ? ?Follow Up Instructions: ?I discussed the assessment and treatment plan with the patient. The patient was provided an opportunity to ask questions and all were answered. The patient agreed with  the plan and demonstrated an understanding of the instructions.  A copy of instructions were sent to the patient via MyChart unless otherwise noted below.  ? ?The patient was advised to call back or seek an in-person evaluation if the symptoms worsen or if the condition fails to improve as anticipated. ? ?Time:  ?I spent 12 minutes with the patient via telehealth technology discussing the above problems/concerns.   ? ?Miranda Loveless, PA-C ?

## 2022-04-26 ENCOUNTER — Telehealth: Payer: BC Managed Care – PPO | Admitting: Nurse Practitioner

## 2022-04-26 DIAGNOSIS — B9689 Other specified bacterial agents as the cause of diseases classified elsewhere: Secondary | ICD-10-CM | POA: Diagnosis not present

## 2022-04-26 DIAGNOSIS — J028 Acute pharyngitis due to other specified organisms: Secondary | ICD-10-CM | POA: Diagnosis not present

## 2022-04-26 DIAGNOSIS — H60333 Swimmer's ear, bilateral: Secondary | ICD-10-CM

## 2022-04-26 MED ORDER — AZITHROMYCIN 250 MG PO TABS: ORAL_TABLET | ORAL | 0 refills | Status: AC

## 2022-04-26 MED ORDER — IBUPROFEN 600 MG PO TABS: 600.0000 mg | ORAL_TABLET | Freq: Three times a day (TID) | ORAL | 0 refills | Status: DC | PRN

## 2022-04-26 MED ORDER — CIPRO HC 0.2-1 % OT SUSP: 3.0000 [drp] | Freq: Two times a day (BID) | OTIC | 0 refills | Status: DC

## 2022-04-26 NOTE — Progress Notes (Signed)
E Visit for Swimmer's Ear  We are sorry that you are not feeling well. Here is how we plan to help!  Based on what you have shared with me it looks like you have swimmers ear. Swimmer's ear is a redness or swelling, irritation, or infection of your outer ear canal.  These symptoms usually occur within a few days of swimming.  Your ear canal is a tube that goes from the opening of the ear to the eardrum.  When water stays in your ear canal, germs can grow.  This is a painful condition that often happens to children and swimmers of all ages.  It is not contagious and oral antibiotics are not required to treat uncomplicated swimmer's ear.  The usual symptoms include: Itching inside the ear, Redness or a sense of swelling in the ear, Pain when the ear is tugged on when pressure is placed on the ear, Pus draining from the infected ear. and I have prescribed: Ciprofloxin 0.2% and hydrocortisone 1% otic suspension 3 drops in affected ears twice daily for 7 days   In certain cases swimmer's ear may progress to a more serious bacterial infection of the middle or inner ear.  If you have a fever 102 and up and significantly worsening symptoms, this could indicate a more serious infection moving to the middle/inner and needs face to face evaluation in an office by a provider.  Your symptoms should improve over the next 3 days and should resolve in about 7 days.  HOME CARE:  Wash your hands frequently. Do not place the tip of the bottle on your ear or touch it with your fingers. You can take Acetominophen 650 mg every 4-6 hours as needed for pain.  If pain is severe or moderate, you can apply a heating pad (set on low) or hot water bottle (wrapped in a towel) to outer ear for 20 minutes.  This will also increase drainage. Avoid ear plugs Do not use Q-tips After showers, help the water run out by tilting your head to one side.  GET HELP RIGHT AWAY IF:  Fever is over 102.2 degrees. You develop progressive  ear pain or hearing loss. Ear symptoms persist longer than 3 days after treatment.  MAKE SURE YOU:  Understand these instructions. Will watch your condition. Will get help right away if you are not doing well or get worse.  TO PREVENT SWIMMER'S EAR: Use a bathing cap or custom fitted swim molds to keep your ears dry. Towel off after swimming to dry your ears. Tilt your head or pull your earlobes to allow the water to escape your ear canal. If there is still water in your ears, consider using a hairdryer on the lowest setting.   Thank you for choosing an e-visit.  Your e-visit answers were reviewed by a board certified advanced clinical practitioner to complete your personal care plan. Depending upon the condition, your plan could have included both over the counter or prescription medications.  Please review your pharmacy choice. Make sure the pharmacy is open so you can pick up prescription now. If there is a problem, you may contact your provider through MyChart messaging and have the prescription routed to another pharmacy.  Your safety is important to us. If you have drug allergies check your prescription carefully.   For the next 24 hours you can use MyChart to ask questions about today's visit, request a non-urgent call back, or ask for a work or school excuse. You will get   an email in the next two days asking about your experience. I hope that your e-visit has been valuable and will speed your recovery.    

## 2022-04-26 NOTE — Progress Notes (Signed)
I have spent 5 minutes in review of e-visit questionnaire, review and updating patient chart, medical decision making and response to patient.  ° °Zoe Goonan W Davonna Ertl, NP ° °  °

## 2022-04-26 NOTE — Progress Notes (Signed)
E-Visit for Sore Throat - Strep Symptoms  We are sorry that you are not feeling well.  Here is how we plan to help!  Based on what you have shared with me it is likely that you have strep pharyngitis.  Strep pharyngitis is inflammation and infection in the back of the throat.  This is an infection cause by bacteria and is treated with antibiotics.  I have prescribed Azithromycin 250 mg two tablets today and then one daily for 4 additional days and Ibuprofen 600 mg #28 four times a day for 7 days if needed for pain and or fever control. For throat pain, we recommend over the counter oral pain relief medications such as acetaminophen or aspirin, or anti-inflammatory medications such as ibuprofen or naproxen sodium. Topical treatments such as oral throat lozenges or sprays may be used as needed. Strep infections are not as easily transmitted as other respiratory infections, however we still recommend that you avoid close contact with loved ones, especially the very young and elderly.  Remember to wash your hands thoroughly throughout the day as this is the number one way to prevent the spread of infection and wipe down door knobs and counters with disinfectant.   Home Care: Only take medications as instructed by your medical team. Complete the entire course of an antibiotic. Do not take these medications with alcohol. A steam or ultrasonic humidifier can help congestion.  You can place a towel over your head and breathe in the steam from hot water coming from a faucet. Avoid close contacts especially the very young and the elderly. Cover your mouth when you cough or sneeze. Always remember to wash your hands.  Get Help Right Away If: You develop worsening fever or sinus pain. You develop a severe head ache or visual changes. Your symptoms persist after you have completed your treatment plan.  Make sure you Understand these instructions. Will watch your condition. Will get help right away if you  are not doing well or get worse.   Thank you for choosing an e-visit.  Your e-visit answers were reviewed by a board certified advanced clinical practitioner to complete your personal care plan. Depending upon the condition, your plan could have included both over the counter or prescription medications.  Please review your pharmacy choice. Make sure the pharmacy is open so you can pick up prescription now. If there is a problem, you may contact your provider through [employer] and have the prescription routed to another pharmacy.  Your safety is important to [place]. If you have drug allergies check your prescription carefully.   For the next 24 hours you can use MyChart to ask questions about today's visit, request a non-urgent call back, or ask for a work or school excuse. You will get an email in the next two days asking about your experience. I hope that your e-visit has been valuable and will speed your recovery.

## 2022-04-26 NOTE — Progress Notes (Signed)
I have spent 5 minutes in review of e-visit questionnaire, review and updating patient chart, medical decision making and response to patient.  ° °Charika Mikelson W Jaquarius Seder, NP ° °  °

## 2022-05-25 IMAGING — CT CT HEAD W/O CM
3 series · 15 of 46 positions shown, 18 images · non-contrast
Comparison: CT brain 12/26/2017
COMPARISON: CT brain 12/26/2017

Addendum:
CLINICAL DATA: Mental status change fall

EXAM:
CT HEAD WITHOUT CONTRAST
TECHNIQUE: Contiguous axial images were obtained from the base of the skull
through the vertex without intravenous contrast.

[Series 2: head wo · axial · 0.43mm/px · z∈[+590,+710]mm · 9 of 29 slices shown, 12 images]
[im 3/29  brain]
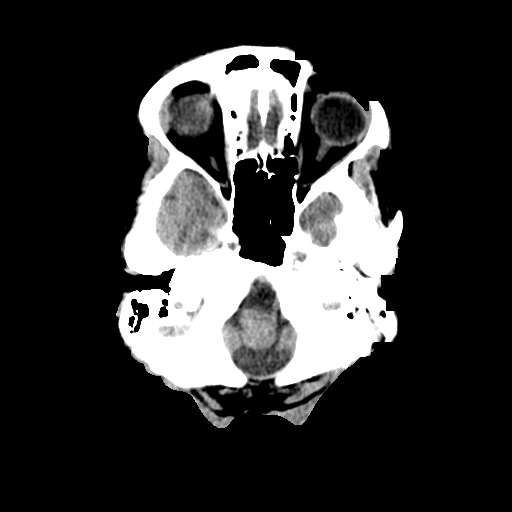
[im 3/29  bone]
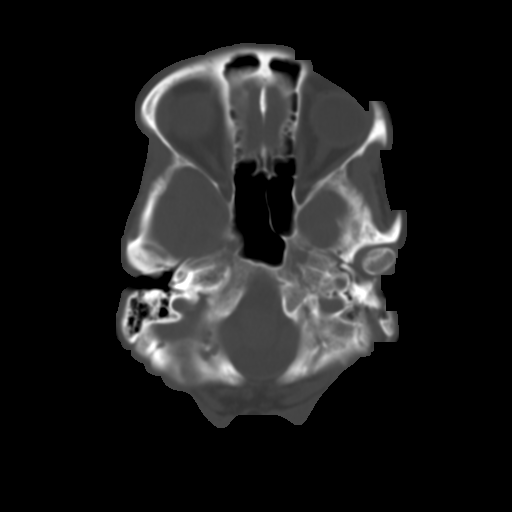
[im 6/29  brain]
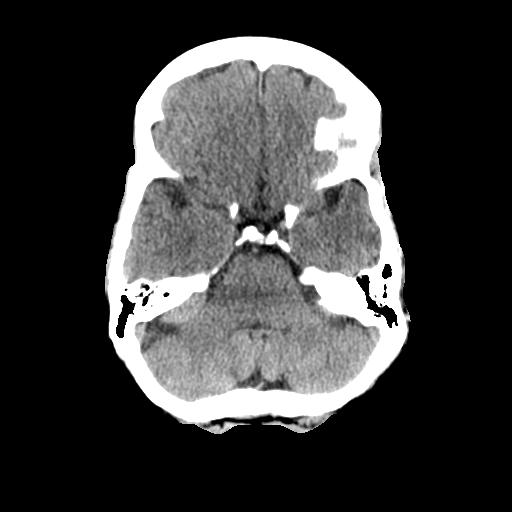
[im 9/29  brain]
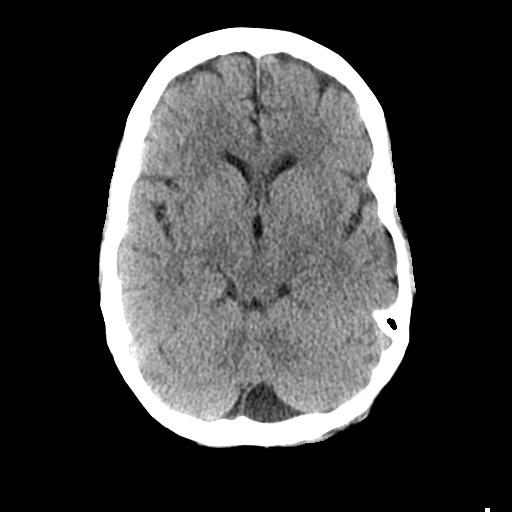
[im 12/29  brain]
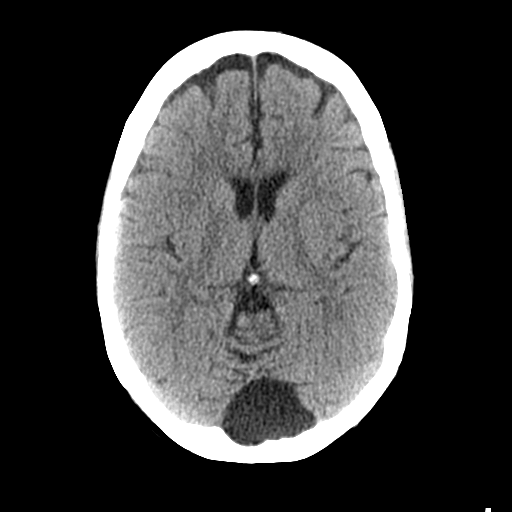
[im 15/29  brain]
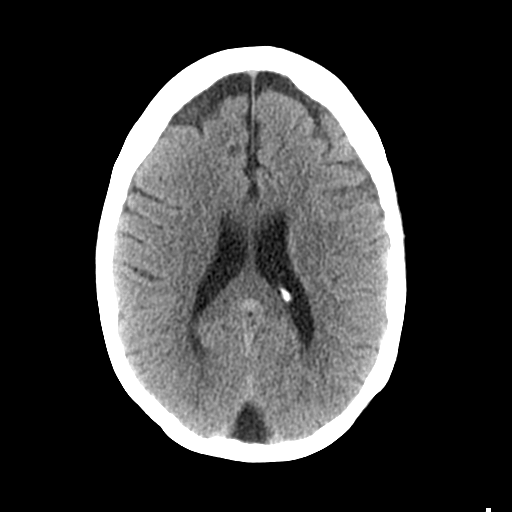
[im 15/29  bone]
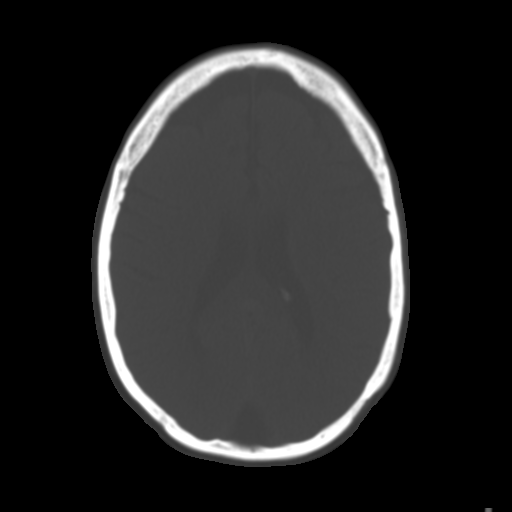
[im 18/29  brain]
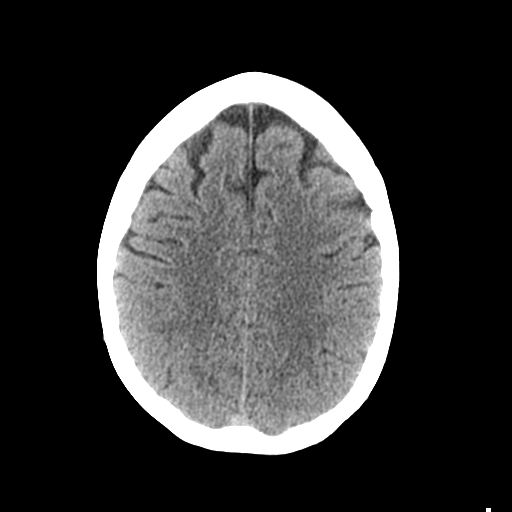
[im 21/29  brain]
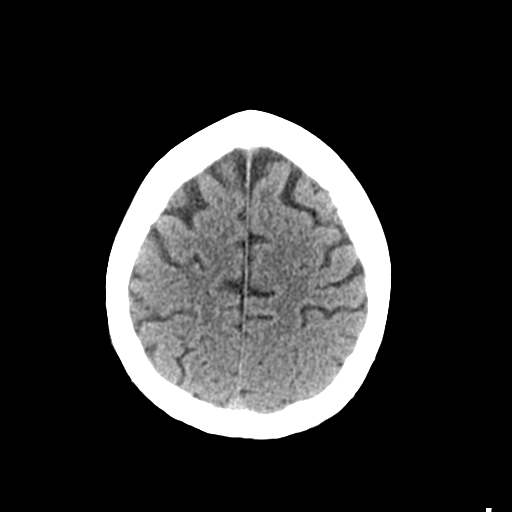
[im 24/29  brain]
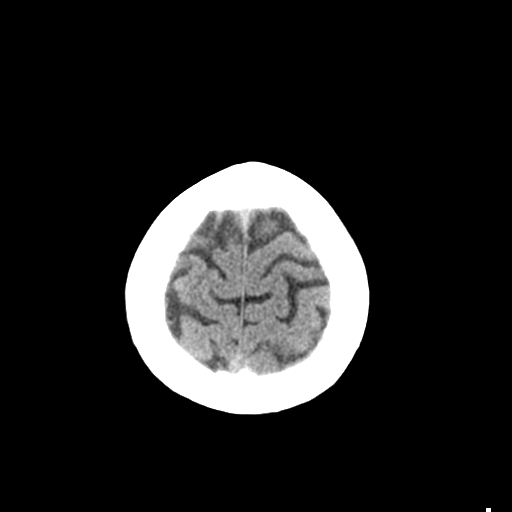
[im 27/29  brain]
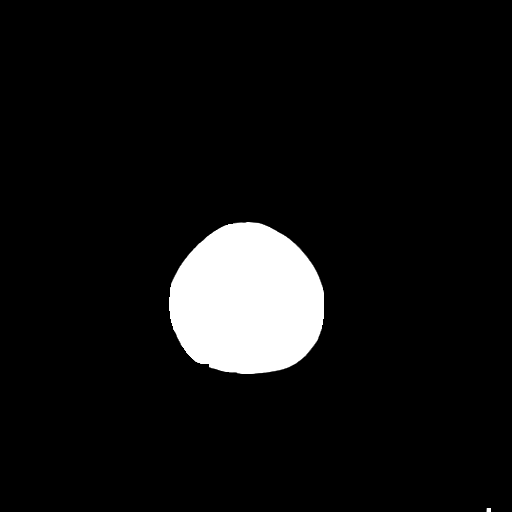
[im 27/29  bone]
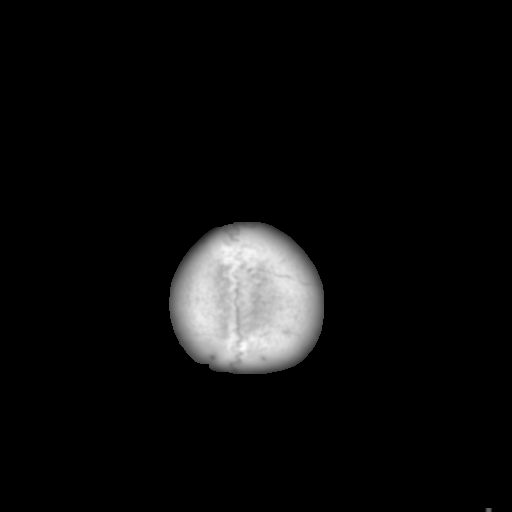

[Series 4: coronal soft tissue · coronal · 0.29mm/px · 3 of 63 slices shown]
[im 21/63  brain]
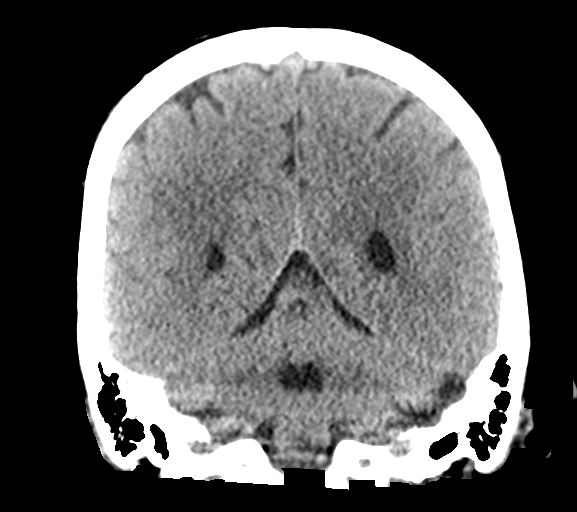
[im 28/63  brain]
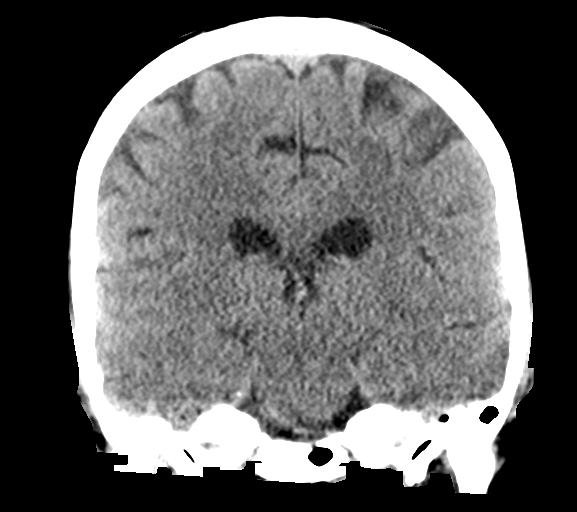
[im 35/63  brain]
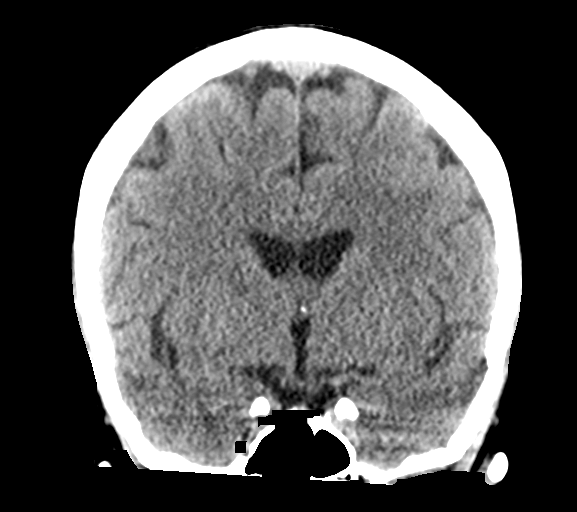

[Series 5: sagittal soft tissue · sagittal · 0.32mm/px · 3 of 52 slices shown]
[im 18/52  brain]
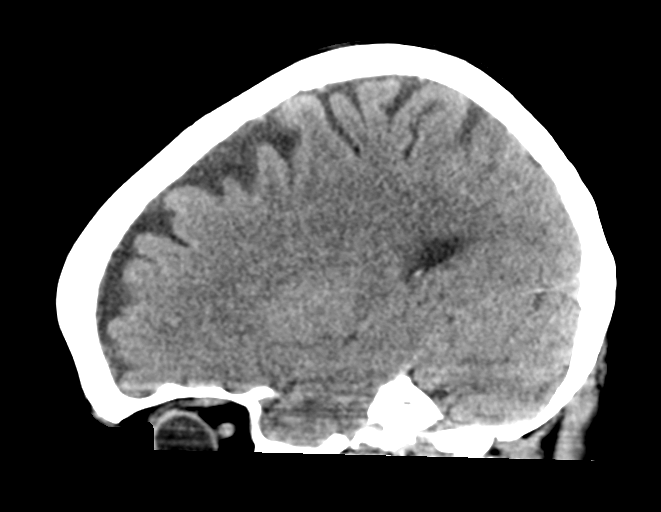
[im 26/52  brain]
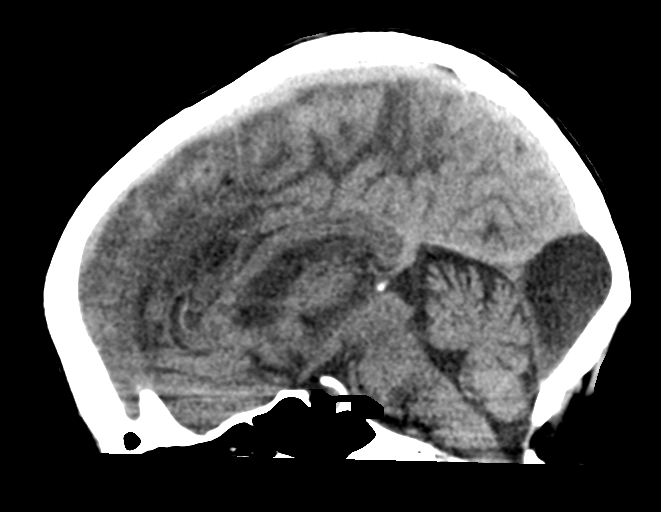
[im 35/52  brain]
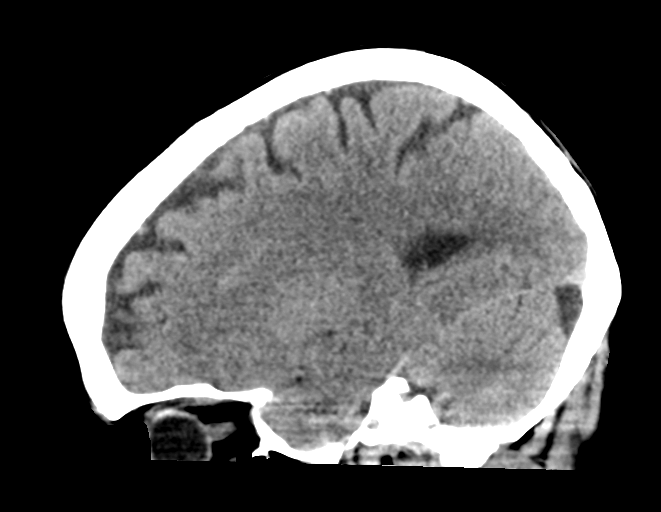

[15 of 46 positions shown; findings below may reference images not displayed]

FINDINGS: Brain: No acute territorial infarction, hemorrhage or intracranial
mass. The ventricles are nonenlarged.

Vascular: No hyperdense vessel or unexpected calcification.

Skull: Normal. Negative for fracture or focal lesion.

Sinuses/Orbits: No acute finding.

Other: None
IMPRESSION: Negative non contrasted CT appearance of the brain

ADDENDUM:
Retro cerebellar CSF density mass consistent with arachnoid cyst, no
change

*** End of Addendum ***
FINDINGS: Brain: No acute territorial infarction, hemorrhage or intracranial
mass. The ventricles are nonenlarged.

Vascular: No hyperdense vessel or unexpected calcification.

Skull: Normal. Negative for fracture or focal lesion.

Sinuses/Orbits: No acute finding.

Other: None
IMPRESSION: Negative non contrasted CT appearance of the brain

## 2022-05-25 IMAGING — CR DG ANKLE COMPLETE 3+V*L*
3 series · 3 of 3 positions shown · non-contrast
Comparison: None.

CLINICAL DATA: Pain after fall

EXAM:
LEFT ANKLE COMPLETE - 3+ VIEW

[ankle ap]
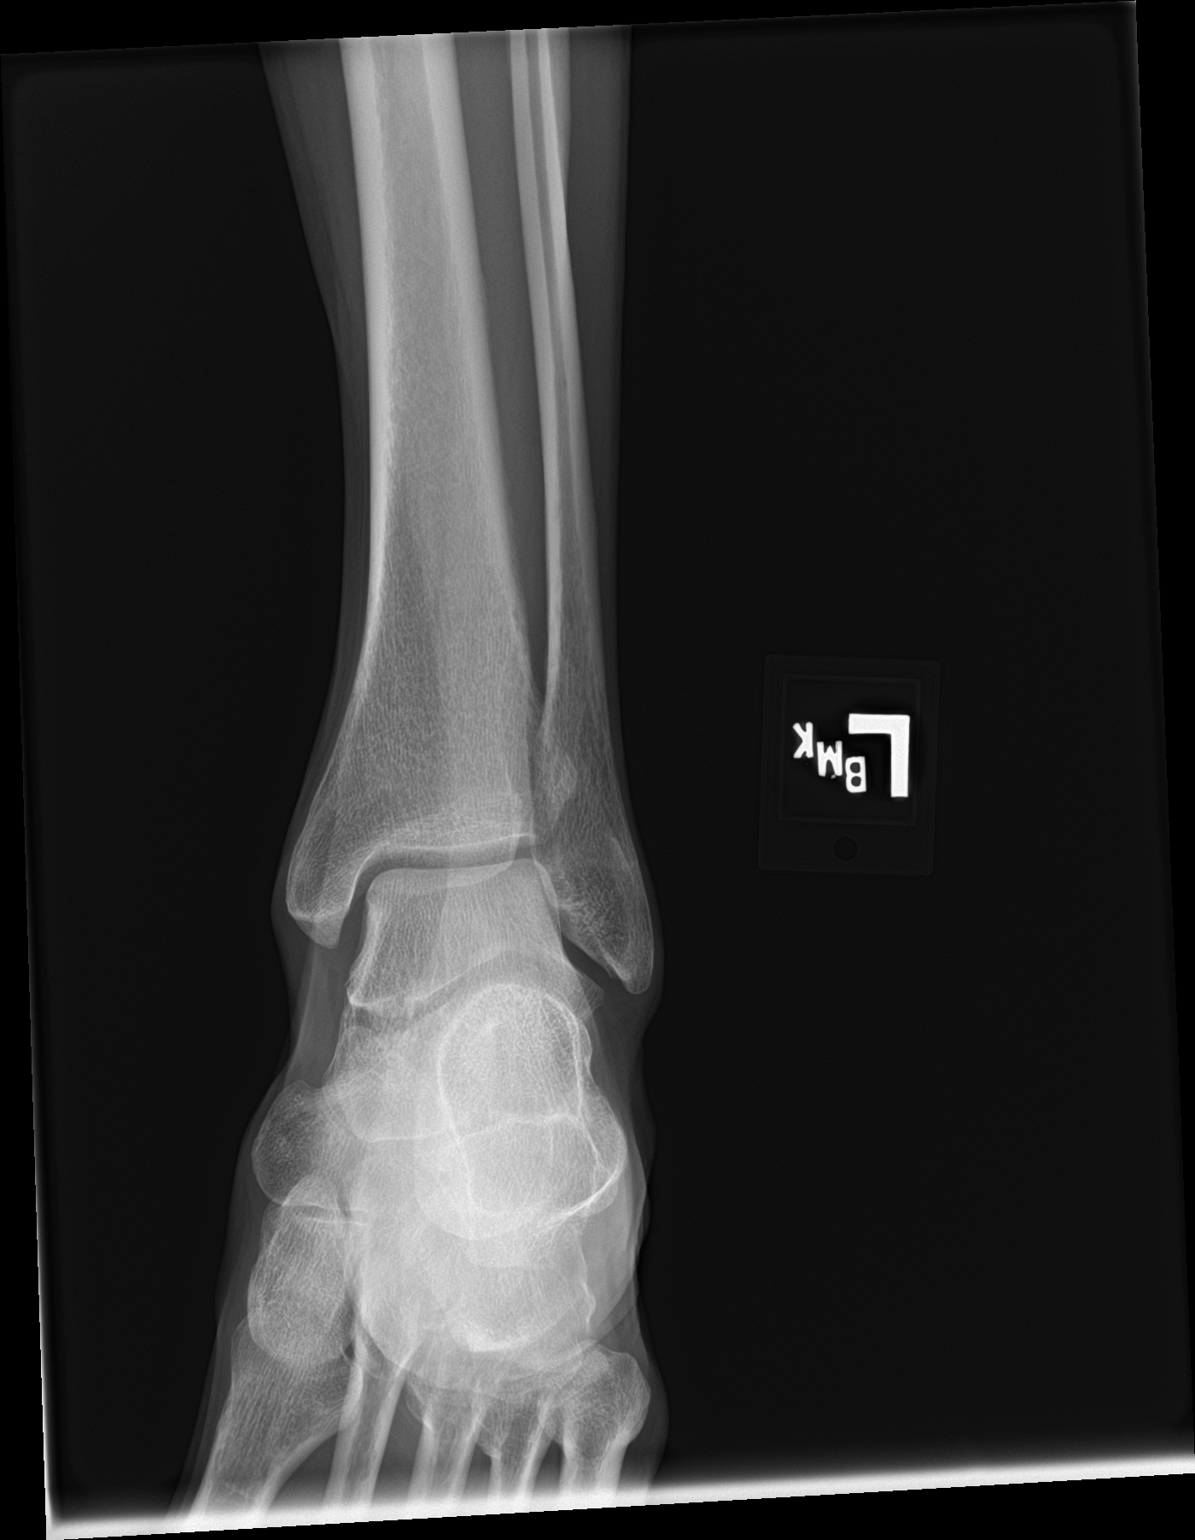

[ankle obl]
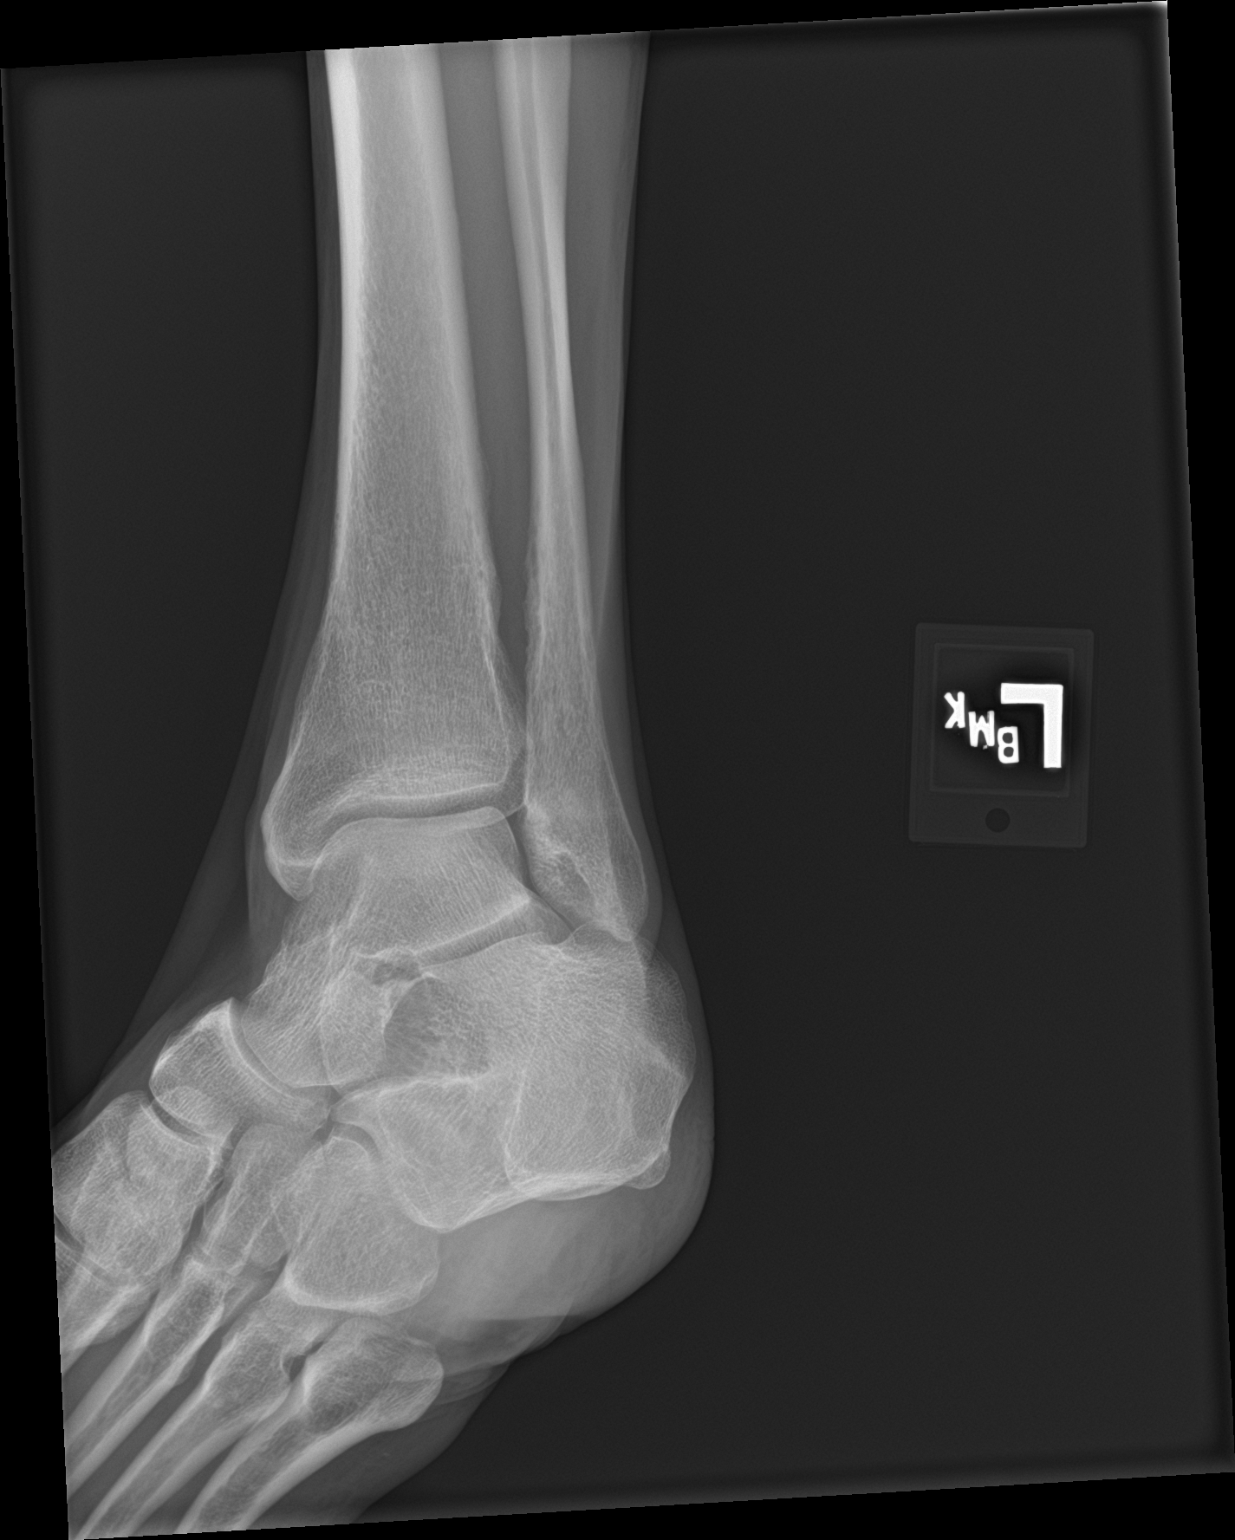

[ankle lat]
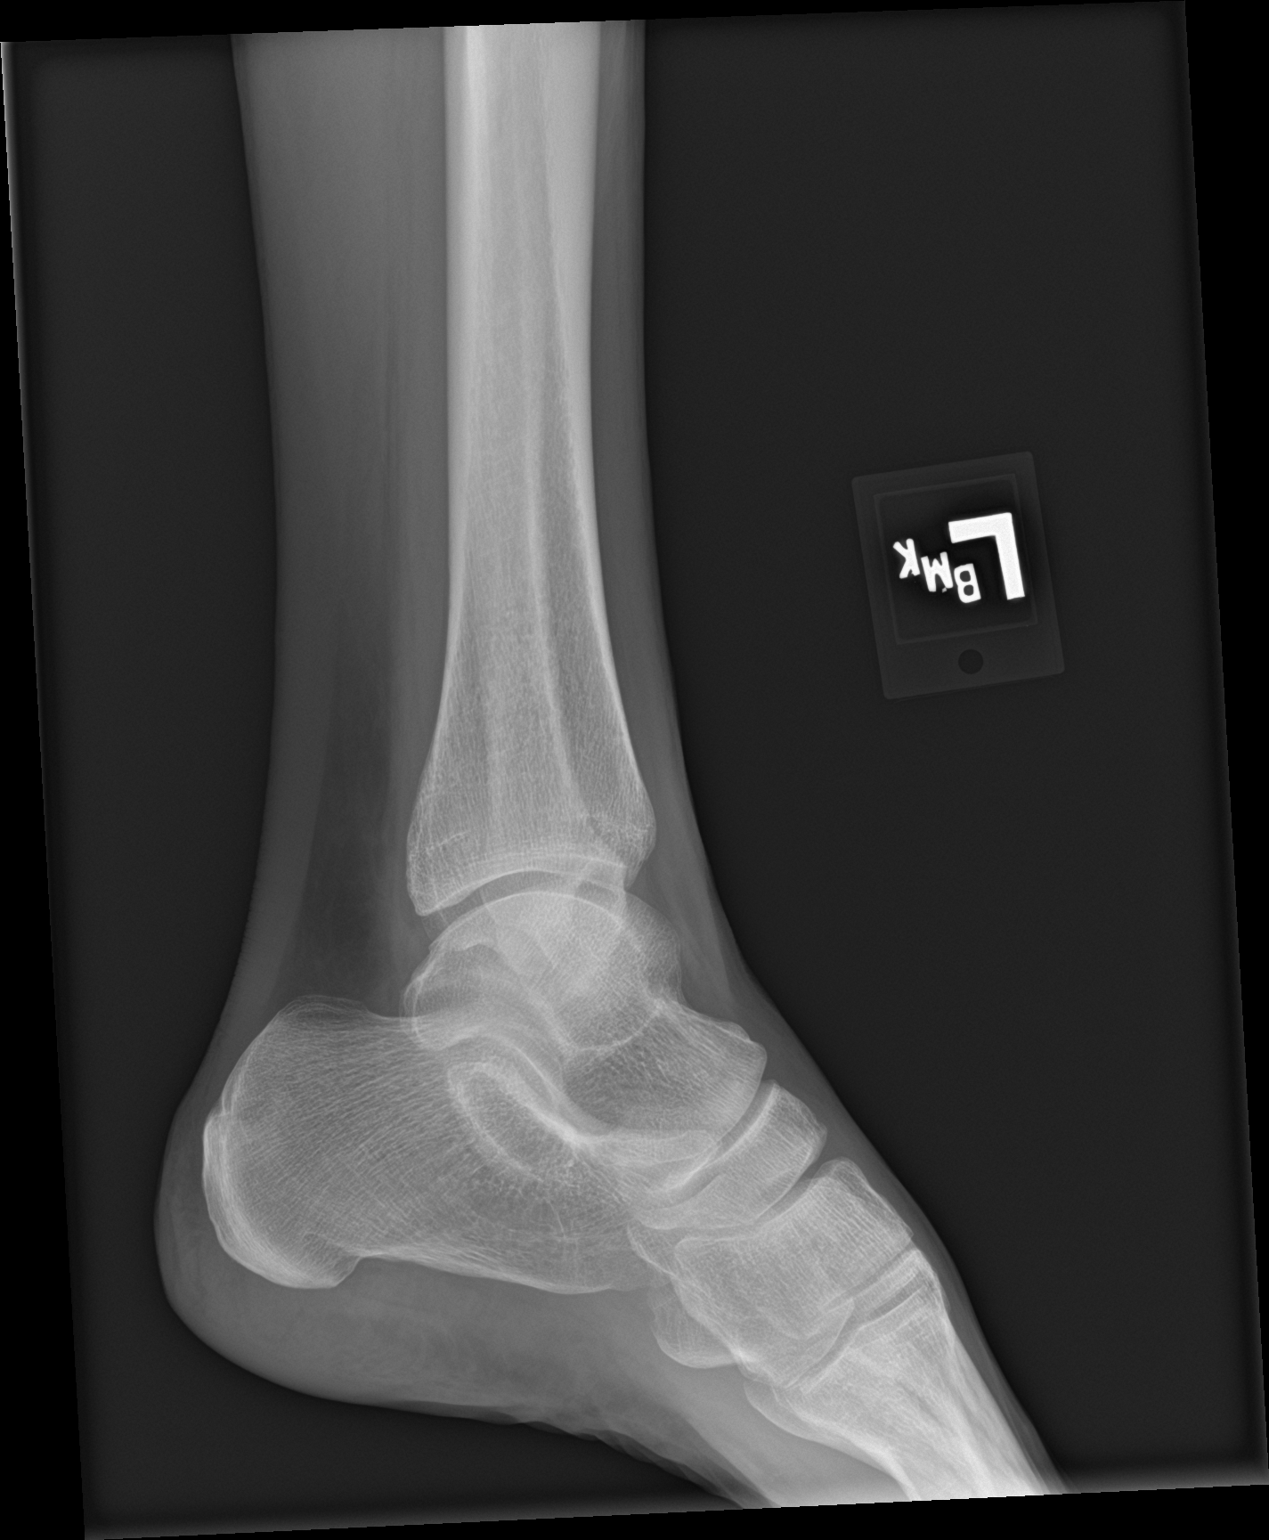

[3 of 3 positions shown; findings below may reference images not displayed]

FINDINGS: There is no evidence of fracture, dislocation, or joint effusion.
There is no evidence of arthropathy or other focal bone abnormality.
Soft tissues are unremarkable.
IMPRESSION: Negative.

## 2022-06-21 ENCOUNTER — Emergency Department
Admission: EM | Admit: 2022-06-21 | Discharge: 2022-06-21 | Disposition: A | Discharge status: Home / Self Care | Payer: BC Managed Care – PPO | Attending: Emergency Medicine | Admitting: Emergency Medicine

## 2022-06-21 ENCOUNTER — Other Ambulatory Visit: Payer: Self-pay

## 2022-06-21 ENCOUNTER — Emergency Department: Payer: BC Managed Care – PPO

## 2022-06-21 DIAGNOSIS — M7989 Other specified soft tissue disorders: Secondary | ICD-10-CM | POA: Diagnosis not present

## 2022-06-21 DIAGNOSIS — R531 Weakness: Secondary | ICD-10-CM | POA: Diagnosis present

## 2022-06-21 DIAGNOSIS — G5631 Lesion of radial nerve, right upper limb: Secondary | ICD-10-CM | POA: Insufficient documentation

## 2022-06-21 NOTE — ED Provider Notes (Signed)
[facility] Provider Note   Event Date/Time   First MD Initiated Contact with Patient 06/21/22 1258     (approximate) History  Numbness  HPI Miranda Fleming is a 56 y.o. female who presents for weakness/numbness to the right upper extremity.  Patient states that when she awoke today morning she noticed the inability to lift her right wrist as well as inability to grip with the thumb and forefinger.  Patient also states that she had some numbness over the third through fifth digit as well but was able to move them.  Patient states that the symptoms have slightly improved over the last 24 hours but was concerned that this may be a blood clot as she has had a blood clot in the left lower extremity in the past after surgery.  Patient is not on any anticoagulation at this time ROS: Patient currently denies any vision changes, tinnitus, difficulty speaking, facial droop, sore throat, chest pain, shortness of breath, abdominal pain, nausea/vomiting/diarrhea, dysuria   Physical Exam  Triage Vital Signs: ED Triage Vitals  Enc Vitals Group     BP 06/21/22 1221 (!) 126/57     Pulse Rate 06/21/22 1214 72     Resp 06/21/22 1214 16     Temp 06/21/22 1214 98.1 F (36.7 C)     Temp src --      SpO2 06/21/22 1214 100 %     Weight 06/21/22 1215 112 lb (50.8 kg)     Height 06/21/22 1215 5\' 5"  (1.651 m)     Head Circumference --      Peak Flow --      Pain Score 06/21/22 1214 0     Pain Loc --      Pain Edu? --      Excl. in GC? --    Most recent vital signs: Vitals:   06/21/22 1500 06/21/22 1530  BP: 119/67 (!) 114/53  Pulse: 67 66  Resp: 18 17  Temp:    SpO2: 98% 98%   General: Awake, oriented x4. CV:  Good peripheral perfusion.  Resp:  Normal effort.  Abd:  No distention.  Other:  Middle-aged Caucasian female laying in bed in no acute distress.  Right upper extremity with decreased sensation over the forearm down to the hand.  Decreased grip strength at  the thumb and first digit as well as the inability to extend the right wrist.  There is 4/5 strength in grip of the third through fifth digits on the right hand ED Results / Procedures / Treatments  PROCEDURES: Critical Care performed: No Procedures MEDICATIONS ORDERED IN ED: Medications - No data to display IMPRESSION / MDM / ASSESSMENT AND PLAN / ED COURSE  I reviewed the triage vital signs and the nursing notes.                             The patient is on the cardiac monitor to evaluate for evidence of arrhythmia and/or significant heart rate changes. Patient's presentation is most consistent with acute presentation with potential threat to life or bodily function. Presents with sensation of numbness and weakness to the right forearm and hand.  Patients symptoms and work-up not consistent with a stroke and therefore they will be discharged from the ED.  Patient has currently been stabilized in the emergency department.  Patient received Doppler ultrasound of the right upper extremity that does not show any evidence of  acute DVT Patient has wrist drop and decreased pinch strength consistent with radial nerve palsy.  This fits with patient's story as well of waking up with the symptoms.  Discussed these findings with patient and all questions were answered as well as given instructions for neurology follow-up if symptoms did not improve.  Patient instructed to continue wearing her wrist brace for comfort alone.  Patients symptoms not typical for other emergent causes such as dissection, infection, DKA, trauma. Patient will be discharged with strict return precautions and follow up with primary MD within 24 hours for further evaluation.   FINAL CLINICAL IMPRESSION(S) / ED DIAGNOSES   Final diagnoses:  Radial nerve palsy, right   Rx / DC Orders   ED Discharge Orders     None      Note:  This document was prepared using Dragon voice recognition software and may include  unintentional dictation errors.   Merwyn Katos, MD 06/21/22 Windell Moment

## 2022-06-21 NOTE — ED Triage Notes (Signed)
Pt to ED for numbness to right wrist that started yesterday. Pt reports difficult moving right wrist. Denies other sx. Ambualtory with stead gait, RR even and unlabored, NAD noted

## 2022-06-24 ENCOUNTER — Encounter: Payer: Self-pay | Admitting: Intensive Care

## 2022-06-24 ENCOUNTER — Other Ambulatory Visit: Payer: Self-pay

## 2022-06-24 ENCOUNTER — Emergency Department: Payer: BC Managed Care – PPO

## 2022-06-24 ENCOUNTER — Emergency Department
Admission: EM | Admit: 2022-06-24 | Discharge: 2022-06-24 | Disposition: A | Discharge status: Home / Self Care | Payer: BC Managed Care – PPO | Attending: Emergency Medicine | Admitting: Emergency Medicine

## 2022-06-24 DIAGNOSIS — Y92009 Unspecified place in unspecified non-institutional (private) residence as the place of occurrence of the external cause: Secondary | ICD-10-CM | POA: Diagnosis not present

## 2022-06-24 DIAGNOSIS — W010XXA Fall on same level from slipping, tripping and stumbling without subsequent striking against object, initial encounter: Secondary | ICD-10-CM | POA: Insufficient documentation

## 2022-06-24 DIAGNOSIS — S2242XA Multiple fractures of ribs, left side, initial encounter for closed fracture: Secondary | ICD-10-CM | POA: Insufficient documentation

## 2022-06-24 DIAGNOSIS — S299XXA Unspecified injury of thorax, initial encounter: Secondary | ICD-10-CM | POA: Diagnosis present

## 2022-06-24 MED ORDER — OXYCODONE HCL 5 MG PO TABS: 5.0000 mg | ORAL_TABLET | Freq: Four times a day (QID) | ORAL | 0 refills | Status: DC | PRN

## 2022-06-24 MED ORDER — OXYCODONE HCL 5 MG PO TABS
5.0000 mg | ORAL_TABLET | Freq: Once | ORAL | Status: AC
  Administered 2022-06-24: 5 mg via ORAL
  Filled 2022-06-24: qty 1

## 2022-06-24 NOTE — ED Triage Notes (Signed)
Patient slipped in coffee creamer she spilt this morning and fell. C/o left rib cage pain.

## 2022-06-24 NOTE — Discharge Instructions (Addendum)
Call make an appointment with your primary care provider if any continued problems or concerns.  You have 2 fractured ribs on the left which can be painful for 4 to 5 weeks while it is healing.  Use a pillow to place against your ribs anytime you are taking deep breath or coughing which will help support your ribs.  Also ice to your ribs may help decrease pain.  Do not take the tramadol that you have at home while you are taking this make medication.  The oxycodone can cause drowsiness and increase your risk for falling.  Return to the emergency department if you develop any sudden shortness of breath or difficulty breathing.

## 2022-06-24 NOTE — ED Provider Notes (Signed)
[facility] Provider Note    None    (approximate)   History   Fall   HPI  Miranda Fleming is a 56 y.o. female   presents to the ED with complaint of left rib pain after she fell at home.  Patient states that she spilled coffee creamer and then slipped falling on it.  Patient denies any head injury or loss of consciousness.  Husband is present with her and states he was at home when this happened.  Patient has a history of palpitations, anxiety, DVT.      Physical Exam   Triage Vital Signs: ED Triage Vitals  Enc Vitals Group     BP 06/24/22 0746 (!) 103/49     Pulse Rate 06/24/22 0746 81     Resp 06/24/22 0746 16     Temp 06/24/22 0746 97.9 F (36.6 C)     Temp Source 06/24/22 0746 Oral     SpO2 06/24/22 0746 96 %     Weight 06/24/22 0710 110 lb (49.9 kg)     Height 06/24/22 0710 5\' 5"  (1.651 m)     Head Circumference --      Peak Flow --      Pain Score 06/24/22 0710 10     Pain Loc --      Pain Edu? --      Excl. in GC? --     Most recent vital signs: Vitals:   06/24/22 0746  BP: (!) 103/49  Pulse: 81  Resp: 16  Temp: 97.9 F (36.6 C)  SpO2: 96%     General: Awake, no distress.  CV:  Good peripheral perfusion.  Regular rate and rhythm. Resp:  Normal effort.  Clear bilaterally.  Left lateral ribs moderately tender to light palpation.  No skin discoloration or abrasions are noted. Abd:  No distention.  Other:  Normal gait.   ED Results / Procedures / Treatments   Labs (all labs ordered are listed, but only abnormal results are displayed) Labs Reviewed - No data to display   RADIOLOGY  Left rib x-ray radiology reports acute rib fracture seventh and eighth.  No pneumothorax or effusion.   PROCEDURES:  Critical Care performed:   Procedures   MEDICATIONS ORDERED IN ED: Medications  oxyCODONE (Oxy IR/ROXICODONE) immediate release tablet 5 mg (5 mg Oral Given 06/24/22 0802)     IMPRESSION / MDM / ASSESSMENT  AND PLAN / ED COURSE  I reviewed the triage vital signs and the nursing notes.   Differential diagnosis includes, but is not limited to, due to left rib, fractures, muscle skeletal pain secondary to fall.  56 year old female presents to the ED with complaint of left lateral rib pain after a fall that occurred this morning when she slipped and some creamer on the floor.  Radiology report confirms that patient has 2 acute fractures left seventh and eighth ribs.  Patient was made aware.  Oxycodone IR 5 mg was given to the patient while in the ED as she had taken Tylenol 500 mg prior to arrival.  In looking through patient's records it was noted that she takes Adderall as needed.  She is aware that she cannot take the tramadol while taking the oxycodone.  She also was made aware that the oxycodone is stronger and could cause drowsiness and increase her risk for falling.  Patient will follow-up with her PCP if any continued problems.  She is to return to the emergency department should  she develop any shortness of breath or difficulty breathing.      Patient's presentation is most consistent with acute complicated illness / injury requiring diagnostic workup.  FINAL CLINICAL IMPRESSION(S) / ED DIAGNOSES   Final diagnoses:  Closed fracture of two ribs of left side, initial encounter  Fall in home, initial encounter     Rx / DC Orders   ED Discharge Orders          Ordered    oxyCODONE (OXY IR/ROXICODONE) 5 MG immediate release tablet  Every 6 hours PRN        06/24/22 0820             Note:  This document was prepared using Dragon voice recognition software and may include unintentional dictation errors.   Tommi Rumps, PA-C 06/24/22 0915    Sharman Cheek, MD 06/26/22 [phone]

## 2022-06-24 NOTE — ED Notes (Signed)
See triage note  Presents s/p fall  states she slipped hitting left side of chest/rib area on floor   Denies hitting her head

## 2022-07-29 DIAGNOSIS — G629 Polyneuropathy, unspecified: Secondary | ICD-10-CM | POA: Insufficient documentation

## 2022-07-29 DIAGNOSIS — G5601 Carpal tunnel syndrome, right upper limb: Secondary | ICD-10-CM | POA: Insufficient documentation

## 2022-07-29 DIAGNOSIS — G5610 Other lesions of median nerve, unspecified upper limb: Secondary | ICD-10-CM | POA: Insufficient documentation

## 2022-07-29 DIAGNOSIS — G5631 Lesion of radial nerve, right upper limb: Secondary | ICD-10-CM | POA: Insufficient documentation

## 2022-07-29 DIAGNOSIS — G5620 Lesion of ulnar nerve, unspecified upper limb: Secondary | ICD-10-CM | POA: Insufficient documentation

## 2022-09-03 ENCOUNTER — Other Ambulatory Visit: Payer: Self-pay | Admitting: Obstetrics and Gynecology

## 2022-09-03 DIAGNOSIS — N644 Mastodynia: Secondary | ICD-10-CM

## 2022-09-10 ENCOUNTER — Ambulatory Visit
Admission: RE | Admit: 2022-09-10 | Discharge: 2022-09-10 | Disposition: A | Discharge status: Home / Self Care | Payer: BC Managed Care – PPO | Source: Ambulatory Visit | Attending: Obstetrics and Gynecology | Admitting: Obstetrics and Gynecology

## 2022-09-10 DIAGNOSIS — N644 Mastodynia: Secondary | ICD-10-CM

## 2023-08-03 ENCOUNTER — Emergency Department
Admission: EM | Admit: 2023-08-03 | Discharge: 2023-08-03 | Disposition: A | Discharge status: Home / Self Care | Payer: Commercial Managed Care - PPO | Attending: Emergency Medicine | Admitting: Emergency Medicine

## 2023-08-03 ENCOUNTER — Emergency Department: Payer: Commercial Managed Care - PPO

## 2023-08-03 ENCOUNTER — Other Ambulatory Visit: Payer: Self-pay

## 2023-08-03 DIAGNOSIS — S22040A Wedge compression fracture of fourth thoracic vertebra, initial encounter for closed fracture: Secondary | ICD-10-CM | POA: Insufficient documentation

## 2023-08-03 DIAGNOSIS — S22020A Wedge compression fracture of second thoracic vertebra, initial encounter for closed fracture: Secondary | ICD-10-CM | POA: Diagnosis not present

## 2023-08-03 DIAGNOSIS — S3992XA Unspecified injury of lower back, initial encounter: Secondary | ICD-10-CM | POA: Diagnosis present

## 2023-08-03 DIAGNOSIS — M542 Cervicalgia: Secondary | ICD-10-CM | POA: Insufficient documentation

## 2023-08-03 DIAGNOSIS — S22010A Wedge compression fracture of first thoracic vertebra, initial encounter for closed fracture: Secondary | ICD-10-CM | POA: Insufficient documentation

## 2023-08-03 DIAGNOSIS — S22030A Wedge compression fracture of third thoracic vertebra, initial encounter for closed fracture: Secondary | ICD-10-CM | POA: Diagnosis not present

## 2023-08-03 DIAGNOSIS — W06XXXA Fall from bed, initial encounter: Secondary | ICD-10-CM | POA: Diagnosis not present

## 2023-08-03 DIAGNOSIS — S22000A Wedge compression fracture of unspecified thoracic vertebra, initial encounter for closed fracture: Secondary | ICD-10-CM

## 2023-08-03 MED ORDER — HYDROMORPHONE HCL 1 MG/ML IJ SOLN
0.5000 mg | Freq: Once | INTRAMUSCULAR | Status: AC
  Administered 2023-08-03: 0.5 mg via INTRAVENOUS
  Filled 2023-08-03: qty 0.5

## 2023-08-03 MED ORDER — OXYCODONE-ACETAMINOPHEN 5-325 MG PO TABS
1.0000 | ORAL_TABLET | Freq: Once | ORAL | Status: AC
  Administered 2023-08-03: 1 via ORAL
  Filled 2023-08-03: qty 1

## 2023-08-03 MED ORDER — LIDOCAINE 5 % EX PTCH
1.0000 | MEDICATED_PATCH | CUTANEOUS | Status: DC
  Administered 2023-08-03: 1 via TRANSDERMAL
  Filled 2023-08-03: qty 1

## 2023-08-03 MED ORDER — IBUPROFEN 600 MG PO TABS
600.0000 mg | ORAL_TABLET | Freq: Once | ORAL | Status: AC
  Administered 2023-08-03: 600 mg via ORAL
  Filled 2023-08-03: qty 1

## 2023-08-03 MED ORDER — OXYCODONE-ACETAMINOPHEN 5-325 MG PO TABS: 1.0000 | ORAL_TABLET | Freq: Four times a day (QID) | ORAL | 0 refills | Status: AC | PRN

## 2023-08-03 NOTE — ED Notes (Signed)
See triage note  Presents s/p fall  States she fell out of bed last pm  Having some pain to neck and head  Denies any LOC

## 2023-08-03 NOTE — ED Triage Notes (Signed)
Pt to ED For fall out of bed last night. C/o neck pain. Denies blood thinner use

## 2023-08-03 NOTE — ED Notes (Signed)
TLSO brace has been delivered and put on the patient.

## 2023-08-03 NOTE — ED Provider Notes (Signed)
[facility] Provider Note    Event Date/Time   First MD Initiated Contact with Patient 08/03/23 1128     (approximate)   History   Fall   HPI  Miranda Fleming is a 57 year old female presenting to the emergency department for evaluation after a fall.  Last night, patient fell out of bed onto her shoulder and neck.  Did not act her head.  No loss of consciousness.  Not on anticoagulation.  Since that time, has had pain in her neck and upper back.  Denies chest or abdominal pain.  No focal numbness, tingling, weakness.      Physical Exam   Triage Vital Signs: ED Triage Vitals  Encounter Vitals Group     BP 08/03/23 1117 104/62     Systolic BP Percentile --      Diastolic BP Percentile --      Pulse Rate 08/03/23 1117 87     Resp 08/03/23 1117 16     Temp 08/03/23 1117 97.9 F (36.6 C)     Temp src --      SpO2 08/03/23 1117 99 %     Weight 08/03/23 1115 112 lb (50.8 kg)     Height 08/03/23 1115 5\' 5"  (1.651 m)     Head Circumference --      Peak Flow --      Pain Score 08/03/23 1115 5     Pain Loc --      Pain Education --      Exclude from Growth Chart --     Most recent vital signs: Vitals:   08/03/23 1117 08/03/23 1551  BP: 104/62 135/76  Pulse: 87 69  Resp: 16 16  Temp: 97.9 F (36.6 C)   SpO2: 99% 100%   Nursing notes and vital signs reviewed.  General: Adult female, sitting in chair upright, appears somewhat uncomfortable Head: Atraumatic Neck: Midline spinal tenderness present, hesitant to range due to pain Chest: Symmetric chest rise, no tenderness to palpation.  Cardiac: Regular rhythm and rate.  Respiratory: Lungs clear to auscultation Abdomen: Soft, nondistended. No tenderness to palpation.  Pelvis: Stable in AP and lateral compression. No tenderness to palpation. MSK: No deformity to bilateral upper and lower extremity. Full range of motion to bilateral upper and lower extremity. Back: Tenderness to palpation  through the cervical region into the upper to mid thoracic region.  No lumbar tenderness. Neuro: Alert, oriented. GCS 15.Normal sensation to light touch in bilateral upper and lower extremity. Skin: No evidence of burns or lacerations.  ED Results / Procedures / Treatments   Labs (all labs ordered are listed, but only abnormal results are displayed) Labs Reviewed - No data to display   EKG EKG independently reviewed interpreted by myself (ER attending) demonstrates:    RADIOLOGY Imaging independently reviewed and interpreted by myself demonstrates:  CT C-spine without acute fracture X-Sanaya Gwilliam of the thoracic spine notable for compression deformity at T2 with possible compression deformity at T3 CT thoracic spine demonstrates compression fractures of T1-T4 with less than 20% deformity  PROCEDURES:  Critical Care performed: No  Procedures   MEDICATIONS ORDERED IN ED: Medications  lidocaine (LIDODERM) 5 % 1 patch (1 patch Transdermal Patch Applied 08/03/23 1201)  oxyCODONE-acetaminophen (PERCOCET/ROXICET) 5-325 MG per tablet 1 tablet (1 tablet Oral Given 08/03/23 1201)  ibuprofen (ADVIL) tablet 600 mg (600 mg Oral Given 08/03/23 1201)  HYDROmorphone (DILAUDID) injection 0.5 mg (0.5 mg Intravenous Given 08/03/23 1424)  HYDROmorphone (DILAUDID) injection  0.5 mg (0.5 mg Intravenous Given 08/03/23 1552)     IMPRESSION / MDM / ASSESSMENT AND PLAN / ED COURSE  I reviewed the triage vital signs and the nursing notes.  Differential diagnosis includes, but is not limited to, fracture, dislocation, soft tissue injury, musculoskeletal strain, no evidence of thoracoabdominal trauma, no head trauma, no indication for head imaging by [place] head CT criteria  Patient's presentation is most consistent with acute presentation with potential threat to life or bodily function.  57 year old female presenting after a fall with neck and upper back pain.  Vital stable on presentation.  CT C-spine  reassuring, but thoracic x-Kessler Kopinski concerning for compression fracture possibly at multiple levels.  Given tenderness in this location, thoracic CT was ordered which demonstrated for compression fractures.  Patient continued to have some pain after oral multimodal pain control, so she was given IV pain medicine.  She did have improvement in her pain with this.  Given multiple compression fractures, case was reviewed with neurosurgery.  Signed out to oncoming provider pending neurosurgery recommendations and disposition.      FINAL CLINICAL IMPRESSION(S) / ED DIAGNOSES   Final diagnoses:  Compression fracture of thoracic vertebra, unspecified thoracic vertebral level, initial encounter (HCC)  Neck pain     Rx / DC Orders   ED Discharge Orders          Ordered    oxyCODONE-acetaminophen (PERCOCET/ROXICET) 5-325 MG tablet  Every 6 hours PRN        08/03/23 1608             Note:  This document was prepared using Dragon voice recognition software and may include unintentional dictation errors.   Trinna Post, MD 08/03/23 [phone]

## 2023-08-03 NOTE — Discharge Instructions (Addendum)
You were seen in the emergency room today for evaluation after fall.  Your imaging showed that you do have compression fractures in your thoracic spine.  You can take 650 mg of Tylenol 4 times a day and 600 mg of ibuprofen 4 times a day to help with your pain.  If you have breakthrough pain, I have sent a prescription for narcotic pain medicine to your pharmacy.  Do not drive or operate machinery when taking this.  Have included information for follow-up with a spine specialist for further evaluation.  Return to the ER for new or worsening symptoms.

## 2023-08-03 NOTE — ED Notes (Signed)
Pt is ambulatory to and from restroom with independent steady gait.

## 2023-08-04 ENCOUNTER — Telehealth: Payer: Self-pay | Admitting: Neurosurgery

## 2023-08-04 NOTE — Telephone Encounter (Signed)
Letter emailed to patient per her request.

## 2023-08-04 NOTE — Telephone Encounter (Signed)
Work note completed, please let patient know. Thanks!

## 2023-08-04 NOTE — Telephone Encounter (Signed)
ED on 08/03/2023, follow up needed in clinic with upright xrays 2-4 weeks per Dr.Smith.  I spoke to the patient and she requested to be seen in 2 weeks. Appt on 08/17/2023. The ER gave her a note to be out of work through 08/05/2023. 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 She needs a note to keep her out of work at least until her appt with Dr.Smtih.

## 2023-08-10 ENCOUNTER — Ambulatory Visit: Payer: BLUE CROSS/BLUE SHIELD | Admitting: Neurosurgery

## 2023-08-11 NOTE — Telephone Encounter (Signed)
Please see conversation via secure chat between me and Dr. Katrinka Blazing below:  Dr. Katrinka Blazing, This patient was seen in the ER and you did not round on her and she has never been seen in our office. She has requested a note to be off work until her appointment with on Oct 7. Did you give permission to send a work excuse for patient to be off work?   8:25 AM BS Lovenia Kim, MD yeah I think that's fine, she had a few compression fractures that appear new   Documentation needed in chart for letter sent.

## 2023-08-12 NOTE — Progress Notes (Signed)
Referring Physician:  No referring provider defined for this encounter.  Primary Physician:  Pcp, No  History of Present Illness: 08/17/2023 [name] is here today with a chief complaint of a fall with thoracic compression fractures seen in the emergency department and referred to clinic.  She does not have any neurologic symptoms including no weakness numbness or tingling.  No radiating pain down her legs or around her chest.  No trouble with bowel or bladder issues.  No difficulty walking.  She has been wearing a TLSO brace mostly for comfort while upright.  She states that it does not feel like it gives her a significant amount of support.  She has been using ice, certain positions cause it to get worse.  She has been dealing with for approximately 2 weeks.  She has not yet physical therapy.  She has been trying anti-inflammatories lidocaine patches, oxycodone, ibuprofen, and Dilaudid.  I had any surgical intervention or steroid injections. presenting after a fall with neck and upper back pain   I have utilized the care everywhere function in epic to review the outside records available from external health systems.  Review of Systems:  A 10 point review of systems is negative, except for the pertinent positives and negatives detailed in the HPI.  Past Medical History: Past Medical History:  Diagnosis Date   DVT, lower extremity, distal, acute (HCC)    paired right peroneal veins; 2 weeks after back surgery   Palpitations 11/23/2015   SUI (stress urinary incontinence, female)    Wears glasses     Past Surgical History: Past Surgical History:  Procedure Laterality Date   ABDOMINAL HYSTERECTOMY  1995   W/ RIGHT SALPINGOOPHORECTOMY   EXPLORATORY LAPAROTOMY W/  LEFT SALPINGOOPHORECTOMY  01-25-2001   LAPAROSCOPY'S AND LAPAROTOMY'S FOR PERSISTANT OVARIAN CYST  X 5  1987;  1989;  1990;  1993;  1994   PUBOVAGINAL SLING N/A 10/31/2013   Procedure: LYNX PUBO-VAGINAL SLING;   Surgeon: Kathi Ludwig, MD;  Location: [facility];  Service: Urology;  Laterality: N/A;    Allergies: Allergies as of 08/17/2023 - Review Complete 08/17/2023  Allergen Reaction Noted   Amoxicillin Rash 03/20/2011   Latex Rash 10/26/2013    Medications:  Current Outpatient Medications:    acetaminophen-codeine (TYLENOL #2) 300-15 MG tablet, Take 1-2 tablets by mouth every 4 (four) hours as needed., Disp: , Rfl:    ALPRAZolam (XANAX) 1 MG tablet, Take 1 mg by mouth every 8 (eight) hours as needed., Disp: , Rfl:    calcium carbonate (OS-CAL - DOSED IN MG OF ELEMENTAL CALCIUM) 1250 (500 Ca) MG tablet, Take 1 tablet by mouth daily with breakfast., Disp: , Rfl:    cyanocobalamin (VITAMIN B12) 500 MCG tablet, Take 500 mcg by mouth daily., Disp: , Rfl:    estradiol (ESTRACE) 0.5 MG tablet, Take 0.5 mg by mouth daily., Disp: , Rfl:    fluconazole (DIFLUCAN) 200 MG tablet, Take 200 mg by mouth daily., Disp: , Rfl:    fluocinonide (LIDEX) 0.05 % external solution, Apply 1 application topically daily., Disp: , Rfl:    gabapentin (NEURONTIN) 300 MG capsule, Take 1 capsule by mouth at bedtime., Disp: , Rfl:    ibuprofen (ADVIL) 600 MG tablet, Take 1 tablet (600 mg total) by mouth every 8 (eight) hours as needed., Disp: 30 tablet, Rfl: 0   metoCLOPramide (REGLAN) 5 MG tablet, Take 5 mg by mouth every 6 (six) hours as needed for nausea., Disp: ,  Rfl:    Multiple Minerals-Vitamins (BONE ESSENTIALS) CAPS, Take by mouth., Disp: , Rfl:    Multiple Vitamin (MULTIVITAMIN) tablet, Take 1 tablet by mouth daily., Disp: , Rfl:    Omega-3 Fatty Acids (FISH OIL) 1000 MG CAPS, Take 1 capsule by mouth daily., Disp: , Rfl:    traMADol (ULTRAM) 50 MG tablet, Take 50 mg by mouth every 6 (six) hours as needed., Disp: , Rfl:    Vitamin D, Ergocalciferol, (DRISDOL) 1.25 MG (50000 UT) CAPS capsule, TAKE ONE CAPSULE BY MOUTH ONE TIME PER WEEK, Disp: 12 capsule, Rfl: 3   dicyclomine (BENTYL) 10 MG  capsule, Take 1 capsule (10 mg total) by mouth 3 (three) times daily as needed for up to 14 days for spasms (abdominal cramping)., Disp: 12 capsule, Rfl: 0  Social History: Social History   Tobacco Use   Smoking status: Every Day    Current packs/day: 0.00    Average packs/day: 1.5 packs/day for 20.0 years (30.0 ttl pk-yrs)    Types: Cigarettes    Start date: 10/26/1980    Last attempt to quit: 10/26/2000    Years since quitting: 22.8   Smokeless tobacco: Never  Vaping Use   Vaping status: Never Used  Substance Use Topics   Alcohol use: Yes    Alcohol/week: 4.0 standard drinks of alcohol    Types: 4 Standard drinks or equivalent per week    Family Medical History: Family History  Problem Relation Age of Onset   Ovarian cancer Mother    Osteoporosis Mother    Breast cancer Mother        Age 29   Diabetes Maternal Aunt    Ovarian cancer Maternal Grandmother    Colon cancer Paternal Grandfather    Healthy Father    Osteopenia Sister    Healthy Brother     Physical Examination: Vitals:   08/17/23 0908  BP: 110/72    General: Patient is in no apparent distress. Attention to examination is appropriate.  Neck:   Pain to palpation at the cervical thoracic junction.  Respiratory: Patient is breathing without any difficulty.   NEUROLOGICAL:     Awake, alert, oriented to person, place, and time.  Speech is clear and fluent.   Cranial Nerves: Pupils equal round and reactive to light.  Facial tone is symmetric.  Facial sensation is symmetric. Shoulder shrug is symmetric. Tongue protrusion is midline.    Strength: Significant expected pain limitation in the upper and lower extremities well proximally and distally.  This is excess macerated with bracing of her shoulders.  Distally she does not appear to have any gross deficits.\  Reflexes are 2 + and symmetric at the biceps, triceps, brachioradialis, patella and achilles.   Hoffman's is absent. Clonus is  absent  Bilateral upper and lower extremity sensation is intact to light touch .     Gait is normal.    Imaging: Narrative & Impression  CLINICAL DATA:  Fall from bed.  Abnormal x-ray   EXAM: CT THORACIC SPINE WITHOUT CONTRAST   TECHNIQUE: Multidetector CT images of the thoracic were obtained using the standard protocol without intravenous contrast.   RADIATION DOSE REDUCTION: This exam was performed according to the departmental dose-optimization program which includes automated exposure control, adjustment of the mA and/or kV according to patient size and/or use of iterative reconstruction technique.   COMPARISON:  Same-day x-ray   FINDINGS: Alignment: Normal.   Vertebrae: Acute mild superior endplate compression fractures of the T1, T2, T3, and T4  vertebral bodies (series 7, images 33-36). Less than 20% vertebral body height loss at each level. There is no bony retropulsion. No fracture extension into the posterior elements. The remaining thoracic vertebral body heights are maintained. No lytic or sclerotic bone lesion.   Paraspinal and other soft tissues: Dependent bibasilar atelectasis. No acute abnormality.   Disc levels: Intervertebral disc heights are preserved. Minimal degenerative facet arthropathy, most notably within the upper thoracic spine.   IMPRESSION: Acute mild superior endplate compression fractures of the T1, T2, T3, and T4 vertebral bodies. Less than 20% vertebral body height loss at each level. No bony retropulsion.     Electronically Signed   By: Duanne Guess D.O.   On: 08/03/2023 16:11      I have personally reviewed the images and agree with the above interpretation.  Medical Decision Making/Assessment and Plan: Ms. Lartigue is a pleasant 57 y.o. female with with upper thoracic compression fractures.  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  Given their confirmation they are likely stable at this point.  She has been  using a TLSO brace, however this is likely too low for her in regards to a supporting mechanism.  This would mostly be for pain control at this point anyways.  We discussed ordering her a CTO however she does not feel that she would like to wear 1.  We did discuss that this would give her better support, however she is unlikely to be unstable.  We like to get a repeat of her x-rays as her thoracolumbar x-rays did not demonstrate the upper portion of her thoracic spine.  We have ordered dedicated thoracic films.  Would like to review these as well.  Will follow-up in 1 month.  At this point given her pain and recent fractures she will likely need to be on limited work orders.  We have asked that she obtain her work paperwork and we can review it with her.  She may need a capacity evaluation with physical therapy.    Thank you for involving me in the care of this patient.    Lovenia Kim MD/MSCR Neurosurgery

## 2023-08-14 ENCOUNTER — Ambulatory Visit
Admission: RE | Admit: 2023-08-14 | Discharge: 2023-08-14 | Disposition: A | Discharge status: Home / Self Care | Payer: Commercial Managed Care - PPO | Attending: Neurosurgery | Admitting: Neurosurgery

## 2023-08-14 ENCOUNTER — Other Ambulatory Visit: Payer: Self-pay

## 2023-08-14 ENCOUNTER — Ambulatory Visit
Admission: RE | Admit: 2023-08-14 | Discharge: 2023-08-14 | Disposition: A | Discharge status: Home / Self Care | Payer: Commercial Managed Care - PPO | Source: Ambulatory Visit | Attending: Neurosurgery | Admitting: Neurosurgery

## 2023-08-14 DIAGNOSIS — S22000A Wedge compression fracture of unspecified thoracic vertebra, initial encounter for closed fracture: Secondary | ICD-10-CM

## 2023-08-17 ENCOUNTER — Ambulatory Visit
Admission: RE | Admit: 2023-08-17 | Discharge: 2023-08-17 | Disposition: A | Discharge status: Home / Self Care | Payer: Commercial Managed Care - PPO | Attending: Neurosurgery | Admitting: Neurosurgery

## 2023-08-17 ENCOUNTER — Telehealth: Payer: Self-pay | Admitting: Neurosurgery

## 2023-08-17 ENCOUNTER — Encounter: Payer: Self-pay | Admitting: Neurosurgery

## 2023-08-17 ENCOUNTER — Ambulatory Visit: Payer: Commercial Managed Care - PPO | Admitting: Neurosurgery

## 2023-08-17 ENCOUNTER — Ambulatory Visit
Admission: RE | Admit: 2023-08-17 | Discharge: 2023-08-17 | Disposition: A | Discharge status: Home / Self Care | Payer: Commercial Managed Care - PPO | Source: Ambulatory Visit | Attending: Neurosurgery | Admitting: Neurosurgery

## 2023-08-17 VITALS — BP 110/72 | Ht 65.0 in | Wt 117.0 lb

## 2023-08-17 DIAGNOSIS — S22000D Wedge compression fracture of unspecified thoracic vertebra, subsequent encounter for fracture with routine healing: Secondary | ICD-10-CM | POA: Insufficient documentation

## 2023-08-17 DIAGNOSIS — S22040A Wedge compression fracture of fourth thoracic vertebra, initial encounter for closed fracture: Secondary | ICD-10-CM | POA: Diagnosis not present

## 2023-08-17 DIAGNOSIS — M4854XS Collapsed vertebra, not elsewhere classified, thoracic region, sequela of fracture: Secondary | ICD-10-CM | POA: Insufficient documentation

## 2023-08-17 DIAGNOSIS — S22020D Wedge compression fracture of second thoracic vertebra, subsequent encounter for fracture with routine healing: Secondary | ICD-10-CM

## 2023-08-17 DIAGNOSIS — S22030A Wedge compression fracture of third thoracic vertebra, initial encounter for closed fracture: Secondary | ICD-10-CM

## 2023-08-17 DIAGNOSIS — S22000A Wedge compression fracture of unspecified thoracic vertebra, initial encounter for closed fracture: Secondary | ICD-10-CM | POA: Insufficient documentation

## 2023-08-17 DIAGNOSIS — S22020A Wedge compression fracture of second thoracic vertebra, initial encounter for closed fracture: Secondary | ICD-10-CM | POA: Diagnosis not present

## 2023-08-17 DIAGNOSIS — S22000S Wedge compression fracture of unspecified thoracic vertebra, sequela: Secondary | ICD-10-CM | POA: Insufficient documentation

## 2023-08-17 DIAGNOSIS — S22010A Wedge compression fracture of first thoracic vertebra, initial encounter for closed fracture: Secondary | ICD-10-CM

## 2023-08-17 DIAGNOSIS — W19XXXA Unspecified fall, initial encounter: Secondary | ICD-10-CM

## 2023-08-17 NOTE — Telephone Encounter (Signed)
She was seen today. Her job does not have forms to be filled out for leave. All they need is a doctor's note that she will be out of work for 2 months. Dr.Smith is aware she was going to call back.

## 2023-08-17 NOTE — Telephone Encounter (Signed)
Her next appointment is 09/21/2023. Do you want her to have a note out for 2 months or until her next OV?

## 2023-08-18 ENCOUNTER — Encounter: Payer: Self-pay | Admitting: Family Medicine

## 2023-08-18 NOTE — Telephone Encounter (Signed)
LMOM informed patient of message below and told her note is up front for pick up or she can stop by to pick up a copy.

## 2023-09-18 ENCOUNTER — Other Ambulatory Visit: Payer: Self-pay

## 2023-09-18 DIAGNOSIS — S22020D Wedge compression fracture of second thoracic vertebra, subsequent encounter for fracture with routine healing: Secondary | ICD-10-CM

## 2023-09-21 ENCOUNTER — Encounter: Payer: Self-pay | Admitting: Neurosurgery

## 2023-09-21 ENCOUNTER — Ambulatory Visit: Payer: Commercial Managed Care - PPO | Admitting: Neurosurgery

## 2023-09-21 VITALS — BP 120/72 | Ht 65.0 in | Wt 117.0 lb

## 2023-09-21 DIAGNOSIS — S22040D Wedge compression fracture of fourth thoracic vertebra, subsequent encounter for fracture with routine healing: Secondary | ICD-10-CM

## 2023-09-21 DIAGNOSIS — S22020D Wedge compression fracture of second thoracic vertebra, subsequent encounter for fracture with routine healing: Secondary | ICD-10-CM | POA: Diagnosis not present

## 2023-09-21 DIAGNOSIS — S22010D Wedge compression fracture of first thoracic vertebra, subsequent encounter for fracture with routine healing: Secondary | ICD-10-CM

## 2023-09-21 DIAGNOSIS — S22030D Wedge compression fracture of third thoracic vertebra, subsequent encounter for fracture with routine healing: Secondary | ICD-10-CM | POA: Diagnosis not present

## 2023-09-21 DIAGNOSIS — W19XXXD Unspecified fall, subsequent encounter: Secondary | ICD-10-CM

## 2023-09-21 NOTE — Progress Notes (Signed)
Referring Physician:  No referring provider defined for this encounter.  Primary Physician:  Pcp, No  History of Present Illness: 09/21/2023 Miranda Fleming is here today with a chief complaint of a fall with thoracic compression fractures seen in the emergency department and referred to clinic.  She was seen previously and opted for conservative care  We are seeing her again today in follow-up.  She continues to have significant back pain in her upper thoracic spine.  She states that this level is approximately a 5-6 throughout the day but does worsen when she is in bed.  She is not having any new neurologic symptoms.  No upper extremity or lower extremity symptoms.  No new numbness weakness or tingling.  She states that is mostly midline back pain that worsens with exertion.  I have utilized the care everywhere function in epic to review the outside records available from external health systems.  Review of Systems:  A 10 point review of systems is negative, except for the pertinent positives and negatives detailed in the HPI.  Past Medical History: Past Medical History:  Diagnosis Date   DVT, lower extremity, distal, acute (HCC)    paired right peroneal veins; 2 weeks after back surgery   Palpitations 11/23/2015   SUI (stress urinary incontinence, female)    Wears glasses     Past Surgical History: Past Surgical History:  Procedure Laterality Date   ABDOMINAL HYSTERECTOMY  1995   W/ RIGHT SALPINGOOPHORECTOMY   EXPLORATORY LAPAROTOMY W/  LEFT SALPINGOOPHORECTOMY  01-25-2001   LAPAROSCOPY'S AND LAPAROTOMY'S FOR PERSISTANT OVARIAN CYST  X 5  1987;  1989;  1990;  1993;  1994   PUBOVAGINAL SLING N/A 10/31/2013   Procedure: LYNX PUBO-VAGINAL SLING;  Surgeon: Kathi Ludwig, MD;  Location: [facility];  Service: Urology;  Laterality: N/A;    Allergies: Allergies as of 09/21/2023 - Review Complete 09/21/2023  Allergen Reaction Noted   Amoxicillin  Rash 03/20/2011   Latex Rash 10/26/2013    Medications:  Current Outpatient Medications:    ALPRAZolam (XANAX) 1 MG tablet, Take 1 mg by mouth every 8 (eight) hours as needed., Disp: , Rfl:    calcium carbonate (OS-CAL - DOSED IN MG OF ELEMENTAL CALCIUM) 1250 (500 Ca) MG tablet, Take 1 tablet by mouth daily with breakfast., Disp: , Rfl:    cyanocobalamin (VITAMIN B12) 500 MCG tablet, Take 500 mcg by mouth daily., Disp: , Rfl:    estradiol (ESTRACE) 0.5 MG tablet, Take 0.5 mg by mouth daily., Disp: , Rfl:    fluconazole (DIFLUCAN) 200 MG tablet, Take 200 mg by mouth daily., Disp: , Rfl:    ibuprofen (ADVIL) 600 MG tablet, Take 1 tablet (600 mg total) by mouth every 8 (eight) hours as needed., Disp: 30 tablet, Rfl: 0   Multiple Minerals-Vitamins (BONE ESSENTIALS) CAPS, Take by mouth., Disp: , Rfl:    Multiple Vitamin (MULTIVITAMIN) tablet, Take 1 tablet by mouth daily., Disp: , Rfl:    Omega-3 Fatty Acids (FISH OIL) 1000 MG CAPS, Take 1 capsule by mouth daily., Disp: , Rfl:    traMADol (ULTRAM) 50 MG tablet, Take 50 mg by mouth every 6 (six) hours as needed., Disp: , Rfl:    Vitamin D, Ergocalciferol, (DRISDOL) 1.25 MG (50000 UT) CAPS capsule, TAKE ONE CAPSULE BY MOUTH ONE TIME PER WEEK, Disp: 12 capsule, Rfl: 3  Social History: Social History   Tobacco Use   Smoking status: Every Day    Current packs/day:  0.00    Average packs/day: 1.5 packs/day for 20.0 years (30.0 ttl pk-yrs)    Types: Cigarettes    Start date: 10/26/1980    Last attempt to quit: 10/26/2000    Years since quitting: 22.9   Smokeless tobacco: Never  Vaping Use   Vaping status: Never Used  Substance Use Topics   Alcohol use: Yes    Alcohol/week: 4.0 standard drinks of alcohol    Types: 4 Standard drinks or equivalent per week    Family Medical History: Family History  Problem Relation Age of Onset   Ovarian cancer Mother    Osteoporosis Mother    Breast cancer Mother        Age 21   Diabetes Maternal Aunt     Ovarian cancer Maternal Grandmother    Colon cancer Paternal Grandfather    Healthy Father    Osteopenia Sister    Healthy Brother     Physical Examination: Vitals:   09/21/23 1430  BP: 120/72    General: Patient is in no apparent distress. Attention to examination is appropriate.  Neck:   Pain to palpation at the cervical thoracic junction.  Respiratory: Patient is breathing without any difficulty.   NEUROLOGICAL:     Awake, alert, oriented to person, place, and time.  Speech is clear and fluent.   Cranial Nerves: Pupils equal round and reactive to light.  Facial tone is symmetric.  Facial sensation is symmetric. Shoulder shrug is symmetric. Tongue protrusion is midline.    Strength: Patient appears to be improved as far as her upper extremity and lower extremity exertion goes, still does have midline pain to palpation and rapid movements.  Reflexes are 2 + and symmetric at the biceps, triceps, brachioradialis, patella and achilles.   Hoffman's is absent. Clonus is absent  Bilateral upper and lower extremity sensation is intact to light touch .     Gait is normal.    Imaging: Imaging reviewed and shows no progression of her thoracic spinal compression fracture, we will plan to follow-up on final reads.   I have personally reviewed the images and agree with the above interpretation.  Medical Decision Making/Assessment and Plan: [name] is a pleasant 57 y.o. female with with upper thoracic compression fractures.  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  Given their confirmation they are likely stable at this point.  She had a TLSO brace, we using a CTO brace given her T1-T4 superior endplate fractures.  She is not having any neurologic deficits.  Her x-rays appear stable.  At this point since she is now 6 to 8 weeks out without any significant improvement in her pain, we will plan to make a referral to our neurointerventional radiology team to discuss  possible kyphoplasty.  Referral has been made.  At this point her pain level continues to be too high to return to her very physical job in the short-term.  We like her to stay out of work until follows up with the interventional radiology team and possible kyphoplasty.  Thank you for involving me in the care of this patient.    Lovenia Kim MD/MSCR Neurosurgery

## 2023-09-22 ENCOUNTER — Other Ambulatory Visit: Payer: Self-pay | Admitting: Neurosurgery

## 2023-09-22 DIAGNOSIS — S22020D Wedge compression fracture of second thoracic vertebra, subsequent encounter for fracture with routine healing: Secondary | ICD-10-CM

## 2023-09-24 ENCOUNTER — Other Ambulatory Visit: Payer: Commercial Managed Care - PPO

## 2023-10-06 LAB — EXTERNAL GENERIC LAB PROCEDURE: COLOGUARD: NEGATIVE

## 2023-10-06 LAB — COLOGUARD: COLOGUARD: NEGATIVE

## 2023-10-13 ENCOUNTER — Telehealth (HOSPITAL_COMMUNITY): Payer: Self-pay

## 2023-10-13 NOTE — Telephone Encounter (Signed)
-----   Message from Baldemar Lenis sent at 09/28/2023 11:50 AM EST ----- Regarding: RE: T2 KP Thanks. ----- Message ----- From: Sharee Pimple Sent: 09/28/2023  11:49 AM EST To: Baldemar Lenis, MD; # Subject: RE: T2 KP                                      Thanks,   I sent Dr. Katrinka Blazing a secure chat back and let him know. ----- Message ----- From: Baldemar Lenis, MD Sent: 09/28/2023  11:32 AM EST To: Shirlyn Goltz; Sharee Pimple Subject: RE: T2 KP                                      Hi Ash,  Since it's been almost 2 months since she had the CT, we should get an MRI of the thoracic spine without contrast since she would only benefit from the procedure if there is still edema. It would also help to decide which level to target since she has multiple levels affected.  Thanks, Georgiann Hahn ----- Message ----- From: Sharee Pimple Sent: 09/28/2023   8:35 AM EST To: Baldemar Lenis, MD; # Subject: T2 KP                                          Kat,   Just received a message from Dr. Katrinka Blazing to get this pt scheduled for a T2 KP. Please review.   Thanks,  Fara Boros

## 2023-10-26 ENCOUNTER — Telehealth: Payer: Self-pay | Admitting: Neurosurgery

## 2023-10-26 NOTE — Telephone Encounter (Signed)
Patient has called to state that she needs a new letter for when she returns to work that includes her restrictions. Also states that she cannot pull, push, lift over 5 punds, or reach her arms up high. Please advise

## 2023-10-27 ENCOUNTER — Encounter: Payer: Self-pay | Admitting: Family Medicine

## 2023-10-27 NOTE — Telephone Encounter (Signed)
Patient notified letter has been updated.

## 2023-11-18 ENCOUNTER — Encounter (INDEPENDENT_AMBULATORY_CARE_PROVIDER_SITE_OTHER): Payer: Self-pay

## 2023-11-18 ENCOUNTER — Telehealth: Payer: Self-pay | Admitting: Family Medicine

## 2023-11-18 ENCOUNTER — Encounter: Payer: Self-pay | Admitting: Neurosurgery

## 2023-11-18 ENCOUNTER — Ambulatory Visit: Payer: Commercial Managed Care - PPO | Admitting: Neurosurgery

## 2023-11-18 VITALS — BP 112/72 | Ht 65.0 in | Wt 117.0 lb

## 2023-11-18 DIAGNOSIS — S22010D Wedge compression fracture of first thoracic vertebra, subsequent encounter for fracture with routine healing: Secondary | ICD-10-CM

## 2023-11-18 DIAGNOSIS — S22020D Wedge compression fracture of second thoracic vertebra, subsequent encounter for fracture with routine healing: Secondary | ICD-10-CM | POA: Diagnosis not present

## 2023-11-18 DIAGNOSIS — S22040D Wedge compression fracture of fourth thoracic vertebra, subsequent encounter for fracture with routine healing: Secondary | ICD-10-CM

## 2023-11-18 DIAGNOSIS — S22030D Wedge compression fracture of third thoracic vertebra, subsequent encounter for fracture with routine healing: Secondary | ICD-10-CM | POA: Diagnosis not present

## 2023-11-18 DIAGNOSIS — S22000A Wedge compression fracture of unspecified thoracic vertebra, initial encounter for closed fracture: Secondary | ICD-10-CM

## 2023-11-18 LAB — HM DEXA SCAN

## 2023-11-18 NOTE — Progress Notes (Signed)
 Referring Physician:  No referring provider defined for this encounter.  Primary Physician:  Pcp, No  History of Present Illness: 11/18/2023 [name] is here today with a chief complaint of a fall with thoracic compression fractures seen in the emergency department and referred to clinic.  She was seen previously and opted for conservative care  She continues to have significant pain in her periscapular region and lower neck and upper back.  She has returned to work on [employer] duty and does have some difficulty maintaining her shifts given the activities that she is required to do.  She has not had any new bowel or bladder symptoms.  She does feel weakness in her shoulders but mostly secondary to pain.  She has not yet had physical therapy.  She has not yet tried a cervical extension on her brace.  We discussed this last time and advised against a cervical only brace given the lever arm that it would create, but that she would likely not be able to drive with a cervical thoracic orthosis extension.  I have utilized the care everywhere function in epic to review the outside records available from external health systems.  Review of Systems:  A 10 point review of systems is negative, except for the pertinent positives and negatives detailed in the HPI.  Past Medical History: Past Medical History:  Diagnosis Date   DVT, lower extremity, distal, acute (HCC)    paired right peroneal veins; 2 weeks after back surgery   Palpitations 11/23/2015   SUI (stress urinary incontinence, female)    Wears glasses     Past Surgical History: Past Surgical History:  Procedure Laterality Date   ABDOMINAL HYSTERECTOMY  1995   W/ RIGHT SALPINGOOPHORECTOMY   EXPLORATORY LAPAROTOMY W/  LEFT SALPINGOOPHORECTOMY  01-25-2001   LAPAROSCOPY'S AND LAPAROTOMY'S FOR PERSISTANT OVARIAN CYST  X 5  1987;  1989;  1990;  1993;  1994   PUBOVAGINAL SLING N/A 10/31/2013   Procedure: LYNX PUBO-VAGINAL  SLING;  Surgeon: Arlena LILLETTE Gal, MD;  Location: [facility];  Service: Urology;  Laterality: N/A;    Allergies: Allergies as of 11/18/2023 - Review Complete 11/18/2023  Allergen Reaction Noted   Amoxicillin Rash 03/20/2011   Latex Rash 10/26/2013    Medications:  Current Outpatient Medications:    ALPRAZolam  (XANAX ) 1 MG tablet, Take 1 mg by mouth every 8 (eight) hours as needed., Disp: , Rfl:    calcium carbonate (OS-CAL - DOSED IN MG OF ELEMENTAL CALCIUM) 1250 (500 Ca) MG tablet, Take 1 tablet by mouth daily with breakfast., Disp: , Rfl:    cyanocobalamin  (VITAMIN B12) 500 MCG tablet, Take 500 mcg by mouth daily., Disp: , Rfl:    estradiol  (ESTRACE ) 0.5 MG tablet, Take 0.5 mg by mouth daily., Disp: , Rfl:    Multiple Minerals-Vitamins (BONE ESSENTIALS) CAPS, Take by mouth., Disp: , Rfl:    Multiple Vitamin (MULTIVITAMIN) tablet, Take 1 tablet by mouth daily., Disp: , Rfl:    Omega-3 Fatty Acids (FISH OIL) 1000 MG CAPS, Take 1 capsule by mouth daily., Disp: , Rfl:    traMADol (ULTRAM) 50 MG tablet, Take 50 mg by mouth every 6 (six) hours as needed., Disp: , Rfl:   Social History: Social History   Tobacco Use   Smoking status: Former    Current packs/day: 0.00    Average packs/day: 1.5 packs/day for 41.0 years (61.6 ttl pk-yrs)    Types: Cigarettes    Start date: 10/26/1980  Quit date: 2023    Years since quitting: 2.0   Smokeless tobacco: Never  Vaping Use   Vaping status: Never Used  Substance Use Topics   Alcohol use: Yes    Alcohol/week: 4.0 standard drinks of alcohol    Types: 4 Standard drinks or equivalent per week    Family Medical History: Family History  Problem Relation Age of Onset   Ovarian cancer Mother    Osteoporosis Mother    Breast cancer Mother        Age 58   Diabetes Maternal Aunt    Ovarian cancer Maternal Grandmother    Colon cancer Paternal Grandfather    Healthy Father    Osteopenia Sister    Healthy Brother      Physical Examination: Vitals:   11/18/23 1334  BP: 112/72    General: Patient is in no apparent distress. Attention to examination is appropriate.  Neck:   Pain to palpation at the cervical thoracic junction.  Respiratory: Patient is breathing without any difficulty.   NEUROLOGICAL:     Awake, alert, oriented to person, place, and time.  Speech is clear and fluent.   Cranial Nerves: Pupils equal round and reactive to light.  Facial tone is symmetric.  Facial sensation is symmetric. Shoulder shrug is symmetric. Tongue protrusion is midline.    Strength: Continues to have limited range of motion of the bilateral shoulders.  This is secondary to pain but continues to be present.  She is at least 4+ out of 5 in grip elbow extension and elbow flexion, however some of these do trigger her pain.  Reflexes are 2 + and symmetric at the biceps, triceps, brachioradialis, patella and achilles.   Hoffman's is absent. Clonus is absent  Bilateral upper and lower extremity sensation is intact to light touch .     Gait is normal.    Imaging: No updated imaging at this time.   I have personally reviewed the images and agree with the above interpretation.  Medical Decision Making/Assessment and Plan: [name] is a pleasant 58 y.o. female with with upper thoracic compression fractures.  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  Given their confirmation they are likely stable at this point.  She has been wearing a TLSO brace, but has not opted for a cervical extension.  We do feel that she would do better with a CTO brace given the upper thoracic location of her fractures.  She would wear this for comfort and support only unless we discover some increased instability on her x-rays.  X-rays of her thoracic and cervical spine have been ordered.  We did advise against a cervical only brace as this would create a lever arm above her fracture level.  She continues to have good upper and  lower extremity strength, however she does get limited by pain throughout the day.  Would like her to continue on light work.  Will refer her for physical therapy for reconditioning as well as refer for pain management see whether or not she may be a candidate for any injections.  Early in her care she did not want to have a kyphoplasty procedure.  Will continue to follow.   Thank you for involving me in the care of this patient.   I spent a total of 30 minutes reviewing her imaging, going over her previous recommendations, going over her recent history, in person evaluation, and coordination of her care.  Penne MICAEL Sharps MD/MSCR Neurosurgery

## 2023-11-18 NOTE — Patient Instructions (Signed)
 [facility] will contact you regarding an appointment for the brace. If you have not heard from them, please reach out to [place].  Their number is [phone] should you miss their call or have an issue with your brace.

## 2023-11-18 NOTE — Telephone Encounter (Signed)
 CTO order was faxed to [facility]. A new work note was given to the patient. She is scheduled for a 3 month follow up.

## 2023-11-18 NOTE — Progress Notes (Signed)
 Order has been sent to [facility] for CTO.

## 2023-11-20 ENCOUNTER — Telehealth: Payer: Self-pay | Admitting: Neurosurgery

## 2023-11-20 NOTE — Telephone Encounter (Signed)
 Patient is calling to let our office know that she has not heard from the facility in which she is supposed to get her brace.

## 2023-11-20 NOTE — Telephone Encounter (Signed)
 Spoke to patient and informed her I reached out to [facility] and they will be closed until Tuesday. She will call Tuesday to get set up for the brace.

## 2023-11-24 NOTE — Telephone Encounter (Signed)
 Patient called the office stating she spoke with [facility] today and they never received our order. Please advise

## 2023-11-24 NOTE — Telephone Encounter (Signed)
 Re-faxed order to [facility]

## 2023-11-25 ENCOUNTER — Telehealth: Payer: Self-pay | Admitting: Neurosurgery

## 2023-11-25 NOTE — Telephone Encounter (Signed)
 Sam from [facility] has called. Did an evaluation on the patient yesterday and he is wondering if there are any restrictions for physical therapy regarding the patient's compression fracture, please advise.   [phone] // okay to leave a voicemail

## 2023-11-25 NOTE — Telephone Encounter (Signed)
 PT initial eval note has been scanned into media.

## 2023-11-25 NOTE — Telephone Encounter (Signed)
 Called Sam from [facility]. Relayed Dr. Shirlie Dove message about restrictions, Sam indicated his understanding.

## 2023-12-06 NOTE — Progress Notes (Unsigned)
Patient: Miranda Fleming  Service Category: E/M  Provider: Oswaldo Done, MD  DOB: 26-May-1966  DOS: 12/07/2023  Referring Provider: Lovenia Kim, MD  MRN: [id number]  Setting: Ambulatory outpatient  PCP: Pcp, No  Type: New Patient  Specialty: Interventional Pain Management    Location: Office  Delivery: Face-to-face     Primary Reason(s) for Visit: Encounter for initial evaluation of one or more chronic problems (new to examiner) potentially causing chronic pain, and posing a threat to normal musculoskeletal function. (Level of risk: High) CC: No chief complaint on file.  HPI  Miranda Fleming is a 58 y.o. year old, female patient, who comes for the first time to our practice referred by Lovenia Kim, MD for our initial evaluation of her chronic pain. She has Ovarian cyst; Anxiety; Stress incontinence in female; DVT of lower limb, acute (HCC); Palpitations; and Compression fracture of thoracic vertebra with routine healing on their problem list. Today she comes in for evaluation of her No chief complaint on file.  Pain Assessment: Location:     Radiating:   Onset:   Duration:   Quality:   Severity:  /10 (subjective, self-reported pain score)  Effect on ADL:   Timing:   Modifying factors:   BP:    HR:    Onset and Duration: {Hx; Onset and Duration:210120511} Cause of pain: {Hx; Cause:210120521} Severity: {Pain Severity:210120502} Timing: {Symptoms; Timing:210120501} Aggravating Factors: {Causes; Aggravating pain factors:210120507} Alleviating Factors: {Causes; Alleviating Factors:210120500} Associated Problems: {Hx; Associated problems:210120515} Quality of Pain: {Hx; Symptom quality or Descriptor:210120531} Previous Examinations or Tests: {Hx; Previous examinations or test:210120529} Previous Treatments: {Hx; Previous Treatment:210120503}  Miranda Fleming is being evaluated for possible interventional pain management therapies for the treatment of her chronic pain.   Discussed the use of AI scribe software for clinical note transcription with the patient, who gave verbal consent to proceed.  History of Present Illness           *** Miranda Fleming has been informed that this initial visit was an evaluation only.  On the follow up appointment I will go over the results, including ordered tests and available interventional therapies. At that time she will have the opportunity to decide whether to proceed with offered therapies or not. In the event that Miranda Fleming prefers avoiding interventional options, this will conclude our involvement in the case.  Medication management recommendations may be provided upon request.  Patient informed that diagnostic tests may be ordered to assist in identifying underlying causes, narrow the list of differential diagnoses and aid in determining candidacy for (or contraindications to) planned therapeutic interventions.  Historic Controlled Substance Pharmacotherapy Review  PMP and historical list of controlled substances: ***  Most recently prescribed opioid analgesics:   *** MME/day: *** mg/day  Historical Monitoring: The patient  has no history on file for drug use. List of prior UDS Testing: Lab Results  Component Value Date   MDMA NONE DETECTED 05/24/2021   COCAINSCRNUR NONE DETECTED 05/24/2021   PCPSCRNUR NONE DETECTED 05/24/2021   THCU POSITIVE (A) 05/24/2021   ETH <10 05/24/2021   Historical Background Evaluation: [place] PMP: PDMP reviewed during this encounter. Review of the past 54-months conducted.             PMP NARX Score Report:  Narcotic: *** Sedative: *** Stimulant: *** [place] Department of public safety, offender search: Engineer, mining Information) Non-contributory Risk Assessment Profile: Aberrant behavior: None observed or detected today Risk factors for fatal opioid overdose: None identified today PMP NARX  Overdose Risk Score: *** Fatal overdose hazard ratio (HR): Calculation deferred Non-fatal overdose  hazard ratio (HR): Calculation deferred Risk of opioid abuse or dependence: 0.7-3.0% with doses <= 36 MME/day and 6.1-26% with doses >= 120 MME/day. Substance use disorder (SUD) risk level: See below Personal History of Substance Abuse (SUD-Substance use disorder):  Alcohol:    Illegal Drugs:    Rx Drugs:    ORT Risk Level calculation:    ORT Scoring interpretation table:  Score <3 = Low Risk for SUD  Score between 4-7 = Moderate Risk for SUD  Score >8 = High Risk for Opioid Abuse   PHQ-2 Depression Scale:  Total score:    PHQ-2 Scoring interpretation table: (Score and probability of major depressive disorder)  Score 0 = No depression  Score 1 = 15.4% Probability  Score 2 = 21.1% Probability  Score 3 = 38.4% Probability  Score 4 = 45.5% Probability  Score 5 = 56.4% Probability  Score 6 = 78.6% Probability   PHQ-9 Depression Scale:  Total score:    PHQ-9 Scoring interpretation table:  Score 0-4 = No depression  Score 5-9 = Mild depression  Score 10-14 = Moderate depression  Score 15-19 = Moderately severe depression  Score 20-27 = Severe depression (2.4 times higher risk of SUD and 2.89 times higher risk of overuse)   Pharmacologic Plan: As per protocol, I have not taken over any controlled substance management, pending the results of ordered tests and/or consults.            Initial impression: Pending review of available data and ordered tests.  Meds   Current Outpatient Medications:    ALPRAZolam (XANAX) 1 MG tablet, Take 1 mg by mouth every 8 (eight) hours as needed., Disp: , Rfl:    calcium carbonate (OS-CAL - DOSED IN MG OF ELEMENTAL CALCIUM) 1250 (500 Ca) MG tablet, Take 1 tablet by mouth daily with breakfast., Disp: , Rfl:    cyanocobalamin (VITAMIN B12) 500 MCG tablet, Take 500 mcg by mouth daily., Disp: , Rfl:    estradiol (ESTRACE) 0.5 MG tablet, Take 0.5 mg by mouth daily., Disp: , Rfl:    Multiple Minerals-Vitamins (BONE ESSENTIALS) CAPS, Take by mouth., Disp:  , Rfl:    Multiple Vitamin (MULTIVITAMIN) tablet, Take 1 tablet by mouth daily., Disp: , Rfl:    Omega-3 Fatty Acids (FISH OIL) 1000 MG CAPS, Take 1 capsule by mouth daily., Disp: , Rfl:    traMADol (ULTRAM) 50 MG tablet, Take 50 mg by mouth every 6 (six) hours as needed., Disp: , Rfl:   Imaging Review  Cervical Imaging: Cervical MR wo contrast: No results found for this or any previous visit.  Cervical MR wo contrast: No results found for this or any previous visit.  Cervical MR w/wo contrast: No results found for this or any previous visit.  Cervical MR w contrast: No results found for this or any previous visit.  Cervical CT wo contrast: Results for orders placed during the hospital encounter of 08/03/23  CT Cervical Spine Wo Contrast  Narrative CLINICAL DATA:  Fall out of bed last night.  Neck pain.  EXAM: CT CERVICAL SPINE WITHOUT CONTRAST  TECHNIQUE: Multidetector CT imaging of the cervical spine was performed without intravenous contrast. Multiplanar CT image reconstructions were also generated.  RADIATION DOSE REDUCTION: This exam was performed according to the departmental dose-optimization program which includes automated exposure control, adjustment of the mA and/or kV according to patient size and/or use of iterative reconstruction technique.  COMPARISON:  CT of the cervical spine 12/26/2017  FINDINGS: Alignment: No significant listhesis is present. Cervical lordosis is stable.  Skull base and vertebrae: The craniocervical junction is within normal limits. Vertebral body heights are normal. Acute fractures are present.  Soft tissues and spinal canal: No prevertebral fluid or swelling. No visible canal hematoma.  Disc levels: No significant disc disease or focal stenosis is present.  Upper chest: The lung apices are clear. The thoracic inlet is within normal limits.  IMPRESSION: 1. No acute fracture or traumatic subluxation. 2. Stable appearance of  the cervical spine.   Electronically Signed By: Marin Roberts M.D. On: 08/03/2023 13:19  Cervical CT w/wo contrast: No results found for this or any previous visit.  Cervical CT w/wo contrast: No results found for this or any previous visit.  Cervical CT w contrast: No results found for this or any previous visit.  Cervical CT outside: No results found for this or any previous visit.  Cervical DG 1 view: No results found for this or any previous visit.  Cervical DG 2-3 views: No results found for this or any previous visit.  Cervical DG F/E views: No results found for this or any previous visit.  Cervical DG 2-3 clearing views: No results found for this or any previous visit.  Cervical DG Bending/F/E views: No results found for this or any previous visit.  Cervical DG complete: No results found for this or any previous visit.  Cervical DG Myelogram views: No results found for this or any previous visit.  Cervical DG Myelogram views: No results found for this or any previous visit.  Cervical Discogram views: No results found for this or any previous visit.   Shoulder Imaging: Shoulder-R MR w contrast: No results found for this or any previous visit.  Shoulder-L MR w contrast: No results found for this or any previous visit.  Shoulder-R MR w/wo contrast: No results found for this or any previous visit.  Shoulder-L MR w/wo contrast: No results found for this or any previous visit.  Shoulder-R MR wo contrast: No results found for this or any previous visit.  Shoulder-L MR wo contrast: No results found for this or any previous visit.  Shoulder-R CT w contrast: No results found for this or any previous visit.  Shoulder-L CT w contrast: No results found for this or any previous visit.  Shoulder-R CT w/wo contrast: No results found for this or any previous visit.  Shoulder-L CT w/wo contrast: No results found for this or any previous visit.  Shoulder-R CT wo  contrast: No results found for this or any previous visit.  Shoulder-L CT wo contrast: No results found for this or any previous visit.  Shoulder-R DG Arthrogram: No results found for this or any previous visit.  Shoulder-L DG Arthrogram: No results found for this or any previous visit.  Shoulder-R DG 1 view: No results found for this or any previous visit.  Shoulder-L DG 1 view: No results found for this or any previous visit.  Shoulder-R DG: No results found for this or any previous visit.  Shoulder-L DG: No results found for this or any previous visit.   Thoracic Imaging: Thoracic MR wo contrast: No results found for this or any previous visit.  Thoracic MR wo contrast: No results found for this or any previous visit.  Thoracic MR w/wo contrast: No results found for this or any previous visit.  Thoracic MR w contrast: No results found for this or any previous  visit.  Thoracic CT wo contrast: Results for orders placed during the hospital encounter of 08/03/23  CT Thoracic Spine Wo Contrast  Narrative CLINICAL DATA:  Fall from bed.  Abnormal x-ray  EXAM: CT THORACIC SPINE WITHOUT CONTRAST  TECHNIQUE: Multidetector CT images of the thoracic were obtained using the standard protocol without intravenous contrast.  RADIATION DOSE REDUCTION: This exam was performed according to the departmental dose-optimization program which includes automated exposure control, adjustment of the mA and/or kV according to patient size and/or use of iterative reconstruction technique.  COMPARISON:  Same-day x-ray  FINDINGS: Alignment: Normal.  Vertebrae: Acute mild superior endplate compression fractures of the T1, T2, T3, and T4 vertebral bodies (series 7, images 33-36). Less than 20% vertebral body height loss at each level. There is no bony retropulsion. No fracture extension into the posterior elements. The remaining thoracic vertebral body heights are maintained. No lytic or  sclerotic bone lesion.  Paraspinal and other soft tissues: Dependent bibasilar atelectasis. No acute abnormality.  Disc levels: Intervertebral disc heights are preserved. Minimal degenerative facet arthropathy, most notably within the upper thoracic spine.  IMPRESSION: Acute mild superior endplate compression fractures of the T1, T2, T3, and T4 vertebral bodies. Less than 20% vertebral body height loss at each level. No bony retropulsion.   Electronically Signed By: Duanne Guess D.O. On: 08/03/2023 16:11  Thoracic CT w/wo contrast: No results found for this or any previous visit.  Thoracic CT w/wo contrast: No results found for this or any previous visit.  Thoracic CT w contrast: No results found for this or any previous visit.  Thoracic DG 2-3 views: Results for orders placed during the hospital encounter of 08/17/23  DG Thoracic Spine 2 View  Narrative CLINICAL DATA:  Upper thoracic spine compression fractures. Continued pain.  EXAM: THORACIC SPINE 2 VIEWS  COMPARISON:  08/03/2023, CT 08/03/2023  FINDINGS: Examination demonstrates evidence of patient's subtle superior endplate compression fractures over the upper thoracic spine unchanged from previous exams. No new or worsening compression fracture. Disc spaces are normal. Vertebral body alignment is normal. Pedicles are intact.  IMPRESSION: Stable subtle superior endplate compression fractures over the upper thoracic spine. No new or worsening compression fracture.   Electronically Signed By: Elberta Fortis M.D. On: 09/06/2023 11:37  Thoracic DG 4 views: No results found for this or any previous visit.  Thoracic DG: Results for orders placed during the hospital encounter of 08/14/23  [facility] THORACOLUMBAR SPINE  Narrative CLINICAL DATA:  Patient states upper thoracic spine compression fracture with mid back pain.  EXAM: THORACOLUMBAR SPINE 1V  COMPARISON:  08/03/2023  FINDINGS: Vertebral body  alignment and heights are normal disc space heights are within normal. No compression fracture or subluxation. Pedicles are intact. Remainder the exam is unremarkable.  IMPRESSION: Negative.   Electronically Signed By: Elberta Fortis M.D. On: 09/06/2023 11:32  Thoracic DG w/swimmers view: No results found for this or any previous visit.  Thoracic DG Myelogram views: No results found for this or any previous visit.  Thoracic DG Myelogram views: No results found for this or any previous visit.   Lumbosacral Imaging: Lumbar MR wo contrast: No results found for this or any previous visit.  Lumbar MR wo contrast: No results found for this or any previous visit.  Lumbar MR w/wo contrast: No results found for this or any previous visit.  Lumbar MR w/wo contrast: No results found for this or any previous visit.  Lumbar MR w contrast: No results found for this  or any previous visit.  Lumbar CT wo contrast: No results found for this or any previous visit.  Lumbar CT w/wo contrast: No results found for this or any previous visit.  Lumbar CT w/wo contrast: No results found for this or any previous visit.  Lumbar CT w contrast: No results found for this or any previous visit.  Lumbar DG 1V: No results found for this or any previous visit.  Lumbar DG 1V (Clearing): No results found for this or any previous visit.  Lumbar DG 2-3V (Clearing): No results found for this or any previous visit.  Lumbar DG 2-3 views: No results found for this or any previous visit.  Lumbar DG (Complete) 4+V: No results found for this or any previous visit.        Lumbar DG F/E views: No results found for this or any previous visit.        Lumbar DG Bending views: No results found for this or any previous visit.        Lumbar DG Myelogram views: No results found for this or any previous visit.  Lumbar DG Myelogram: No results found for this or any previous visit.  Lumbar DG Myelogram: No results  found for this or any previous visit.  Lumbar DG Myelogram: No results found for this or any previous visit.  Lumbar DG Myelogram Lumbosacral: No results found for this or any previous visit.  Lumbar DG Diskogram views: No results found for this or any previous visit.  Lumbar DG Diskogram views: No results found for this or any previous visit.  Lumbar DG Epidurogram OP: No results found for this or any previous visit.  Lumbar DG Epidurogram IP: No results found for this or any previous visit.   Sacroiliac Joint Imaging: Sacroiliac Joint DG: No results found for this or any previous visit.  Sacroiliac Joint MR w/wo contrast: No results found for this or any previous visit.  Sacroiliac Joint MR wo contrast: No results found for this or any previous visit.   Spine Imaging: Whole Spine DG Myelogram views: No results found for this or any previous visit.  Whole Spine MR Mets screen: No results found for this or any previous visit.  Whole Spine MR Mets screen: No results found for this or any previous visit.  Whole Spine MR w/wo: No results found for this or any previous visit.  MRA Spinal Canal w/ cm: No results found for this or any previous visit.  MRA Spinal Canal wo/ cm: No results found for this or any previous visit.  MRA Spinal Canal w/wo cm: No results found for this or any previous visit.  Spine Outside MR Films: No results found for this or any previous visit.  Spine Outside CT Films: No results found for this or any previous visit.  CT-Guided Biopsy: No results found for this or any previous visit.  CT-Guided Needle Placement: No results found for this or any previous visit.  DG Spine outside: No results found for this or any previous visit.  IR Spine outside: No results found for this or any previous visit.  NM Spine outside: No results found for this or any previous visit.   Hip Imaging: Hip-R MR w contrast: No results found for this or any previous  visit.  Hip-L MR w contrast: No results found for this or any previous visit.  Hip-R MR w/wo contrast: No results found for this or any previous visit.  Hip-L MR w/wo contrast: No results found for  this or any previous visit.  Hip-R MR wo contrast: No results found for this or any previous visit.  Hip-L MR wo contrast: No results found for this or any previous visit.  Hip-R CT w contrast: No results found for this or any previous visit.  Hip-L CT w contrast: No results found for this or any previous visit.  Hip-R CT w/wo contrast: No results found for this or any previous visit.  Hip-L CT w/wo contrast: No results found for this or any previous visit.  Hip-R CT wo contrast: No results found for this or any previous visit.  Hip-L CT wo contrast: No results found for this or any previous visit.  Hip-R DG 2-3 views: No results found for this or any previous visit.  Hip-L DG 2-3 views: No results found for this or any previous visit.  Hip-R DG Arthrogram: No results found for this or any previous visit.  Hip-L DG Arthrogram: No results found for this or any previous visit.  Hip-B DG Bilateral: No results found for this or any previous visit.  Hip-B DG Bilateral (5V): No results found for this or any previous visit.   Knee Imaging: Knee-R MR w contrast: No results found for this or any previous visit.  Knee-L MR w/o contrast: No results found for this or any previous visit.  Knee-R MR w/wo contrast: No results found for this or any previous visit.  Knee-L MR w/wo contrast: No results found for this or any previous visit.  Knee-R MR wo contrast: No results found for this or any previous visit.  Knee-L MR wo contrast: No results found for this or any previous visit.  Knee-R CT w contrast: No results found for this or any previous visit.  Knee-L CT w contrast: No results found for this or any previous visit.  Knee-R CT w/wo contrast: No results found for this or any  previous visit.  Knee-L CT w/wo contrast: No results found for this or any previous visit.  Knee-R CT wo contrast: No results found for this or any previous visit.  Knee-L CT wo contrast: No results found for this or any previous visit.  Knee-R DG 1-2 views: No results found for this or any previous visit.  Knee-L DG 1-2 views: No results found for this or any previous visit.  Knee-R DG 3 views: No results found for this or any previous visit.  Knee-L DG 3 views: No results found for this or any previous visit.  Knee-R DG 4 views: No results found for this or any previous visit.  Knee-L DG 4 views: No results found for this or any previous visit.  Knee-R DG Arthrogram: No results found for this or any previous visit.  Knee-L DG Arthrogram: No results found for this or any previous visit.   Ankle Imaging: Ankle-R DG Complete: No results found for this or any previous visit.  Ankle-L DG Complete: Results for orders placed during the hospital encounter of 05/24/21  DG Ankle Complete Left  Narrative CLINICAL DATA:  Pain after fall  EXAM: LEFT ANKLE COMPLETE - 3+ VIEW  COMPARISON:  None.  FINDINGS: There is no evidence of fracture, dislocation, or joint effusion. There is no evidence of arthropathy or other focal bone abnormality. Soft tissues are unremarkable.  IMPRESSION: Negative.   Electronically Signed By: Jasmine Pang M.D. On: 05/24/2021 18:50   Foot Imaging: Foot-R DG Complete: No results found for this or any previous visit.  Foot-L DG Complete: No results found  for this or any previous visit.   Elbow Imaging: Elbow-R DG Complete: No results found for this or any previous visit.  Elbow-L DG Complete: No results found for this or any previous visit.   Wrist Imaging: Wrist-R DG Complete: No results found for this or any previous visit.  Wrist-L DG Complete: No results found for this or any previous visit.   Hand Imaging: Hand-R DG Complete:  No results found for this or any previous visit.  Hand-L DG Complete: No results found for this or any previous visit.   Complexity Note: Imaging results reviewed.                         ROS  Cardiovascular: {Hx; Cardiovascular History:210120525} Pulmonary or Respiratory: {Hx; Pumonary and/or Respiratory History:210120523} Neurological: {Hx; Neurological:210120504} Psychological-Psychiatric: {Hx; Psychological-Psychiatric History:210120512} Gastrointestinal: {Hx; Gastrointestinal:210120527} Genitourinary: {Hx; Genitourinary:210120506} Hematological: {Hx; Hematological:210120510} Endocrine: {Hx; Endocrine history:210120509} Rheumatologic: {Hx; Rheumatological:210120530} Musculoskeletal: {Hx; Musculoskeletal:210120528} Work History: {Hx; Work history:210120514}  Allergies  Miranda Fleming is allergic to amoxicillin and latex.  Laboratory Chemistry Profile   Renal Lab Results  Component Value Date   BUN 7 05/24/2021   CREATININE 0.66 05/24/2021   GFRAA >60 12/18/2017   GFRNONAA >60 05/24/2021   PROTEINUR 100 (A) 05/24/2021     Electrolytes Lab Results  Component Value Date   NA 136 05/24/2021   K 3.3 (L) 05/24/2021   CL 97 (L) 05/24/2021   CALCIUM 10.4 (H) 05/24/2021     Hepatic Lab Results  Component Value Date   AST 24 05/24/2021   ALT 14 05/24/2021   ALBUMIN 4.5 05/24/2021   ALKPHOS 55 05/24/2021   LIPASE 35 05/24/2021     ID Lab Results  Component Value Date   SARSCOV2NAA NEGATIVE 05/24/2021     Bone Lab Results  Component Value Date   VD25OH 50.8 12/18/2017     Endocrine Lab Results  Component Value Date   GLUCOSE 163 (H) 05/24/2021   GLUCOSEU NEGATIVE 05/24/2021   TSH 3.332 11/13/2015     Neuropathy Lab Results  Component Value Date   VITAMINB12 1,409 (H) 12/18/2017     CNS No results found for: "COLORCSF", "APPEARCSF", "RBCCOUNTCSF", "WBCCSF", "POLYSCSF", "LYMPHSCSF", "EOSCSF", "PROTEINCSF", "GLUCCSF", "JCVIRUS", "CSFOLI", "IGGCSF",  "LABACHR", "ACETBL"   Inflammation (CRP: Acute  ESR: Chronic) Lab Results  Component Value Date   LATICACIDVEN 1.0 05/24/2021     Rheumatology Lab Results  Component Value Date   ANA Negative 11/26/2017     Coagulation Lab Results  Component Value Date   PLT 195 05/24/2021     Cardiovascular Lab Results  Component Value Date   HGB 14.4 05/24/2021   HCT 39.7 05/24/2021     Screening Lab Results  Component Value Date   SARSCOV2NAA NEGATIVE 05/24/2021     Cancer No results found for: "CEA", "CA125", "LABCA2"   Allergens No results found for: "ALMOND", "APPLE", "ASPARAGUS", "AVOCADO", "BANANA", "BARLEY", "BASIL", "BAYLEAF", "GREENBEAN", "LIMABEAN", "WHITEBEAN", "BEEFIGE", "REDBEET", "BLUEBERRY", "BROCCOLI", "CABBAGE", "MELON", "CARROT", "CASEIN", "CASHEWNUT", "CAULIFLOWER", "CELERY"     Note: Lab results reviewed.  PFSH  Drug: Miranda Fleming  has no history on file for drug use. Alcohol:  reports current alcohol use of about 4.0 standard drinks of alcohol per week. Tobacco:  reports that she quit smoking about 2 years ago. Her smoking use included cigarettes. She started smoking about 43 years ago. She has a 61.6 pack-year smoking history. She has never used smokeless tobacco. Medical:  has a past medical history  of DVT, lower extremity, distal, acute (HCC), Palpitations (11/23/2015), SUI (stress urinary incontinence, female), and Wears glasses. Family: family history includes Breast cancer in her mother; Colon cancer in her paternal grandfather; Diabetes in her maternal aunt; Healthy in her brother and father; Osteopenia in her sister; Osteoporosis in her mother; Ovarian cancer in her maternal grandmother and mother.  Past Surgical History:  Procedure Laterality Date   ABDOMINAL HYSTERECTOMY  1995   W/ RIGHT SALPINGOOPHORECTOMY   EXPLORATORY LAPAROTOMY W/  LEFT SALPINGOOPHORECTOMY  01-25-2001   LAPAROSCOPY'S AND LAPAROTOMY'S FOR PERSISTANT OVARIAN CYST  X 5  1987;  1989;   1990;  1993;  1994   PUBOVAGINAL SLING N/A 10/31/2013   Procedure: LYNX PUBO-VAGINAL SLING;  Surgeon: Kathi Ludwig, MD;  Location: [facility];  Service: Urology;  Laterality: N/A;   Active Ambulatory Problems    Diagnosis Date Noted   Ovarian cyst    Anxiety    Stress incontinence in female 10/31/2013   DVT of lower limb, acute (HCC) 10/09/2015   Palpitations 11/23/2015   Compression fracture of thoracic vertebra with routine healing 08/17/2023   Resolved Ambulatory Problems    Diagnosis Date Noted   No Resolved Ambulatory Problems   Past Medical History:  Diagnosis Date   DVT, lower extremity, distal, acute (HCC)    SUI (stress urinary incontinence, female)    Wears glasses    Constitutional Exam  General appearance: Well nourished, well developed, and well hydrated. In no apparent acute distress There were no vitals filed for this visit. BMI Assessment: Estimated body mass index is 19.47 kg/m as calculated from the following:   Height as of 11/18/23: 5\' 5"  (1.651 m).   Weight as of 11/18/23: 117 lb (53.1 kg).  BMI interpretation table: BMI level Category Range association with higher incidence of chronic pain  <18 kg/m2 Underweight   18.5-24.9 kg/m2 Ideal body weight   25-29.9 kg/m2 Overweight Increased incidence by 20%  30-34.9 kg/m2 Obese (Class I) Increased incidence by 68%  35-39.9 kg/m2 Severe obesity (Class II) Increased incidence by 136%  >40 kg/m2 Extreme obesity (Class III) Increased incidence by 254%   Patient's current BMI Ideal Body weight  There is no height or weight on file to calculate BMI. Patient weight not recorded   BMI Readings from Last 4 Encounters:  11/18/23 19.47 kg/m  09/21/23 19.47 kg/m  08/17/23 19.47 kg/m  08/03/23 18.64 kg/m   Wt Readings from Last 4 Encounters:  11/18/23 117 lb (53.1 kg)  09/21/23 117 lb (53.1 kg)  08/17/23 117 lb (53.1 kg)  08/03/23 112 lb (50.8 kg)    Psych/Mental status: Alert,  oriented x 3 (person, place, & time)       Eyes: PERLA Respiratory: No evidence of acute respiratory distress  Assessment  Primary Diagnosis & Pertinent Problem List: There were no encounter diagnoses.  Visit Diagnosis (New problems to examiner): No diagnosis found. Plan of Care (Initial workup plan)  Note: Miranda Fleming was reminded that as per protocol, today's visit has been an evaluation only. We have not taken over the patient's controlled substance management.  Problem-specific plan: Assessment and Plan            Lab Orders  No laboratory test(s) ordered today   Imaging Orders  No imaging studies ordered today   Referral Orders  No referral(s) requested today   Procedure Orders    No procedure(s) ordered today   Pharmacotherapy (current): Medications ordered:  No orders of the defined  types were placed in this encounter.  Medications administered during this visit: Leona L. Andria Meuse had no medications administered during this visit.   Analgesic Pharmacotherapy:  Opioid Analgesics: For patients currently taking or requesting to take opioid analgesics, in accordance with [facility] Guidelines, we will assess their risks and indications for the use of these substances. After completing our evaluation, we may offer recommendations, but we no longer take patients for medication management. The prescribing physician will ultimately decide, based on his/her training and level of comfort whether to adopt any of the recommendations, including whether or not to prescribe such medicines.  Membrane stabilizer: To be determined at a later time  Muscle relaxant: To be determined at a later time  NSAID: To be determined at a later time  Other analgesic(s): To be determined at a later time   Interventional management options: Miranda Fleming was informed that there is no guarantee that she would be a candidate for interventional therapies. The decision will be  based on the results of diagnostic studies, as well as Miranda Fleming's risk profile.  Procedure(s) under consideration:  Pending results of ordered studies      Interventional Therapies  Risk Factors  Considerations  Medical Comorbidities:     Planned  Pending:      Under consideration:   Pending   Completed:   None at this time   Therapeutic  Palliative (PRN) options:   None established   Completed by other providers:   None reported       Provider-requested follow-up: No follow-ups on file.  Future Appointments  Date Time Provider Department Center  12/07/2023  2:00 PM Delano Metz, MD [facility] None  02/17/2024  1:30 PM Lovenia Kim, MD [facility] None    Duration of encounter: *** minutes.  Total time on encounter, as per AMA guidelines included both the face-to-face and non-face-to-face time personally spent by the physician and/or other qualified health care professional(s) on the day of the encounter (includes time in activities that require the physician or other qualified health care professional and does not include time in activities normally performed by clinical staff). Physician's time may include the following activities when performed: Preparing to see the patient (e.g., pre-charting review of records, searching for previously ordered imaging, lab work, and nerve conduction tests) Review of prior analgesic pharmacotherapies. Reviewing PMP Interpreting ordered tests (e.g., lab work, imaging, nerve conduction tests) Performing post-procedure evaluations, including interpretation of diagnostic procedures Obtaining and/or reviewing separately obtained history Performing a medically appropriate examination and/or evaluation Counseling and educating the patient/family/caregiver Ordering medications, tests, or procedures Referring and communicating with other health care professionals (when not separately reported) Documenting clinical information in the  electronic or other health record Independently interpreting results (not separately reported) and communicating results to the patient/ family/caregiver Care coordination (not separately reported)  Note by: Oswaldo Done, MD (AI and TTS technology used. I apologize for any typographical errors that were not detected and corrected.) Date: 12/07/2023; Time: 6:16 PM

## 2023-12-07 ENCOUNTER — Ambulatory Visit: Payer: Commercial Managed Care - PPO | Attending: Pain Medicine | Admitting: Pain Medicine

## 2023-12-07 VITALS — BP 119/72 | HR 93 | Temp 98.0°F | Resp 16 | Ht 65.0 in | Wt 115.0 lb

## 2023-12-07 DIAGNOSIS — S22000S Wedge compression fracture of unspecified thoracic vertebra, sequela: Secondary | ICD-10-CM | POA: Diagnosis present

## 2023-12-07 DIAGNOSIS — Z79899 Other long term (current) drug therapy: Secondary | ICD-10-CM | POA: Insufficient documentation

## 2023-12-07 DIAGNOSIS — M546 Pain in thoracic spine: Secondary | ICD-10-CM | POA: Diagnosis present

## 2023-12-07 DIAGNOSIS — M899 Disorder of bone, unspecified: Secondary | ICD-10-CM | POA: Insufficient documentation

## 2023-12-07 DIAGNOSIS — G8929 Other chronic pain: Secondary | ICD-10-CM | POA: Insufficient documentation

## 2023-12-07 DIAGNOSIS — G894 Chronic pain syndrome: Secondary | ICD-10-CM | POA: Diagnosis present

## 2023-12-07 DIAGNOSIS — Z789 Other specified health status: Secondary | ICD-10-CM | POA: Diagnosis present

## 2023-12-07 DIAGNOSIS — M720 Palmar fascial fibromatosis [Dupuytren]: Secondary | ICD-10-CM | POA: Insufficient documentation

## 2023-12-07 DIAGNOSIS — M8580 Other specified disorders of bone density and structure, unspecified site: Secondary | ICD-10-CM | POA: Insufficient documentation

## 2023-12-07 NOTE — Progress Notes (Signed)
Safety precautions to be maintained throughout the outpatient stay will include: orient to surroundings, keep bed in low position, maintain call bell within reach at all times, provide assistance with transfer out of bed and ambulation.

## 2023-12-07 NOTE — Patient Instructions (Addendum)
Epidural Steroid Injection  An epidural steroid injection is a shot of steroid medicine, also called cortisone, and a numbing medicine that is given into the epidural space. This space is between the spinal cord and the bones of the back. This shot helps relieve pain caused by an irritated or swollen nerve root. The pain relief you get from the injection depends on the cause of your condition and how long your pain lasts. You may have a period of slightly more pain after your injection, before the steroid medicine takes effect. This medicine usually starts working within 1-3 days. In some cases, you might need 7-10 days to feel the full effect. Tell your health care provider about: Any allergies you have. All medicines you are taking, including vitamins, herbs, eye drops, creams, and over-the-counter medicines. Any problems you or family members have had with anesthesia. Any bleeding problems you have. Any surgeries you have had. Any medical conditions you have. Whether you are pregnant or may be pregnant. What are the risks? Your health care provider will talk with you about risks. These may include: Headache. Bleeding. Infection. Allergic reaction to medicines or dyes. Nerve damage. Not being able to move (paralysis). This is rare. What happens before the procedure? Medicines Ask your provider about: Changing or stopping your regular medicines. These include any diabetes medicines or blood thinners you take. Taking medicines such as aspirin and ibuprofen. These medicines can thin your blood. Do not take them unless your provider tells you to. Taking over-the-counter medicines, vitamins, herbs, and supplements. General instructions Follow instructions from your provider about what you may eat and drink. Ask your provider what steps will be taken to help prevent infection. If you will be going home right after the procedure, plan to have a responsible adult: Take you home from the  hospital or clinic. You will not be allowed to drive. Care for you for the time you are told. If you have diabetes or prediabetes, talk with your provider about how to manage your blood sugar (glucose). Steroid medicine can make your blood sugar go up and stay high for a few days. Your provider can make a plan to make sure your blood sugar level stays under control. What happens during the procedure? An IV will be inserted into one of your veins. You may be given a sedative to help you relax. You will be asked to sit or lie on your side. The injection site will be cleaned. An X-ray machine will be used to guide the needle close to the nerve that is causing pain. A needle will be put through your skin into the epidural space. This may cause you some discomfort. Contrast dye may be injected at the site to make sure that the steroid medicine will be sent to the exact place it needs to go. The steroid medicine and a numbing medicine will be injected into the epidural space for pain relief. The needle will be removed. A bandage (dressing) will be put over the injection site. The procedure may vary among providers and hospitals. What happens after the procedure? Your blood pressure, heart rate, breathing rate, and blood oxygen level will be monitored until you leave the hospital or clinic. Your IV will be removed. Your arm or leg may feel weak or numb for a few hours. This information is not intended to replace advice given to you by your health care provider. Make sure you discuss any questions you have with your health care provider. Document Revised:  02/13/2023 Document Reviewed: 06/06/2022 Elsevier Patient Education  2024 [employer]. ______________________________________________________________________    New Patients  Welcome to [facility].   Initial Visit The first or initial visit consists of an evaluation only.    Interventional pain management.  We offer therapies other than opioid controlled substances to manage chronic pain. These include, but are not limited to, diagnostic, therapeutic, and palliative specialized injection therapies (i.e.: Epidural Steroids, Facet Blocks, etc.). We specialize in a variety of nerve blocks as well as radiofrequency treatments. We offer pain implant evaluations and trials, as well as follow up management. In addition we also provide a variety joint injections, including Viscosupplementation (AKA: Gel Therapy).  Prescription Pain Medication. We specialize in alternatives to opioids. We can provide evaluations and recommendations for/of pharmacologic therapies based on CDC Guidelines.  We no longer take patients for long-term medication management. We will not be taking over your pain medications.  ______________________________________________________________________      ______________________________________________________________________    Patient Information update  To: All of our patients.  Re: Name change.  It has been made official that our current name, "[facility]"   will soon be changed to "[facility]".   The purpose of this change is to eliminate any confusion created by the concept of our practice being a "Medication Management Pain Clinic". In the past this has led to the misconception that we treat pain primarily by the use of prescription medications.  Nothing can be farther from the truth.   Understanding PAIN MANAGEMENT: To further understand what our practice does, you first have to understand that "Pain Management" is a subspecialty that requires additional training once a physician has completed their specialty training, which can be in either Anesthesia, Neurology, Psychiatry, or Physical Medicine and Rehabilitation (PMR). Each one of  these contributes to the final approach taken by each physician to the management of their patient's pain. To be a "Pain Management Specialist" you must have first completed one of the specialty trainings below.  Anesthesiologists - trained in clinical pharmacology and interventional techniques such as nerve blockade and regional as well as central neuroanatomy. They are trained to block pain before, during, and after surgical interventions.  Neurologists - trained in the diagnosis and pharmacological treatment of complex neurological conditions, such as Multiple Sclerosis, Parkinson's, spinal cord injuries, and other systemic conditions that may be associated with symptoms that may include but are not limited to pain. They tend to rely primarily on the treatment of chronic pain using prescription medications.  Psychiatrist - trained in conditions affecting the psychosocial wellbeing of patients including but not limited to depression, anxiety, schizophrenia, personality disorders, addiction, and other substance use disorders that may be associated with chronic pain. They tend to rely primarily on the treatment of chronic pain using prescription medications.   Physical Medicine and Rehabilitation (PMR) physicians, also known as physiatrists - trained to treat a wide variety of medical conditions affecting the brain, spinal cord, nerves, bones, joints, ligaments, muscles, and tendons. Their training is primarily aimed at treating patients that have suffered injuries that have caused severe physical impairment. Their training is primarily aimed at the physical therapy and rehabilitation of those patients. They may also work alongside orthopedic surgeons or neurosurgeons using their expertise in assisting surgical patients to recover after their surgeries.  INTERVENTIONAL PAIN MANAGEMENT is sub-subspecialty of Pain Management.  Our physicians are Board-certified in  Anesthesia, Pain Management, and  Interventional Pain Management.  This meaning that not only have they been trained and Board-certified in their specialty of Anesthesia, and subspecialty of Pain Management, but they have also received further training in the sub-subspecialty of Interventional Pain Management, in order to become Board-certified as INTERVENTIONAL PAIN MANAGEMENT SPECIALIST.    Mission: Our goal is to use our skills in  INTERVENTIONAL PAIN MANAGEMENT as alternatives to the chronic use of prescription opioid medications for the treatment of pain. To make this more clear, we have changed our name to reflect what we do and offer. We will continue to offer medication management assessment and recommendations, but we will not be taking over any patient's medication management.  ______________________________________________________________________      ______________________________________________________________________    Procedure instructions  Stop blood-thinners  Do not eat or drink fluids (other than water) for 6 hours before your procedure  No water for 2 hours before your procedure  Take your blood pressure medicine with a sip of water  Arrive 30 minutes before your appointment  If sedation is planned, bring suitable driver. Pennie Banter, Benedetto Goad, & public transportation are NOT APPROVED)  Carefully read the "Preparing for your procedure" detailed instructions  If you have questions call us at [phone]  Procedure appointments are for procedures only. NO medication refills or new problem evaluations.   ______________________________________________________________________      ______________________________________________________________________    Preparing for your procedure  Appointments: If you think you may not be able to keep your appointment, call 24-48 hours in advance to cancel. We need time to make it available to others.  Procedure visits are for procedures only. During your  procedure appointment there will be: NO Prescription Refills*. NO medication changes or discussions*. NO discussion of disability issues*. NO unrelated pain problem evaluations*. NO evaluations to order other pain procedures*. *These will be addressed at a separate and distinct evaluation encounter on the provider's evaluation schedule and not during procedure days.  Instructions: Food intake: Avoid eating anything solid for at least 8 hours prior to your procedure. Clear liquid intake: You may take clear liquids such as water up to 2 hours prior to your procedure. (No carbonated drinks. No soda.) Transportation: Unless otherwise stated by your physician, bring a driver. (Driver cannot be a Market researcher, Pharmacist, community, or any other form of public transportation.) Morning Medicines: Except for blood thinners, take all of your other morning medications with a sip of water. Make sure to take your heart and blood pressure medicines. If your blood pressure's lower number is above 100, the case will be rescheduled. Blood thinners: Make sure to stop your blood thinners as instructed.  If you take a blood thinner, but were not instructed to stop it, call our office [phone] and ask to talk to a nurse. Not stopping a blood thinner prior to certain procedures could lead to serious complications. Diabetics on insulin: Notify the staff so that you can be scheduled 1st case in the morning. If your diabetes requires high dose insulin, take only  of your normal insulin dose the morning of the procedure and notify the staff that you have done so. Preventing infections: Shower with an antibacterial soap the morning of your procedure.  Build-up your immune system: Take 1000 mg of Vitamin C with every meal (3 times a day) the day prior to your procedure. Antibiotics: Inform the nursing staff if you are taking any antibiotics or if you have any conditions that may require antibiotics prior  to procedures. (Example: recent joint  implants)   Pregnancy: If you are pregnant make sure to notify the nursing staff. Not doing so may result in injury to the fetus, including death.  Sickness: If you have a cold, fever, or any active infections, call and cancel or reschedule your procedure. Receiving steroids while having an infection may result in complications. Arrival: You must be in the facility at least 30 minutes prior to your scheduled procedure. Tardiness: Your scheduled time is also the cutoff time. If you do not arrive at least 15 minutes prior to your procedure, you will be rescheduled.  Children: Do not bring any children with you. Make arrangements to keep them home. Dress appropriately: There is always a possibility that your clothing may get soiled. Avoid long dresses. Valuables: Do not bring any jewelry or valuables.  Reasons to call and reschedule or cancel your procedure: (Following these recommendations will minimize the risk of a serious complication.) Surgeries: Avoid having procedures within 2 weeks of any surgery. (Avoid for 2 weeks before or after any surgery). Flu Shots: Avoid having procedures within 2 weeks of a flu shots or . (Avoid for 2 weeks before or after immunizations). Barium: Avoid having a procedure within 7-10 days after having had a radiological study involving the use of radiological contrast. (Myelograms, Barium swallow or enema study). Heart attacks: Avoid any elective procedures or surgeries for the initial 6 months after a "Myocardial Infarction" (Heart Attack). Blood thinners: It is imperative that you stop these medications before procedures. Let us know if you if you take any blood thinner.  Infection: Avoid procedures during or within two weeks of an infection (including chest colds or gastrointestinal problems). Symptoms associated with infections include: Localized redness, fever, chills, night sweats or profuse sweating, burning sensation when voiding, cough, congestion, stuffiness,  runny nose, sore throat, diarrhea, nausea, vomiting, cold or Flu symptoms, recent or current infections. It is specially important if the infection is over the area that we intend to treat. Heart and lung problems: Symptoms that may suggest an active cardiopulmonary problem include: cough, chest pain, breathing difficulties or shortness of breath, dizziness, ankle swelling, uncontrolled high or unusually low blood pressure, and/or palpitations. If you are experiencing any of these symptoms, cancel your procedure and contact your primary care physician for an evaluation.  Remember:  Regular Business hours are:  Monday to Thursday 8:00 AM to 4:00 PM  Provider's Schedule: Delano Metz, MD:  Procedure days: Tuesday and Thursday 7:30 AM to 4:00 PM  Edward Jolly, MD:  Procedure days: Monday and Wednesday 7:30 AM to 4:00 PM Last  Updated: 10/20/2023 ______________________________________________________________________      ______________________________________________________________________    General Risks and Possible Complications  Patient Responsibilities: It is important that you read this as it is part of your informed consent. It is our duty to inform you of the risks and possible complications associated with treatments offered to you. It is your responsibility as a patient to read this and to ask questions about anything that is not clear or that you believe was not covered in this document.  Patient's Rights: You have the right to refuse treatment. You also have the right to change your mind, even after initially having agreed to have the treatment done. However, under this last option, if you wait until the last second to change your mind, you may be charged for the materials used up to that point.  Introduction: Medicine is not an Visual merchandiser. Everything in Medicine,  including the lack of treatment(s), carries the potential for danger, harm, or loss (which is by definition:  Risk). In Medicine, a complication is a secondary problem, condition, or disease that can aggravate an already existing one. All treatments carry the risk of possible complications. The fact that a side effects or complications occurs, does not imply that the treatment was conducted incorrectly. It must be clearly understood that these can happen even when everything is done following the highest safety standards.  No treatment: You can choose not to proceed with the proposed treatment alternative. The "PRO(s)" would include: avoiding the risk of complications associated with the therapy. The "CON(s)" would include: not getting any of the treatment benefits. These benefits fall under one of three categories: diagnostic; therapeutic; and/or palliative. Diagnostic benefits include: getting information which can ultimately lead to improvement of the disease or symptom(s). Therapeutic benefits are those associated with the successful treatment of the disease. Finally, palliative benefits are those related to the decrease of the primary symptoms, without necessarily curing the condition (example: decreasing the pain from a flare-up of a chronic condition, such as incurable terminal cancer).  General Risks and Complications: These are associated to most interventional treatments. They can occur alone, or in combination. They fall under one of the following six (6) categories: no benefit or worsening of symptoms; bleeding; infection; nerve damage; allergic reactions; and/or death. No benefits or worsening of symptoms: In Medicine there are no guarantees, only probabilities. No healthcare provider can ever guarantee that a medical treatment will work, they can only state the probability that it may. Furthermore, there is always the possibility that the condition may worsen, either directly, or indirectly, as a consequence of the treatment. Bleeding: This is more common if the patient is taking a blood thinner, either  prescription or over the counter (example: Goody Powders, Fish oil, Aspirin, Garlic, etc.), or if suffering a condition associated with impaired coagulation (example: Hemophilia, cirrhosis of the liver, low platelet counts, etc.). However, even if you do not have one on these, it can still happen. If you have any of these conditions, or take one of these drugs, make sure to notify your treating physician. Infection: This is more common in patients with a compromised immune system, either due to disease (example: diabetes, cancer, human immunodeficiency virus [HIV], etc.), or due to medications or treatments (example: therapies used to treat cancer and rheumatological diseases). However, even if you do not have one on these, it can still happen. If you have any of these conditions, or take one of these drugs, make sure to notify your treating physician. Nerve Damage: This is more common when the treatment is an invasive one, but it can also happen with the use of medications, such as those used in the treatment of cancer. The damage can occur to small secondary nerves, or to large primary ones, such as those in the spinal cord and brain. This damage may be temporary or permanent and it may lead to impairments that can range from temporary numbness to permanent paralysis and/or brain death. Allergic Reactions: Any time a substance or material comes in contact with our body, there is the possibility of an allergic reaction. These can range from a mild skin rash (contact dermatitis) to a severe systemic reaction (anaphylactic reaction), which can result in death. Death: In general, any medical intervention can result in death, most of the time due to an unforeseen complication. ______________________________________________________________________

## 2023-12-10 LAB — COMPLIANCE DRUG ANALYSIS, UR

## 2023-12-12 LAB — COMP. METABOLIC PANEL (12)
AST: 39 [IU]/L (ref 0–40)
Albumin: 4.8 g/dL (ref 3.8–4.9)
Alkaline Phosphatase: 77 [IU]/L (ref 44–121)
BUN/Creatinine Ratio: 19 (ref 9–23)
BUN: 16 mg/dL (ref 6–24)
Bilirubin Total: 0.3 mg/dL (ref 0.0–1.2)
Calcium: 9.9 mg/dL (ref 8.7–10.2)
Chloride: 97 mmol/L (ref 96–106)
Creatinine, Ser: 0.85 mg/dL (ref 0.57–1.00)
Globulin, Total: 2.1 g/dL (ref 1.5–4.5)
Glucose: 100 mg/dL — ABNORMAL HIGH (ref 70–99)
Potassium: 5.1 mmol/L (ref 3.5–5.2)
Sodium: 136 mmol/L (ref 134–144)
Total Protein: 6.9 g/dL (ref 6.0–8.5)
eGFR: 80 mL/min/{1.73_m2} (ref >59)

## 2023-12-12 LAB — VITAMIN B12: Vitamin B-12: 915 pg/mL (ref 232–1245)

## 2023-12-12 LAB — C-REACTIVE PROTEIN: CRP: <1 mg/L (ref 0–10)

## 2023-12-12 LAB — 25-HYDROXY VITAMIN D LCMS D2+D3
25-Hydroxy, Vitamin D-2: 41 ng/mL
25-Hydroxy, Vitamin D-3: 27 ng/mL
25-Hydroxy, Vitamin D: 68 ng/mL

## 2023-12-12 LAB — SEDIMENTATION RATE: Sed Rate: 3 mm/h (ref 0–40)

## 2023-12-12 LAB — MAGNESIUM: Magnesium: 2 mg/dL (ref 1.6–2.3)

## 2023-12-23 ENCOUNTER — Telehealth: Payer: Self-pay | Admitting: Family Medicine

## 2023-12-23 DIAGNOSIS — S22020D Wedge compression fracture of second thoracic vertebra, subsequent encounter for fracture with routine healing: Secondary | ICD-10-CM | POA: Diagnosis not present

## 2023-12-23 NOTE — Patient Instructions (Signed)

## 2023-12-23 NOTE — Progress Notes (Signed)
 PROVIDER NOTE: Interpretation of information contained herein should be left to medically-trained personnel. Specific patient instructions are provided elsewhere under "Patient Instructions" section of medical record. This document was created in part using STT-dictation technology, any transcriptional errors that may result from this process are unintentional.  Patient: Miranda Fleming Type: Established DOB: 1966-03-29 MRN: [id number] PCP: Pcp, No  Service: Procedure DOS: 12/24/2023 Setting: Ambulatory Location: Ambulatory outpatient facility Delivery: Face-to-face Provider: Oswaldo Done, MD Specialty: Interventional Pain Management Specialty designation: 09 Location: Outpatient facility Ref. Prov.: No ref. provider found       Interventional Therapy   Primary Reason for Visit: Interventional Pain Management Treatment. CC: No chief complaint on file.  Procedure:           Inter-Laminar Thoracic Epidural Steroid Block/Injection  #1  Laterality:  Midline Level: T2-3  Imaging: Fluoroscopic guidance Anesthesia: Local anesthesia (1-2% Lidocaine) Anxiolysis: IV Versed 2 mg Sedation: None.  No fentanyl. DOS: 12/24/2023 Performed by: Oswaldo Done, MD  Purpose: Diagnostic/Therapeutic Indications: Thoracic back pain, radicular pain, with degenerative disc disease severe enough to impact quality of life or function. 1. Chronic thoracic back pain (1ry area of Pain) (Midline)   2. Thoracic compression fracture, sequela (<20%) (T1, T2, T3, and T4)   3. Thoracic spine pain   4. Latex precautions, history of latex allergy    NAS-11 Pain score:   Pre-procedure: 9 /10   Post-procedure: 6 /10     Position / Prep / Materials:  Position: Prone  Prep solution: ChloraPrep (2% chlorhexidine gluconate and 70% isopropyl alcohol) Prep Area: Posterior Thoracolumbar (Upper back from shoulders to lower lumbar region).  Materials:  Tray: Epidural Needle(s) Type: Epidural  needle Gauge (G): 17 Length: Regular (3.5-in) Qty: 1  H&P (Pre-op Assessment):  Miranda Fleming is a 58 y.o. (year old), female patient, seen today for interventional treatment. She  has a past surgical history that includes EXPLORATORY LAPAROTOMY W/  LEFT SALPINGOOPHORECTOMY (01-25-2001); Abdominal hysterectomy (1995); LAPAROSCOPY'S AND LAPAROTOMY'S FOR PERSISTANT OVARIAN CYST (X 5  1987;  1989;  1990;  1993;  1994); and Pubovaginal sling (N/A, 10/31/2013). Miranda Fleming has a current medication list which includes the following prescription(s): alprazolam, calcium carbonate, cyanocobalamin, estradiol, bone essentials, multivitamin, fish oil, and tramadol. Her primarily concern today is the No chief complaint on file.  Initial Vital Signs:  Pulse/HCG Rate: 93ECG Heart Rate: 80 Temp: (!) 97.3 F (36.3 C) Resp: 16 BP: 125/70 SpO2: 100 %  BMI: Estimated body mass index is 18.64 kg/m as calculated from the following:   Height as of this encounter: 5\' 5"  (1.651 m).   Weight as of this encounter: 112 lb (50.8 kg).  Risk Assessment: Allergies: Reviewed. She is allergic to amoxicillin and latex.  Allergy Precautions: None required Coagulopathies: Reviewed. None identified.  Blood-thinner therapy: None at this time Active Infection(s): Reviewed. None identified. Miranda Fleming is afebrile  Site Confirmation: Miranda Fleming was asked to confirm the procedure and laterality before marking the site Procedure checklist: Completed Consent: Before the procedure and under the influence of no sedative(s), amnesic(s), or anxiolytics, the patient was informed of the treatment options, risks and possible complications. To fulfill our ethical and legal obligations, as recommended by the American Medical Association's Code of Ethics, I have informed the patient of my clinical impression; the nature and purpose of the treatment or procedure; the risks, benefits, and possible complications of the intervention; the  alternatives, including doing nothing; the risk(s) and benefit(s) of the alternative treatment(s) or procedure(s);  and the risk(s) and benefit(s) of doing nothing. The patient was provided information about the general risks and possible complications associated with the procedure. These may include, but are not limited to: failure to achieve desired goals, infection, bleeding, organ or nerve damage, allergic reactions, paralysis, and death. In addition, the patient was informed of those risks and complications associated to Spine-related procedures, such as failure to decrease pain; infection (i.e.: Meningitis, epidural or intraspinal abscess); bleeding (i.e.: epidural hematoma, subarachnoid hemorrhage, or any other type of intraspinal or peri-dural bleeding); organ or nerve damage (i.e.: Any type of peripheral nerve, nerve root, or spinal cord injury) with subsequent damage to sensory, motor, and/or autonomic systems, resulting in permanent pain, numbness, and/or weakness of one or several areas of the body; allergic reactions; (i.e.: anaphylactic reaction); and/or death. Furthermore, the patient was informed of those risks and complications associated with the medications. These include, but are not limited to: allergic reactions (i.e.: anaphylactic or anaphylactoid reaction(s)); adrenal axis suppression; blood sugar elevation that in diabetics may result in ketoacidosis or comma; water retention that in patients with history of congestive heart failure may result in shortness of breath, pulmonary edema, and decompensation with resultant heart failure; weight gain; swelling or edema; medication-induced neural toxicity; particulate matter embolism and blood vessel occlusion with resultant organ, and/or nervous system infarction; and/or aseptic necrosis of one or more joints. Finally, the patient was informed that Medicine is not an exact science; therefore, there is also the possibility of unforeseen or  unpredictable risks and/or possible complications that may result in a catastrophic outcome. The patient indicated having understood very clearly. We have given the patient no guarantees and we have made no promises. Enough time was given to the patient to ask questions, all of which were answered to the patient's satisfaction. Ms. Biel has indicated that she wanted to continue with the procedure. Attestation: I, the ordering provider, attest that I have discussed with the patient the benefits, risks, side-effects, alternatives, likelihood of achieving goals, and potential problems during recovery for the procedure that I have provided informed consent. Date  Time: 12/24/2023 10:11 AM  Pre-Procedure Preparation:  Monitoring: As per clinic protocol. Respiration, ETCO2, SpO2, BP, heart rate and rhythm monitor placed and checked for adequate function Safety Precautions: Patient was assessed for positional comfort and pressure points before starting the procedure. Time-out: I initiated and conducted the "Time-out" before starting the procedure, as per protocol. The patient was asked to participate by confirming the accuracy of the "Time Out" information. Verification of the correct person, site, and procedure were performed and confirmed by me, the nursing staff, and the patient. "Time-out" conducted as per Joint Commission's Universal Protocol (UP.01.01.01). Time: 1413 Start Time: 1413 hrs.  Description of Procedure:          Target Area: For Epidural Steroid injection(s), the target area is the  interlaminar space, initially targeting the lower border of the superior vertebral body lamina. Approach: Interlaminar approach. Area Prepped: Entire Posterior Thoracolumbar Region ChloraPrep (2% chlorhexidine gluconate and 70% isopropyl alcohol) Safety Precautions: Aspiration looking for blood return was conducted prior to all injections. At no point did we inject any substances, as a needle was being  advanced. No attempts were made at seeking any paresthesias. Safe injection practices and needle disposal techniques used. Medications properly checked for expiration dates. SDV (single dose vial) medications used. Description of the Procedure: Protocol guidelines were followed. The patient was placed in position over the fluoroscopy table. The target area was identified  and the area prepped in the usual manner. Skin & deeper tissues infiltrated with local anesthetic. Appropriate amount of time allowed to pass for local anesthetics to take effect. The procedure needles were then advanced to the target area. The inferior aspect of the superior lamina was contacted and the needle walked caudad, until the lamina was cleared. The epidural space was identified using "loss-of-resistance technique" with 0.9% PF-NSS (2-32mL), in a low friction 10cc LOR glass syringe. Proper needle placement was secured. Negative aspiration confirmed. Solution injected in intermittent fashion, asking for systemic symptoms every 0.5 cc of injectate. The needles were then removed and the area cleansed, making sure to leave some of the prepping solution behind to take advantage of its long term bactericidal properties. Vitals:   12/24/23 1415 12/24/23 1420 12/24/23 1422 12/24/23 1432  BP: (!) 143/81 (!) 148/81 (!) 148/95 120/72  Pulse:    76  Resp: 16 20 20 16   Temp:      SpO2: 100% 100% 100% 99%  Weight:      Height:        Start Time: 1413 hrs. End Time: 1422 hrs.  Imaging Guidance (Spinal):          Type of Imaging Technique: Fluoroscopy Guidance (Spinal) Indication(s): Fluoroscopy guidance for needle placement to enhance accuracy in procedures requiring precise needle localization for targeted delivery of medication in or near specific anatomical locations not easily accessible without such real-time imaging assistance. Exposure Time: Please see nurses notes. Contrast: Before injecting any contrast, we confirmed that the  patient did not have an allergy to iodine, shellfish, or radiological contrast. Once satisfactory needle placement was completed at the desired level, radiological contrast was injected. Contrast injected under live fluoroscopy. No contrast complications. See chart for type and volume of contrast used. Fluoroscopic Guidance: I was personally present during the use of fluoroscopy. "Tunnel Vision Technique" used to obtain the best possible view of the target area. Parallax error corrected before commencing the procedure. "Direction-depth-direction" technique used to introduce the needle under continuous pulsed fluoroscopy. Once target was reached, antero-posterior, oblique, and lateral fluoroscopic projection used confirm needle placement in all planes. Images permanently stored in EMR. Interpretation: I personally interpreted the imaging intraoperatively. Adequate needle placement confirmed in multiple planes. Appropriate spread of contrast into desired area was observed. No evidence of afferent or efferent intravascular uptake. No intrathecal or subarachnoid spread observed. Permanent images saved into the patient's record.  Antibiotic Prophylaxis:   Anti-infectives (From admission, onward)    None      Indication(s): None identified  Post-operative Assessment:  Post-procedure Vital Signs:  Pulse/HCG Rate: 7679 Temp: 98 F (36.7 C) Resp: 16 BP: 120/72 SpO2: 99 %  EBL: None  Complications: No immediate post-treatment complications observed by team, or reported by patient.  Note: The patient tolerated the entire procedure well. A repeat set of vitals were taken after the procedure and the patient was kept under observation following institutional policy, for this type of procedure. Post-procedural neurological assessment was performed, showing return to baseline, prior to discharge. The patient was provided with post-procedure discharge instructions, including a section on how to identify  potential problems. Should any problems arise concerning this procedure, the patient was given instructions to immediately contact us, at any time, without hesitation. In any case, we plan to contact the patient by telephone for a follow-up status report regarding this interventional procedure.  Comments:  No additional relevant information.  Plan of Care (POC)  Orders:  Orders Placed This  Encounter  Procedures   Thoracic Epidural Injection    Scheduling Instructions:     Side: Midline     Sedation: Patient's choice.     Timeframe: Today    Where will this procedure be performed?:   [facility]   [facility] C-ARM 1-60 MIN NO REPORT    Intraoperative interpretation by procedural physician at [facility].    Standing Status:   Standing    Number of Occurrences:   1    Reason for exam::   Assistance in needle guidance and placement for procedures requiring needle placement in or near specific anatomical locations not easily accessible without such assistance.   Informed Consent Details: Physician/Practitioner Attestation; Transcribe to consent form and obtain patient signature    Provider Attestation: I, Axzel Rockhill A. Laban Emperor, MD, (Pain Management Specialist), the physician/practitioner, attest that I have discussed with the patient the benefits, risks, side effects, alternatives, likelihood of achieving goals and potential problems during recovery for the procedure that I have provided informed consent.    Scheduling Instructions:     Nursing Order: Transcribe to consent form and obtain patient signature.     Note: Always confirm laterality of pain with Ms. Sotero, before procedure.    Physician/Practitioner attestation of informed consent for procedure/surgical case:   I, the physician/practitioner, attest that I have discussed with the patient the benefits, risks, side effects, alternatives, likelihood of achieving goals and potential problems during recovery for the  procedure that I have provided informed consent.    Procedure:   Thoracic Epidural Steroid Injection/Block under fluoroscopic guidance    Physician/Practitioner performing the procedure:   Victoire Deans A. Laban Emperor MD    Indication/Reason:   Upper (Thoracic) Back Pain with or without Thoracic Radicular Pain (Rib/Flank pain) secondary to Thoracic Degenerative Disc Disease and/or Thoracic Intervertebral Disc Displacement, with or without Thoracic Radiculitis/Radiculopathy.   Provide equipment / supplies at bedside    Procedural tray: Epidural Tray (Disposable  single use) Skin infiltration needle: Regular 1.5-in, 25-G, (x1) Block needle size: Regular standard Catheter: No catheter required    Standing Status:   Standing    Number of Occurrences:   1    Specify:   Epidural Tray   Saline lock IV    Have LR [phone] mL available and administer at 125 mL/hr if patient becomes hypotensive.    Standing Status:   Standing    Number of Occurrences:   1   Latex precautions    Activate Latex-Free Protocol.    Standing Status:   Standing    Number of Occurrences:   1   Chronic Opioid Analgesic:   Tramadol 50 mg tablet, 1 tab p.o. daily (# 30) (30/month) (last filled on 11/28/2023) MME/day: 10 mg/day   Medications ordered for procedure: Meds ordered this encounter  Medications   iohexol (OMNIPAQUE) 180 MG/ML injection 10 mL    Must be Myelogram-compatible. If not available, you may substitute with a water-soluble, non-ionic, hypoallergenic, myelogram-compatible radiological contrast medium.   lidocaine (XYLOCAINE) 2 % (with pres) injection 400 mg   pentafluoroprop-tetrafluoroeth (GEBAUERS) aerosol   sodium chloride flush (NS) 0.9 % injection 2 mL   ropivacaine (PF) 2 mg/mL (0.2%) (NAROPIN) injection 2 mL   dexamethasone (DECADRON) injection 10 mg   midazolam (VERSED) injection 0.5-2 mg    Make sure Flumazenil is available in the pyxis when using this medication. If oversedation occurs, administer  0.2 mg IV over 15 sec. If after 45 sec no response, administer 0.2  mg again over 1 min; may repeat at 1 min intervals; not to exceed 4 doses (1 mg)   Medications administered: We administered iohexol, lidocaine, pentafluoroprop-tetrafluoroeth, sodium chloride flush, ropivacaine (PF) 2 mg/mL (0.2%), dexamethasone, and midazolam.  See the medical record for exact dosing, route, and time of administration.  Follow-up plan:   Return in about 2 weeks (around 01/07/2024) for (Face2F), (PPE).       Interventional Therapies  Risk Factors  Considerations  Medical Comorbidities:  Hx. DVT     Planned  Pending:   Diagnostic/therapeutic midline/left T2-3 thoracic ESI #1    Under consideration:   Diagnostic/therapeutic midline/left T2-3 thoracic ESI #1    Completed:   None at this time   Therapeutic  Palliative (PRN) options:   None established   Completed by other providers:   None reported      Recent Visits Date Type Provider Dept  12/24/23 Procedure visit Delano Metz, MD [facility]  12/07/23 Office Visit Delano Metz, MD [facility]  Showing recent visits within past 90 days and meeting all other requirements Future Appointments Date Type Provider Dept  01/14/24 Appointment Delano Metz, MD [facility]  Showing future appointments within next 90 days and meeting all other requirements  Disposition: Discharge home  Discharge (Date  Time): 12/24/2023; 1437 hrs.   Primary Care Physician: Pcp, No Location: [facility] Note by: Oswaldo Done, MD (TTS technology used. I apologize for any typographical errors that were not detected and corrected.) Date: 12/24/2023; Time: 2:48 PM  Disclaimer:  Medicine is not an Visual merchandiser. The only guarantee in medicine is that nothing is guaranteed. It is important to note that the decision to proceed with this intervention was based on the information  collected from the patient. The Data and conclusions were drawn from the patient's questionnaire, the interview, and the physical examination. Because the information was provided in large part by the patient, it cannot be guaranteed that it has not been purposely or unconsciously manipulated. Every effort has been made to obtain as much relevant data as possible for this evaluation. It is important to note that the conclusions that lead to this procedure are derived in large part from the available data. Always take into account that the treatment will also be dependent on availability of resources and existing treatment guidelines, considered by other Pain Management Practitioners as being common knowledge and practice, at the time of the intervention. For Medico-Legal purposes, it is also important to point out that variation in procedural techniques and pharmacological choices are the acceptable norm. The indications, contraindications, technique, and results of the above procedure should only be interpreted and judged by a Board-Certified Interventional Pain Specialist with extensive familiarity and expertise in the same exact procedure and technique.

## 2023-12-23 NOTE — Telephone Encounter (Signed)
Miranda Fleming dropped off an envelope of forms that needed to be filled out from [employer]. I reviewed the papers with Dr. Katrinka Blazing and he states he is not able to fill them out, they will need to be filled out at her PCP. I did print off her last office note and attached put them with the papers. I left a voicemail informing her of this and to pick up the papers, they are at the front desk.

## 2023-12-24 ENCOUNTER — Ambulatory Visit: Payer: Commercial Managed Care - PPO | Attending: Pain Medicine | Admitting: Pain Medicine

## 2023-12-24 ENCOUNTER — Ambulatory Visit
Admission: RE | Admit: 2023-12-24 | Discharge: 2023-12-24 | Disposition: A | Discharge status: Home / Self Care | Payer: Commercial Managed Care - PPO | Source: Ambulatory Visit | Attending: Pain Medicine | Admitting: Pain Medicine

## 2023-12-24 VITALS — BP 120/72 | HR 76 | Temp 98.0°F | Resp 16 | Ht 65.0 in | Wt 112.0 lb

## 2023-12-24 DIAGNOSIS — S22038S Other fracture of third thoracic vertebra, sequela: Secondary | ICD-10-CM | POA: Diagnosis not present

## 2023-12-24 DIAGNOSIS — S22048S Other fracture of fourth thoracic vertebra, sequela: Secondary | ICD-10-CM | POA: Insufficient documentation

## 2023-12-24 DIAGNOSIS — Z91040 Latex allergy status: Secondary | ICD-10-CM | POA: Insufficient documentation

## 2023-12-24 DIAGNOSIS — M546 Pain in thoracic spine: Secondary | ICD-10-CM | POA: Diagnosis not present

## 2023-12-24 DIAGNOSIS — S22028S Other fracture of second thoracic vertebra, sequela: Secondary | ICD-10-CM | POA: Diagnosis not present

## 2023-12-24 DIAGNOSIS — S22018S Other fracture of first thoracic vertebra, sequela: Secondary | ICD-10-CM | POA: Insufficient documentation

## 2023-12-24 DIAGNOSIS — G8929 Other chronic pain: Secondary | ICD-10-CM | POA: Diagnosis not present

## 2023-12-24 DIAGNOSIS — X58XXXS Exposure to other specified factors, sequela: Secondary | ICD-10-CM | POA: Insufficient documentation

## 2023-12-24 DIAGNOSIS — S22000S Wedge compression fracture of unspecified thoracic vertebra, sequela: Secondary | ICD-10-CM

## 2023-12-24 MED ORDER — IOHEXOL 180 MG/ML  SOLN
INTRAMUSCULAR | Status: AC
  Filled 2023-12-24: qty 20

## 2023-12-24 MED ORDER — SODIUM CHLORIDE 0.9% FLUSH
2.0000 mL | Freq: Once | INTRAVENOUS | Status: AC
Start: 2023-12-24 — End: 2023-12-24
  Administered 2023-12-24: 2 mL

## 2023-12-24 MED ORDER — LIDOCAINE HCL 2 % IJ SOLN
INTRAMUSCULAR | Status: AC
  Filled 2023-12-24: qty 20

## 2023-12-24 MED ORDER — MIDAZOLAM HCL 2 MG/2ML IJ SOLN
INTRAMUSCULAR | Status: AC
  Filled 2023-12-24: qty 2

## 2023-12-24 MED ORDER — IOHEXOL 180 MG/ML  SOLN
10.0000 mL | Freq: Once | INTRAMUSCULAR | Status: AC
Start: 2023-12-24 — End: 2023-12-24
  Administered 2023-12-24: 10 mL via EPIDURAL

## 2023-12-24 MED ORDER — LIDOCAINE HCL 2 % IJ SOLN
20.0000 mL | Freq: Once | INTRAMUSCULAR | Status: AC
Start: 2023-12-24 — End: 2023-12-24
  Administered 2023-12-24: 400 mg

## 2023-12-24 MED ORDER — ROPIVACAINE HCL 2 MG/ML IJ SOLN
2.0000 mL | Freq: Once | INTRAMUSCULAR | Status: AC
Start: 2023-12-24 — End: 2023-12-24
  Administered 2023-12-24: 2 mL via EPIDURAL

## 2023-12-24 MED ORDER — DEXAMETHASONE SODIUM PHOSPHATE 10 MG/ML IJ SOLN
INTRAMUSCULAR | Status: AC
  Filled 2023-12-24: qty 1

## 2023-12-24 MED ORDER — PENTAFLUOROPROP-TETRAFLUOROETH EX AERO
INHALATION_SPRAY | Freq: Once | CUTANEOUS | Status: AC
  Administered 2023-12-24: 30 via TOPICAL

## 2023-12-24 MED ORDER — ROPIVACAINE HCL 2 MG/ML IJ SOLN
INTRAMUSCULAR | Status: AC
  Filled 2023-12-24: qty 20

## 2023-12-24 MED ORDER — DEXAMETHASONE SODIUM PHOSPHATE 10 MG/ML IJ SOLN
10.0000 mg | Freq: Once | INTRAMUSCULAR | Status: AC
  Administered 2023-12-24: 10 mg

## 2023-12-24 MED ORDER — SODIUM CHLORIDE (PF) 0.9 % IJ SOLN
INTRAMUSCULAR | Status: AC
  Filled 2023-12-24: qty 10

## 2023-12-24 MED ORDER — MIDAZOLAM HCL 2 MG/2ML IJ SOLN
0.5000 mg | Freq: Once | INTRAMUSCULAR | Status: AC
  Administered 2023-12-24: 2 mg via INTRAVENOUS

## 2023-12-25 ENCOUNTER — Telehealth: Payer: Self-pay | Admitting: *Deleted

## 2023-12-25 NOTE — Telephone Encounter (Signed)
Post procedure: patient states she was doing well yesterday but woke up with a headache this morning, first with nausea and then the onset of pain.  I did ask her if she was eating and drinking, yes she is.  States she had taken some ibuprofen this a.m. and has drank a lot of water.  I did ask if she laid completely flat if it was relieved, not necessarily.  We did discuss headache following epidural and why and remedies of lying flat and forcing fluids.  We both agreed that we did not feel this was the problem.  She will eat breakfast and continue to monitor.  Will reach back out if it gets worse or no improvement.

## 2024-01-09 ENCOUNTER — Observation Stay
Admission: EM | Admit: 2024-01-09 | Discharge: 2024-01-10 | Disposition: A | Discharge status: Home / Self Care | Attending: Obstetrics and Gynecology | Admitting: Obstetrics and Gynecology

## 2024-01-09 ENCOUNTER — Emergency Department

## 2024-01-09 ENCOUNTER — Other Ambulatory Visit: Payer: Self-pay

## 2024-01-09 ENCOUNTER — Encounter: Payer: Self-pay | Admitting: Emergency Medicine

## 2024-01-09 DIAGNOSIS — F10939 Alcohol use, unspecified with withdrawal, unspecified: Secondary | ICD-10-CM | POA: Diagnosis not present

## 2024-01-09 DIAGNOSIS — Z87891 Personal history of nicotine dependence: Secondary | ICD-10-CM | POA: Insufficient documentation

## 2024-01-09 DIAGNOSIS — Z91040 Latex allergy status: Secondary | ICD-10-CM | POA: Diagnosis not present

## 2024-01-09 DIAGNOSIS — W19XXXA Unspecified fall, initial encounter: Secondary | ICD-10-CM | POA: Insufficient documentation

## 2024-01-09 DIAGNOSIS — F22 Delusional disorders: Secondary | ICD-10-CM

## 2024-01-09 DIAGNOSIS — R112 Nausea with vomiting, unspecified: Secondary | ICD-10-CM | POA: Diagnosis present

## 2024-01-09 DIAGNOSIS — Z79899 Other long term (current) drug therapy: Secondary | ICD-10-CM | POA: Diagnosis not present

## 2024-01-09 DIAGNOSIS — R410 Disorientation, unspecified: Secondary | ICD-10-CM

## 2024-01-09 DIAGNOSIS — F19921 Other psychoactive substance use, unspecified with intoxication with delirium: Secondary | ICD-10-CM | POA: Diagnosis not present

## 2024-01-09 DIAGNOSIS — Z86718 Personal history of other venous thrombosis and embolism: Secondary | ICD-10-CM

## 2024-01-09 DIAGNOSIS — G9340 Encephalopathy, unspecified: Secondary | ICD-10-CM | POA: Diagnosis not present

## 2024-01-09 DIAGNOSIS — F10239 Alcohol dependence with withdrawal, unspecified: Secondary | ICD-10-CM | POA: Diagnosis not present

## 2024-01-09 DIAGNOSIS — S22008A Other fracture of unspecified thoracic vertebra, initial encounter for closed fracture: Secondary | ICD-10-CM | POA: Insufficient documentation

## 2024-01-09 DIAGNOSIS — S22000A Wedge compression fracture of unspecified thoracic vertebra, initial encounter for closed fracture: Secondary | ICD-10-CM | POA: Diagnosis present

## 2024-01-09 DIAGNOSIS — G894 Chronic pain syndrome: Secondary | ICD-10-CM | POA: Diagnosis present

## 2024-01-09 DIAGNOSIS — M4854XS Collapsed vertebra, not elsewhere classified, thoracic region, sequela of fracture: Secondary | ICD-10-CM | POA: Diagnosis present

## 2024-01-09 DIAGNOSIS — Z1152 Encounter for screening for COVID-19: Secondary | ICD-10-CM | POA: Insufficient documentation

## 2024-01-09 DIAGNOSIS — F10930 Alcohol use, unspecified with withdrawal, uncomplicated: Principal | ICD-10-CM

## 2024-01-09 DIAGNOSIS — R443 Hallucinations, unspecified: Secondary | ICD-10-CM | POA: Diagnosis not present

## 2024-01-09 DIAGNOSIS — T50905A Adverse effect of unspecified drugs, medicaments and biological substances, initial encounter: Secondary | ICD-10-CM

## 2024-01-09 LAB — URINE DRUG SCREEN, QUALITATIVE (ARMC ONLY)
Amphetamines, Ur Screen: NOT DETECTED
Barbiturates, Ur Screen: POSITIVE — AB
Benzodiazepine, Ur Scrn: NOT DETECTED
Cannabinoid 50 Ng, Ur ~~LOC~~: NOT DETECTED
Cocaine Metabolite,Ur ~~LOC~~: NOT DETECTED
MDMA (Ecstasy)Ur Screen: NOT DETECTED
Methadone Scn, Ur: NOT DETECTED
Opiate, Ur Screen: NOT DETECTED
Phencyclidine (PCP) Ur S: NOT DETECTED
Tricyclic, Ur Screen: POSITIVE — AB

## 2024-01-09 LAB — URINALYSIS, W/ REFLEX TO CULTURE (INFECTION SUSPECTED)
Bilirubin Urine: NEGATIVE
Glucose, UA: NEGATIVE mg/dL
Hgb urine dipstick: NEGATIVE
Ketones, ur: 20 mg/dL — AB
Leukocytes,Ua: NEGATIVE
Nitrite: NEGATIVE
Protein, ur: NEGATIVE mg/dL
Specific Gravity, Urine: 1.011 (ref 1.005–1.030)
pH: 5 (ref 5.0–8.0)

## 2024-01-09 LAB — CBC WITH DIFFERENTIAL/PLATELET
Abs Immature Granulocytes: 0.07 10*3/uL (ref 0.00–0.07)
Basophils Absolute: 0 10*3/uL (ref 0.0–0.1)
Basophils Relative: 0 %
Eosinophils Absolute: 0 10*3/uL (ref 0.0–0.5)
Eosinophils Relative: 0 %
HCT: 45.1 % (ref 36.0–46.0)
Hemoglobin: 16 g/dL — ABNORMAL HIGH (ref 12.0–15.0)
Immature Granulocytes: 1 %
Lymphocytes Relative: 14 %
Lymphs Abs: 1.6 10*3/uL (ref 0.7–4.0)
MCH: 35.9 pg — ABNORMAL HIGH (ref 26.0–34.0)
MCHC: 35.5 g/dL (ref 30.0–36.0)
MCV: 101.1 fL — ABNORMAL HIGH (ref 80.0–100.0)
Monocytes Absolute: 0.7 10*3/uL (ref 0.1–1.0)
Monocytes Relative: 6 %
Neutro Abs: 9.2 10*3/uL — ABNORMAL HIGH (ref 1.7–7.7)
Neutrophils Relative %: 79 %
Platelets: 241 10*3/uL (ref 150–400)
RBC: 4.46 MIL/uL (ref 3.87–5.11)
RDW: 11.8 % (ref 11.5–15.5)
WBC: 11.6 10*3/uL — ABNORMAL HIGH (ref 4.0–10.5)
nRBC: 0 % (ref 0.0–0.2)

## 2024-01-09 LAB — LACTIC ACID, PLASMA
Lactic Acid, Venous: 1.4 mmol/L (ref 0.5–1.9)
Lactic Acid, Venous: 1.4 mmol/L (ref 0.5–1.9)

## 2024-01-09 LAB — COMPREHENSIVE METABOLIC PANEL
ALT: 39 U/L (ref 0–44)
AST: 35 U/L (ref 15–41)
Albumin: 5.2 g/dL — ABNORMAL HIGH (ref 3.5–5.0)
Alkaline Phosphatase: 64 U/L (ref 38–126)
Anion gap: 16 — ABNORMAL HIGH (ref 5–15)
BUN: 15 mg/dL (ref 6–20)
CO2: 20 mmol/L — ABNORMAL LOW (ref 22–32)
Calcium: 10.3 mg/dL (ref 8.9–10.3)
Chloride: 100 mmol/L (ref 98–111)
Creatinine, Ser: 0.7 mg/dL (ref 0.44–1.00)
GFR, Estimated: >60 mL/min (ref >60)
Glucose, Bld: 81 mg/dL (ref 70–99)
Potassium: 3.2 mmol/L — ABNORMAL LOW (ref 3.5–5.1)
Sodium: 136 mmol/L (ref 135–145)
Total Bilirubin: 1.7 mg/dL — ABNORMAL HIGH (ref 0.0–1.2)
Total Protein: 8.7 g/dL — ABNORMAL HIGH (ref 6.5–8.1)

## 2024-01-09 LAB — HIV ANTIBODY (ROUTINE TESTING W REFLEX): HIV Screen 4th Generation wRfx: NONREACTIVE

## 2024-01-09 LAB — SALICYLATE LEVEL: Salicylate Lvl: <7 mg/dL — ABNORMAL LOW (ref 7.0–30.0)

## 2024-01-09 LAB — ACETAMINOPHEN LEVEL: Acetaminophen (Tylenol), Serum: <10 ug/mL — ABNORMAL LOW (ref 10–30)

## 2024-01-09 LAB — RESP PANEL BY RT-PCR (RSV, FLU A&B, COVID)  RVPGX2
Influenza A by PCR: NEGATIVE
Influenza B by PCR: NEGATIVE
Resp Syncytial Virus by PCR: NEGATIVE
SARS Coronavirus 2 by RT PCR: NEGATIVE

## 2024-01-09 LAB — ETHANOL: Alcohol, Ethyl (B): <10 mg/dL (ref <10)

## 2024-01-09 LAB — VITAMIN B12: Vitamin B-12: 824 pg/mL (ref 180–914)

## 2024-01-09 LAB — FOLATE: Folate: >40 ng/mL (ref >5.9)

## 2024-01-09 LAB — TSH: TSH: 2.45 u[IU]/mL (ref 0.350–4.500)

## 2024-01-09 LAB — AMMONIA: Ammonia: <10 umol/L (ref 9–35)

## 2024-01-09 LAB — TROPONIN I (HIGH SENSITIVITY): Troponin I (High Sensitivity): 3 ng/L (ref <18)

## 2024-01-09 MED ORDER — PHENOBARBITAL SODIUM 130 MG/ML IJ SOLN: 97.5000 mg | Freq: Three times a day (TID) | INTRAMUSCULAR | Status: DC

## 2024-01-09 MED ORDER — PHENOBARBITAL SODIUM 65 MG/ML IJ SOLN: 32.5000 mg | Freq: Three times a day (TID) | INTRAMUSCULAR | Status: DC

## 2024-01-09 MED ORDER — ADULT MULTIVITAMIN W/MINERALS CH
1.0000 | ORAL_TABLET | Freq: Every day | ORAL | Status: DC
  Administered 2024-01-09 – 2024-01-10 (×2): 1 via ORAL
  Filled 2024-01-09 (×2): qty 1

## 2024-01-09 MED ORDER — LACTATED RINGERS IV BOLUS
1000.0000 mL | Freq: Once | INTRAVENOUS | Status: AC
  Administered 2024-01-09: 1000 mL via INTRAVENOUS

## 2024-01-09 MED ORDER — PHENOBARBITAL SODIUM 65 MG/ML IJ SOLN: 65.0000 mg | Freq: Three times a day (TID) | INTRAMUSCULAR | Status: DC

## 2024-01-09 MED ORDER — PHENOBARBITAL SODIUM 130 MG/ML IJ SOLN
130.0000 mg | Freq: Once | INTRAMUSCULAR | Status: AC
  Administered 2024-01-09: 130 mg via INTRAVENOUS
  Filled 2024-01-09: qty 1

## 2024-01-09 MED ORDER — PHENOBARBITAL SODIUM 130 MG/ML IJ SOLN
97.5000 mg | Freq: Three times a day (TID) | INTRAMUSCULAR | Status: DC
  Administered 2024-01-09 – 2024-01-10 (×3): 97.5 mg via INTRAVENOUS
  Filled 2024-01-09 (×3): qty 1

## 2024-01-09 MED ORDER — LORAZEPAM 2 MG/ML IJ SOLN
1.0000 mg | INTRAMUSCULAR | Status: DC | PRN
  Administered 2024-01-09 (×3): 3 mg via INTRAVENOUS
  Administered 2024-01-09 – 2024-01-10 (×2): 2 mg via INTRAVENOUS
  Filled 2024-01-09 (×2): qty 1
  Filled 2024-01-09 (×3): qty 2

## 2024-01-09 MED ORDER — ONDANSETRON HCL 4 MG/2ML IJ SOLN: 4.0000 mg | Freq: Four times a day (QID) | INTRAMUSCULAR | Status: DC | PRN

## 2024-01-09 MED ORDER — ACETAMINOPHEN 325 MG PO TABS
650.0000 mg | ORAL_TABLET | Freq: Once | ORAL | Status: AC
  Administered 2024-01-09: 650 mg via ORAL
  Filled 2024-01-09: qty 2

## 2024-01-09 MED ORDER — THIAMINE HCL 100 MG/ML IJ SOLN
100.0000 mg | Freq: Once | INTRAMUSCULAR | Status: AC
  Administered 2024-01-09: 100 mg via INTRAVENOUS
  Filled 2024-01-09: qty 2

## 2024-01-09 MED ORDER — LORAZEPAM 1 MG PO TABS
1.0000 mg | ORAL_TABLET | ORAL | Status: DC | PRN
  Administered 2024-01-09 (×2): 1 mg via ORAL
  Filled 2024-01-09 (×2): qty 1

## 2024-01-09 MED ORDER — ONDANSETRON HCL 4 MG PO TABS: 4.0000 mg | ORAL_TABLET | Freq: Four times a day (QID) | ORAL | Status: DC | PRN

## 2024-01-09 MED ORDER — ENOXAPARIN SODIUM 40 MG/0.4ML IJ SOSY
40.0000 mg | PREFILLED_SYRINGE | INTRAMUSCULAR | Status: DC
  Administered 2024-01-09: 40 mg via SUBCUTANEOUS
  Filled 2024-01-09: qty 0.4

## 2024-01-09 MED ORDER — THIAMINE HCL 100 MG/ML IJ SOLN
100.0000 mg | Freq: Every day | INTRAMUSCULAR | Status: DC
Start: 2024-01-09 — End: 2024-01-10
  Filled 2024-01-09: qty 2

## 2024-01-09 MED ORDER — THIAMINE MONONITRATE 100 MG PO TABS
100.0000 mg | ORAL_TABLET | Freq: Every day | ORAL | Status: DC
Start: 2024-01-09 — End: 2024-01-10
  Administered 2024-01-09 – 2024-01-10 (×2): 100 mg via ORAL
  Filled 2024-01-09 (×2): qty 1

## 2024-01-09 MED ORDER — FOLIC ACID 1 MG PO TABS
1.0000 mg | ORAL_TABLET | Freq: Every day | ORAL | Status: DC
  Administered 2024-01-09 – 2024-01-10 (×2): 1 mg via ORAL
  Filled 2024-01-09 (×2): qty 1

## 2024-01-09 MED ORDER — SODIUM CHLORIDE 0.9 % IV SOLN: INTRAVENOUS | Status: AC

## 2024-01-09 NOTE — Assessment & Plan Note (Addendum)
 Encephalopathy Positive generalized agitation tremors and confusion in the setting of chronic alcohol use-concern for early DTs Regular 1 bottle of wine use daily up until roughly 1 to 2 weeks ago Ethanol level within normal limits Significant agitation on presentation with generalized tremor Plan start on phenobarbital taper with CIWA protocol Checking thiamine, folate levels with concern for component of wernicke  Vitamin supplementation  Noted active hallucinations including patient stating that Miranda Fleming had breakfast with family this morning CT head within normal limits Ammonia level pending No overt infectious etiology noted Suspect this is likely contributory to alcohol withdrawal.  However, I suspect he may be a subacute psychiatric component Will formally consult psychiatry for formal evaluation Monitor

## 2024-01-09 NOTE — TOC CM/SW Note (Signed)
 TOC received consult for [facility] resources. Resources added to the AVS.  Alfonso Ramus, LCSW Transitions of Care Department [phone]

## 2024-01-09 NOTE — H&P (Addendum)
 History and Physical    Patient: Miranda Fleming GNF:[id number] DOB: Oct 20, 1966 DOA: 01/09/2024 DOS: the patient was seen and examined on 01/09/2024 PCP: Pcp, No  Patient coming from: Home  Chief Complaint:  Chief Complaint  Patient presents with   Headache   Fever   Hallucinations   HPI: Miranda Fleming is a 58 y.o. female with medical history significant of no significant prior medical history presenting with encephalopathy alcohol withdrawal.  Per report, patient with headache as well hallucinations past 1 to 2 days.?  Fever as well.  Patient ports having generalized GI symptoms including diarrhea over similar timeframe.  No reported sick contacts.  No reported change in diet.  Patient does admit to drinking roughly 1 bottle of wine daily and has been decreased to roughly 1/2 a bottle for 1 to 2 weeks.  Denies any liquor or beer use.  No reported illicit drug use.  Has had worsening agitation over the past few days.  Also with hallucinations including patient reporting that abdominal trunk had breakfast within this morning.  Denies any prior history of psychiatric issues in the past. No reported HI/SI.  Presented to the ER afebrile, hemodynamically stable.  Satting well on room air.  Labs including CBC, CMP as well as alcohol level within normal limits.  CT head stable.  Chest x-ray stable.  Requiring phenobarbital to help with agitation. Review of Systems: As mentioned in the history of present illness. All other systems reviewed and are negative. Past Medical History:  Diagnosis Date   DVT, lower extremity, distal, acute (HCC)    paired right peroneal veins; 2 weeks after back surgery   Palpitations 11/23/2015   SUI (stress urinary incontinence, female)    Wears glasses    Past Surgical History:  Procedure Laterality Date   ABDOMINAL HYSTERECTOMY  1995   W/ RIGHT SALPINGOOPHORECTOMY   EXPLORATORY LAPAROTOMY W/  LEFT SALPINGOOPHORECTOMY  01-25-2001   LAPAROSCOPY'S AND  LAPAROTOMY'S FOR PERSISTANT OVARIAN CYST  X 5  1987;  1989;  1990;  1993;  1994   PUBOVAGINAL SLING N/A 10/31/2013   Procedure: LYNX PUBO-VAGINAL SLING;  Surgeon: Kathi Ludwig, MD;  Location: [facility];  Service: Urology;  Laterality: N/A;   Social History:  reports that she quit smoking about 2 years ago. Her smoking use included cigarettes. She started smoking about 43 years ago. She has a 61.6 pack-year smoking history. She has never used smokeless tobacco. She reports current alcohol use of about 4.0 standard drinks of alcohol per week. No history on file for drug use.  Allergies  Allergen Reactions   Amoxicillin Rash   Latex Rash    Family History  Problem Relation Age of Onset   Ovarian cancer Mother    Osteoporosis Mother    Breast cancer Mother        Age 44   Diabetes Maternal Aunt    Ovarian cancer Maternal Grandmother    Colon cancer Paternal Grandfather    Healthy Father    Osteopenia Sister    Healthy Brother     Prior to Admission medications   Medication Sig Start Date End Date Taking? Authorizing Provider  ALPRAZolam Prudy Feeler) 1 MG tablet Take 1 mg by mouth every 8 (eight) hours as needed. 04/21/21   [provider]  calcium carbonate (OS-CAL - DOSED IN MG OF ELEMENTAL CALCIUM) 1250 (500 Ca) MG tablet Take 1 tablet by mouth daily with breakfast.    [provider]  cyanocobalamin (VITAMIN  B12) 500 MCG tablet Take 500 mcg by mouth daily. 06/24/17   [provider]  estradiol (ESTRACE) 0.5 MG tablet Take 0.5 mg by mouth daily.    [provider]  Multiple Minerals-Vitamins (BONE ESSENTIALS) CAPS Take by mouth. 06/24/17   [provider]  Multiple Vitamin (MULTIVITAMIN) tablet Take 1 tablet by mouth daily.    [provider]  Omega-3 Fatty Acids (FISH OIL) 1000 MG CAPS Take 1 capsule by mouth daily.    [provider]  traMADol (ULTRAM) 50 MG tablet Take 50 mg by mouth every 6 (six)  hours as needed. 05/14/21   [provider]    Physical Exam: Vitals:   01/09/24 0745 01/09/24 0800 01/09/24 0840 01/09/24 0907  BP: 119/67 120/66 (!) 121/58 (!) 121/58  Pulse: 76 80 75 66  Resp: 15 15 18    Temp:      TempSrc:      SpO2: 99% 99% 100%   Weight:      Height:       Physical Exam Constitutional:      Appearance: She is normal weight.     Comments: + generalized agitation  HENT:     Head: Normocephalic and atraumatic.     Mouth/Throat:     Mouth: Mucous membranes are dry.  Eyes:     Pupils: Pupils are equal, round, and reactive to light.  Cardiovascular:     Rate and Rhythm: Normal rate and regular rhythm.  Pulmonary:     Effort: Pulmonary effort is normal.  Abdominal:     General: Bowel sounds are normal.  Musculoskeletal:        General: Normal range of motion.  Neurological:     Comments: + generalized tremor and agitation  Otherwise grossly stable neuro exam    Psychiatric:     Comments: + agitation      Data Reviewed:  There are no new results to review at this time.  DG Chest 2 View CLINICAL DATA:  Fever  EXAM: CHEST - 2 VIEW  COMPARISON:  06/24/2022  FINDINGS: Remote left rib fractures with healed deformity. Mild linear scarring in the right lower lung when compared to prior. There is no edema, consolidation, effusion, or pneumothorax. Normal heart size and mediastinal contours.  IMPRESSION: No active cardiopulmonary disease.  Electronically Signed   By: Tiburcio Pea M.D.   On: 01/09/2024 07:46 CT Head Wo Contrast CLINICAL DATA:  Mental status change with unknown cause. Fever and headache for 3 days with hallucinations.  EXAM: CT HEAD WITHOUT CONTRAST  TECHNIQUE: Contiguous axial images were obtained from the base of the skull through the vertex without intravenous contrast.  RADIATION DOSE REDUCTION: This exam was performed according to the departmental dose-optimization program which includes  automated exposure control, adjustment of the mA and/or kV according to patient size and/or use of iterative reconstruction technique.  COMPARISON:  05/24/2021  FINDINGS: Brain: No evidence of acute infarction, hemorrhage, hydrocephalus, or mass. 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 No change since prior.  Vascular: No hyperdense vessel or unexpected calcification.  Skull: Normal. Negative for fracture or focal lesion.  Sinuses/Orbits: No acute finding.  IMPRESSION: No acute or interval finding.  Electronically Signed   By: Tiburcio Pea M.D.   On: 01/09/2024 07:45  Lab Results  Component Value Date   WBC 11.6 (H) 01/09/2024   HGB  16.0 (H) 01/09/2024   HCT 45.1 01/09/2024   MCV 101.1 (H) 01/09/2024   PLT 241 01/09/2024   Last metabolic panel Lab Results  Component Value Date   GLUCOSE 81 01/09/2024   NA 136 01/09/2024   K 3.2 (L) 01/09/2024   CL 100 01/09/2024   CO2 20 (L) 01/09/2024   BUN 15 01/09/2024   CREATININE 0.70 01/09/2024   GFRNONAA >60 01/09/2024   CALCIUM 10.3 01/09/2024   PROT 8.7 (H) 01/09/2024   ALBUMIN 5.2 (H) 01/09/2024   LABGLOB 2.1 12/07/2023   BILITOT 1.7 (H) 01/09/2024   ALKPHOS 64 01/09/2024   AST 35 01/09/2024   ALT 39 01/09/2024   ANIONGAP 16 (H) 01/09/2024    Assessment and Plan: * Alcohol withdrawal (HCC) Encephalopathy Positive generalized agitation tremors and confusion in the setting of chronic alcohol use-concern for early DTs Regular 1 bottle of wine use daily up until roughly 1 to 2 weeks ago Ethanol level within normal limits Significant agitation on presentation with generalized tremor Plan start on phenobarbital taper with CIWA protocol Checking thiamine, folate levels with concern for component of wernicke  Vitamin supplementation  Noted active hallucinations including patient  stating that Garnet Koyanagi had breakfast with family this morning CT head within normal limits Ammonia level pending No overt infectious etiology noted Suspect this is likely contributory to alcohol withdrawal.  However, I suspect he may be a subacute psychiatric component Will formally consult psychiatry for formal evaluation Monitor      Advance Care Planning:   Code Status: Full Code   Consults: None   Family Communication: Husband at the bedside   Severity of Illness: The appropriate patient status for this patient is OBSERVATION. Observation status is judged to be reasonable and necessary in order to provide the required intensity of service to ensure the patient's safety. The patient's presenting symptoms, physical exam findings, and initial radiographic and laboratory data in the context of their medical condition is felt to place them at decreased risk for further clinical deterioration. Furthermore, it is anticipated that the patient will be medically stable for discharge from the hospital within 2 midnights of admission.   Author: Floydene Flock, MD 01/09/2024 10:13 AM  For on call review [email or web address].

## 2024-01-09 NOTE — ED Notes (Addendum)
 Patient stated there was a man behind her bed wearing blue. RN explained there was not a man behind her it was the blue handles on the bed. Patient AOX2 to situation and name. Patient was unable to provide accurate birth date.

## 2024-01-09 NOTE — ED Notes (Signed)
 EDP in room with patient and family member at bedside. Patient stated that she and her family member had breakfast with the president this morning. The family member states that the patient stated last night that they were going to eat with the president. Patient responded with "This is true we did eat with him". Patient is alert to her name, situation, and hospital.

## 2024-01-09 NOTE — Consult Note (Signed)
  Attempted to complete psychiatric evaluation. Patient received IM medication and is asleep. Will assess when patient is alert.

## 2024-01-09 NOTE — ED Triage Notes (Signed)
 Patient arrives to ED pov from home states she has been running fever and headache for three days, husband states she was hallucinating last night,

## 2024-01-09 NOTE — ED Notes (Addendum)
 Patient found out of bed wondering about the room. Patient was asking where her family was and talking to people in the room who were not there. Patient was put back into bed and family member returned.

## 2024-01-09 NOTE — ED Notes (Signed)
 Provided patient with warm blanket. Repositioned bed, family member at bedside.

## 2024-01-09 NOTE — ED Notes (Signed)
 Patient took off O2 monitoring device. Patient stated "It burned off". RN looked for any burns or fire. Patients finger was C,D,I. No burns were noted. RN readjusted patients O2 device.

## 2024-01-09 NOTE — ED Notes (Signed)
 Patient on the toilet providing UA sample now.

## 2024-01-09 NOTE — ED Notes (Addendum)
 Patient was transferred to X-ray and CT by X-ray tech.

## 2024-01-09 NOTE — ED Notes (Signed)
 Patient in room not looking at RN and yelling for grandchildren that aren't there. Agitated pulling at sheets and IV.

## 2024-01-09 NOTE — ED Notes (Signed)
 RN was answering call bell. Patient had pulled off her oxygen monitoring from her finger. Patient stated "I had the Asprin in my hand and it melted in between my fingers and this had to come off". Patient did not receive Asprin from hospital staff. RN applied new oxygen monitoring device. Patient agreed to keep it on her finger.

## 2024-01-09 NOTE — Consult Note (Addendum)
  An attempt was made to see this patient today, to evaluate for hallucinations.  Patient's husband was present at bedside.  Patient was not able to participate in the interview.  She was sedated probably due to phenobarbital.  She was observed few times picking up in the air while sleeping and drowsy.  Per husband, the patient does not have any past history of psychiatric treatment.  Per chart review  "Miranda Fleming is a 58 y.o. female with medical history significant of no significant prior medical history presenting with encephalopathy alcohol withdrawal.  Per report, patient with headache as well hallucinations past 1 to 2 days.?  Fever as well.  Patient ports having generalized GI symptoms including diarrhea over similar timeframe.  No reported sick contacts.  No reported change in diet.  Patient does admit to drinking roughly 1 bottle of wine daily and has been decreased to roughly 1/2 a bottle for 1 to 2 weeks "  Impression and recommendations:  At this point in time patient is not able to participate in the assessment.  Per chart review, and patient's husband's information, patient probably going through delirium tremens secondary to withdrawals.  Will recommend Continue to detox patient using phenobarbital Will recommend use of Haldol 2 to 5 mg every 6-8 hours as needed for agitation and hallucinations.  Will continue to follow the patient on medical floor as needed.  D/w Dr. Alvester Morin

## 2024-01-09 NOTE — ED Notes (Signed)
 Patient asleep in bed. Patient alert to pain stimulus.

## 2024-01-09 NOTE — ED Notes (Signed)
 Patient able to tolerate PO pills with PO fluids.

## 2024-01-09 NOTE — ED Notes (Addendum)
 Called Lab to check on Ethanol level. Processing now.

## 2024-01-09 NOTE — ED Notes (Addendum)
 Patients family member came back into room. Family updated on situation.

## 2024-01-09 NOTE — ED Provider Notes (Signed)
 [facility] Provider Note    Event Date/Time   First MD Initiated Contact with Patient 01/09/24 0701     (approximate)   History   Headache, Fever, and Hallucinations   HPI  Miranda Fleming is a 58 y.o. female with history of chronic pain syndrome, chronic alcohol use, prior remote history of DVT after spine surgery, not on anticoagulation, neuropathy, anxiety, presenting with nausea, vomiting, diarrhea.  States that symptoms started several days ago.  Diarrhea has improved but she has been unable to tolerate p.o. given the nausea vomiting.  She denies any abdominal pain, fevers, chest pain, back pain, shortness of breath, leg swelling, urinary symptoms.  No neck pain or neck stiffness.  States that she does have a frontal headache that started on Wednesday. Is not thunderclap, not worst headache of her life.  Per husband patient drinks daily, has had tremors in the past when she stops drinking.  Thinks that she might of had a withdrawal seizure in the past as well.  States that patient had tried to cut down on drinking for last 2 weeks.  Patient states that her last drink was yesterday night.  Husband noted that for the last day, patient had been more hyper than usual, awake, not really sleeping, that she made the bed 5 times.  This morning she thought that President Trump was coming to see them.  Patient states that he did come over and they met him for breakfast.  Husband states no known history of STIs.  No history of psychiatric issues or bipolar disorder, schizophrenia or dementia.  States that patient's mom does have history of dementia.  Independent history obtained from husband.  On independent chart review she was seen by her pain medicine doctor at the end of January.  Does a history of neuropathy and migraines, also history of chronic pain syndrome.  She had an epidural steroid injection done on February 13.     Physical Exam   Triage Vital  Signs: ED Triage Vitals  Encounter Vitals Group     BP 01/09/24 0649 (!) 158/110     Systolic BP Percentile --      Diastolic BP Percentile --      Pulse Rate 01/09/24 0649 96     Resp 01/09/24 0649 18     Temp 01/09/24 0649 98.3 F (36.8 C)     Temp Source 01/09/24 0649 Oral     SpO2 01/09/24 0649 100 %     Weight 01/09/24 0651 113 lb (51.3 kg)     Height 01/09/24 0651 5\' 5"  (1.651 m)     Head Circumference --      Peak Flow --      Pain Score 01/09/24 0650 9     Pain Loc --      Pain Education --      Exclude from Growth Chart --     Most recent vital signs: Vitals:   01/09/24 0840 01/09/24 0907  BP: (!) 121/58 (!) 121/58  Pulse: 75 66  Resp: 18   Temp:    SpO2: 100%      General: Awake, no distress.  Alert and oriented x 3 CV:  Good peripheral perfusion.  Resp:  Normal effort.  No increased work of breathing Abd:  No distention.  Soft nontender Other:  Pupils equal and reactive, extraocular movements are intact no cranial nerve deficits, no focal weakness or numbness.  Mucous membranes are dry.  She does  have bilateral upper extremity tremors with some mild tongue fasciculations.  No neck stiffness, full range of motion of her neck is intact.  No midline spinal tenderness, no step-offs, or swelling to your spine. No saddle anesthesia.   ED Results / Procedures / Treatments   Labs (all labs ordered are listed, but only abnormal results are displayed) Labs Reviewed  COMPREHENSIVE METABOLIC PANEL - Abnormal; Notable for the following components:      Result Value   Potassium 3.2 (*)    CO2 20 (*)    Total Protein 8.7 (*)    Albumin 5.2 (*)    Total Bilirubin 1.7 (*)    Anion gap 16 (*)    All other components within normal limits  CBC WITH DIFFERENTIAL/PLATELET - Abnormal; Notable for the following components:   WBC 11.6 (*)    Hemoglobin 16.0 (*)    MCV 101.1 (*)    MCH 35.9 (*)    Neutro Abs 9.2 (*)    All other components within normal limits   ACETAMINOPHEN LEVEL - Abnormal; Notable for the following components:   Acetaminophen (Tylenol), Serum <10 (*)    All other components within normal limits  SALICYLATE LEVEL - Abnormal; Notable for the following components:   Salicylate Lvl <7.0 (*)    All other components within normal limits  RESP PANEL BY RT-PCR (RSV, FLU A&B, COVID)  RVPGX2  CULTURE, BLOOD (ROUTINE X 2)  CULTURE, BLOOD (ROUTINE X 2)  LACTIC ACID, PLASMA  AMMONIA  ETHANOL  LACTIC ACID, PLASMA  URINALYSIS, W/ REFLEX TO CULTURE (INFECTION SUSPECTED)  RPR  HIV ANTIBODY (ROUTINE TESTING W REFLEX)  URINE DRUG SCREEN, QUALITATIVE ([facility] ONLY)  TSH  TROPONIN I (HIGH SENSITIVITY)     EKG  Sinus rhythm, rate of 78, normal QRS, normal QTc, T wave flattening in aVL, no ischemic ST elevation, T wave changes new compared to prior   RADIOLOGY CT head on my interpretation without intracranial hemorrhage, she does have a cysts posterior to her cerebellum.   PROCEDURES:  Critical Care performed: Yes, see critical care procedure note(s)  .Critical Care  Performed by: Claybon Jabs, MD Authorized by: Claybon Jabs, MD   Critical care provider statement:    Critical care time (minutes):  35   Critical care was time spent personally by me on the following activities:  Development of treatment plan with patient or surrogate, discussions with consultants, evaluation of patient's response to treatment, examination of patient, ordering and review of laboratory studies, ordering and review of radiographic studies, ordering and performing treatments and interventions, pulse oximetry, re-evaluation of patient's condition and review of old charts    MEDICATIONS ORDERED IN ED: Medications  LORazepam (ATIVAN) tablet 1-4 mg (has no administration in time range)    Or  LORazepam (ATIVAN) injection 1-4 mg (has no administration in time range)  thiamine (VITAMIN B1) tablet 100 mg (has no administration in time range)    Or   thiamine (VITAMIN B1) injection 100 mg (has no administration in time range)  folic acid (FOLVITE) tablet 1 mg (has no administration in time range)  multivitamin with minerals tablet 1 tablet (has no administration in time range)  lactated ringers bolus 1,000 mL (0 mLs Intravenous Stopped 01/09/24 0838)  PHENObarbital (LUMINAL) injection 130 mg (130 mg Intravenous Given 01/09/24 0745)  thiamine (VITAMIN B1) injection 100 mg (100 mg Intravenous Given 01/09/24 0745)  acetaminophen (TYLENOL) tablet 650 mg (650 mg Oral Given 01/09/24 0839)  PHENObarbital (LUMINAL) injection  130 mg (130 mg Intravenous Given 01/09/24 0905)     IMPRESSION / MDM / ASSESSMENT AND PLAN / ED COURSE  I reviewed the triage vital signs and the nursing notes.                              Differential diagnosis includes, but is not limited to, gastroenteritis, viral illness, no abdominal pain to warrant imaging at this time, dehydration, electrolyte derangements, alcohol withdrawal, DTs, new onset psych, bipolar, schizophrenia, early dementia, mass.  ICH, no focal neurodeficits to suggest CVA.  Hyperammonemia. she is not on any blood thinners, no trauma reported to suggest traumatic intracranial hemorrhage.  Will get CT head, labs, EKG, troponin, chest x-ray, blood cultures, RPR, HIV, UDS.  She will likely need to be admitted for further management.  Patient's presentation is most consistent with acute presentation with potential threat to life or bodily function.  Independent review of labs and imaging are below.  Given alcohol withdrawal requiring multiple doses of meds, will plan to have patient admitted for further management.  Consult to hospitalist who is agreeable with the plan for admission and will evaluate the patient.  She is admitted.  Clinical Course as of 01/09/24 0915  Sat Jan 09, 2024  0759 CT Head Wo Contrast IMPRESSION: No acute or interval finding. Brain: No evidence of acute infarction, hemorrhage,  hydrocephalus, or mass. 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 No change since prior.    [TT]  0800 DG Chest 2 View IMPRESSION: No active cardiopulmonary disease.   [TT]  1610 On reassessment patient still has some tremors and tongue fasciculations, will give her another dose of IV phenobarb and plan to have her admitted for alcohol withdrawal. [TT]  9604 Independent review of labs, potassium is mildly low but otherwise electrolytes are severely deranged, creatinine is normal, mild leukocytosis that is nonspecific, lactate not elevated, troponins not elevated, ammonia levels normal, salicylate and Tylenol level not elevated. [TT]    Clinical Course User Index [TT] Jodie Echevaria Franchot Erichsen, MD     FINAL CLINICAL IMPRESSION(S) / ED DIAGNOSES   Final diagnoses:  Alcohol withdrawal syndrome without complication (HCC)  Hallucination  Delusion (HCC)  Nausea and vomiting, unspecified vomiting type     Rx / DC Orders   ED Discharge Orders     None        Note:  This document was prepared using Dragon voice recognition software and may include unintentional dictation errors.    Claybon Jabs, MD 01/09/24 [phone]

## 2024-01-09 NOTE — ED Notes (Signed)
 Attempted to obtain UA sample. Patient provided insufficient amount of urine. RN instructed patient to continue bag of fluids to finish.

## 2024-01-10 DIAGNOSIS — F10939 Alcohol use, unspecified with withdrawal, unspecified: Secondary | ICD-10-CM | POA: Diagnosis not present

## 2024-01-10 DIAGNOSIS — Z86718 Personal history of other venous thrombosis and embolism: Secondary | ICD-10-CM

## 2024-01-10 DIAGNOSIS — F10239 Alcohol dependence with withdrawal, unspecified: Secondary | ICD-10-CM | POA: Diagnosis not present

## 2024-01-10 LAB — COMPREHENSIVE METABOLIC PANEL
ALT: 22 U/L (ref 0–44)
AST: 19 U/L (ref 15–41)
Albumin: 3.5 g/dL (ref 3.5–5.0)
Alkaline Phosphatase: 45 U/L (ref 38–126)
Anion gap: 6 (ref 5–15)
BUN: 10 mg/dL (ref 6–20)
CO2: 23 mmol/L (ref 22–32)
Calcium: 8.5 mg/dL — ABNORMAL LOW (ref 8.9–10.3)
Chloride: 106 mmol/L (ref 98–111)
Creatinine, Ser: 0.43 mg/dL — ABNORMAL LOW (ref 0.44–1.00)
GFR, Estimated: >60 mL/min (ref >60)
Glucose, Bld: 87 mg/dL (ref 70–99)
Potassium: 3.1 mmol/L — ABNORMAL LOW (ref 3.5–5.1)
Sodium: 135 mmol/L (ref 135–145)
Total Bilirubin: 0.9 mg/dL (ref 0.0–1.2)
Total Protein: 5.8 g/dL — ABNORMAL LOW (ref 6.5–8.1)

## 2024-01-10 LAB — CBC
HCT: 34.2 % — ABNORMAL LOW (ref 36.0–46.0)
Hemoglobin: 12.5 g/dL (ref 12.0–15.0)
MCH: 36.3 pg — ABNORMAL HIGH (ref 26.0–34.0)
MCHC: 36.5 g/dL — ABNORMAL HIGH (ref 30.0–36.0)
MCV: 99.4 fL (ref 80.0–100.0)
Platelets: 195 10*3/uL (ref 150–400)
RBC: 3.44 MIL/uL — ABNORMAL LOW (ref 3.87–5.11)
RDW: 11.5 % (ref 11.5–15.5)
WBC: 7 10*3/uL (ref 4.0–10.5)
nRBC: 0 % (ref 0.0–0.2)

## 2024-01-10 LAB — RPR: RPR Ser Ql: NONREACTIVE

## 2024-01-10 LAB — MAGNESIUM: Magnesium: 1.8 mg/dL (ref 1.7–2.4)

## 2024-01-10 MED ORDER — POTASSIUM CHLORIDE CRYS ER 20 MEQ PO TBCR
60.0000 meq | EXTENDED_RELEASE_TABLET | Freq: Once | ORAL | Status: AC
  Administered 2024-01-10: 60 meq via ORAL
  Filled 2024-01-10: qty 3

## 2024-01-10 MED ORDER — CHLORDIAZEPOXIDE HCL 25 MG PO CAPS: ORAL_CAPSULE | ORAL | 0 refills | Status: DC

## 2024-01-10 NOTE — Progress Notes (Signed)
 Mobility Specialist - Progress Note   01/10/24 1138  Mobility  Activity Ambulated with assistance in hallway;Stood at bedside;Dangled on edge of bed  Level of Assistance Standby assist, set-up cues, supervision of patient - no hands on  Assistive Device None  Distance Ambulated (ft) 1000 ft  Activity Response Tolerated well  Mobility Referral Yes  Mobility visit 1 Mobility  Mobility Specialist Start Time (ACUTE ONLY) 1118  Mobility Specialist Stop Time (ACUTE ONLY) 1135  Mobility Specialist Time Calculation (min) (ACUTE ONLY) 17 min   Pt sitting EOB on RA upon arrival. Pt STS and ambulates to/from [facility] supervision. Pt returns to EOB with needs in reach and husband present.   Terrilyn Saver  Mobility Specialist  01/10/24 11:39 AM

## 2024-01-10 NOTE — Discharge Summary (Signed)
 Miranda Fleming EAV:[id number] DOB: 1965/12/20 DOA: 01/09/2024  PCP: Pcp, No  Admit date: 01/09/2024 Discharge date: 01/10/2024  Time spent: 35 minutes  Recommendations for Outpatient Follow-up:  Establish with pcp and mental health provider Alcohol cessation     Discharge Diagnoses:  Principal Problem:   Alcohol withdrawal (HCC) Active Problems:   Thoracic compression fracture (HCC)   Chronic pain syndrome   Drug-induced delirium   History of DVT (deep vein thrombosis)   Discharge Condition: stable  Diet recommendation: regular  Filed Weights   01/09/24 0651  Weight: 51.3 kg    History of present illness:  From admission h and p Miranda Fleming is a 58 y.o. female with medical history significant of no significant prior medical history presenting with encephalopathy alcohol withdrawal.  Per report, patient with headache as well hallucinations past 1 to 2 days.?  Fever as well.  Patient ports having generalized GI symptoms including diarrhea over similar timeframe.  No reported sick contacts.  No reported change in diet.  Patient does admit to drinking roughly 1 bottle of wine daily and has been decreased to roughly 1/2 a bottle for 1 to 2 weeks.  Denies any liquor or beer use.  No reported illicit drug use.  Has had worsening agitation over the past few days.  Also with hallucinations including patient reporting that abdominal trunk had breakfast within this morning.  Denies any prior history of psychiatric issues in the past. No reported HI/SI.   Hospital Course:  History heavy drinking (up to 1 bottle wine/day), no previous history withdrawal, several recent social stressors (husband job loss), recently cutting down her drinking, presenting with one day of auditory and visual hallucinations, manic activity, agitation. Treated for alcohol withdrawal with phenobarbital taper. By hospital day one delirium resolved, ambulates independently, no ongoing signs/symptoms  withdrawal. Denies hi/si. Psychiatry consulted, no additional recs. Will discharge home with librium taper and referrals for outpatient mental health and primary care providers.   Procedures: none   Consultations: psychiatry  Discharge Exam: Vitals:   01/10/24 0546 01/10/24 0919  BP: 96/62 (!) 83/51  Pulse: 73 81  Resp: 18 18  Temp: (!) 97.4 F (36.3 C) 97.6 F (36.4 C)  SpO2: 100% 99%    General: tearful Cardiovascular: RRR Respiratory: CTAB Neuro: no tremor, ambulates unassisted  Discharge Instructions   Discharge Instructions     Diet general   Complete by: As directed    Increase activity slowly   Complete by: As directed       Allergies as of 01/10/2024       Reactions   Amoxicillin Rash   Latex Rash        Medication List     TAKE these medications    ALPRAZolam 1 MG tablet Commonly known as: XANAX Take 1 mg by mouth every 8 (eight) hours as needed.   Bone Essentials Caps Take by mouth.   calcium carbonate 1250 (500 Ca) MG tablet Commonly known as: OS-CAL - dosed in mg of elemental calcium Take 1 tablet by mouth daily with breakfast.   chlordiazePOXIDE 25 MG capsule Commonly known as: LIBRIUM 2 tabs three times daily for 1 day then 2 tabs twice daily for 1 day then 2 tabs daily for one day   cyanocobalamin 500 MCG tablet Commonly known as: VITAMIN B12 Take 500 mcg by mouth daily.   estradiol 0.5 MG tablet Commonly known as: ESTRACE Take 0.5 mg by mouth daily.   Fish Oil 1000 MG Caps  Take 1 capsule by mouth daily.   multivitamin tablet Take 1 tablet by mouth daily.   traMADol 50 MG tablet Commonly known as: ULTRAM Take 50 mg by mouth every 6 (six) hours as needed.       Allergies  Allergen Reactions   Amoxicillin Rash   Latex Rash      The results of significant diagnostics from this hospitalization (including imaging, microbiology, ancillary and laboratory) are listed below for reference.    Significant Diagnostic  Studies: DG Chest 2 View Result Date: 01/09/2024 CLINICAL DATA:  Fever EXAM: CHEST - 2 VIEW COMPARISON:  06/24/2022 FINDINGS: Remote left rib fractures with healed deformity. Mild linear scarring in the right lower lung when compared to prior. There is no edema, consolidation, effusion, or pneumothorax. Normal heart size and mediastinal contours. IMPRESSION: No active cardiopulmonary disease. Electronically Signed   By: Tiburcio Pea M.D.   On: 01/09/2024 07:46   CT Head Wo Contrast Result Date: 01/09/2024 CLINICAL DATA:  Mental status change with unknown cause. Fever and headache for 3 days with hallucinations. EXAM: CT HEAD WITHOUT CONTRAST TECHNIQUE: Contiguous axial images were obtained from the base of the skull through the vertex without intravenous contrast. RADIATION DOSE REDUCTION: This exam was performed according to the departmental dose-optimization program which includes automated exposure control, adjustment of the mA and/or kV according to patient size and/or use of iterative reconstruction technique. COMPARISON:  05/24/2021 FINDINGS: Brain: No evidence of acute infarction, hemorrhage, hydrocephalus, or mass. 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 No change since prior. Vascular: No hyperdense vessel or unexpected calcification. Skull: Normal. Negative for fracture or focal lesion. Sinuses/Orbits: No acute finding. IMPRESSION: No acute or interval finding. Electronically Signed   By: Tiburcio Pea M.D.   On: 01/09/2024 07:45   DG PAIN CLINIC C-ARM 1-60 MIN NO REPORT Result Date: 12/24/2023 Fluoro was used, but no Radiologist interpretation will be provided. Please refer to "NOTES" tab for provider progress note.   Microbiology: Recent Results (from the past 240 hours)  Resp panel by RT-PCR (RSV, Flu A&B, Covid) Anterior Nasal Swab     Status: None    Collection Time: 01/09/24  6:55 AM   Specimen: Anterior Nasal Swab  Result Value Ref Range Status   SARS Coronavirus 2 by RT PCR NEGATIVE NEGATIVE Final    Comment: (NOTE) SARS-CoV-2 target nucleic acids are NOT DETECTED.  The SARS-CoV-2 RNA is generally detectable in upper respiratory specimens during the acute phase of infection. The lowest concentration of SARS-CoV-2 viral copies this assay can detect is 138 copies/mL. A negative result does not preclude SARS-Cov-2 infection and should not be used as the sole basis for treatment or other patient management decisions. A negative result may occur with  improper specimen collection/handling, submission of specimen other than nasopharyngeal swab, presence of viral mutation(s) within the areas targeted by this assay, and inadequate number of viral copies(<138 copies/mL). A negative result must be combined with clinical observations, patient history, and epidemiological information. The expected result is Negative.  Fact Sheet for Patients:  BloggerCourse.com  Fact Sheet for Healthcare Providers:  SeriousBroker.it  This test is no t yet approved or cleared by the [place] FDA and  has been authorized for detection and/or diagnosis of SARS-CoV-2 by FDA under an Emergency Use Authorization (EUA). This EUA will remain  in effect (meaning this test can be used)  for the duration of the COVID-19 declaration under Section 564(b)(1) of the Act, 21 U.S.C.section 360bbb-3(b)(1), unless the authorization is terminated  or revoked sooner.       Influenza A by PCR NEGATIVE NEGATIVE Final   Influenza B by PCR NEGATIVE NEGATIVE Final    Comment: (NOTE) The Xpert Xpress SARS-CoV-2/FLU/RSV plus assay is intended as an aid in the diagnosis of influenza from Nasopharyngeal swab specimens and should not be used as a sole basis for treatment. Nasal washings and aspirates are unacceptable for  Xpert Xpress SARS-CoV-2/FLU/RSV testing.  Fact Sheet for Patients: BloggerCourse.com  Fact Sheet for Healthcare Providers: SeriousBroker.it  This test is not yet approved or cleared by the [place] FDA and has been authorized for detection and/or diagnosis of SARS-CoV-2 by FDA under an Emergency Use Authorization (EUA). This EUA will remain in effect (meaning this test can be used) for the duration of the COVID-19 declaration under Section 564(b)(1) of the Act, 21 U.S.C. section 360bbb-3(b)(1), unless the authorization is terminated or revoked.     Resp Syncytial Virus by PCR NEGATIVE NEGATIVE Final    Comment: (NOTE) Fact Sheet for Patients: BloggerCourse.com  Fact Sheet for Healthcare Providers: SeriousBroker.it  This test is not yet approved or cleared by the [place] FDA and has been authorized for detection and/or diagnosis of SARS-CoV-2 by FDA under an Emergency Use Authorization (EUA). This EUA will remain in effect (meaning this test can be used) for the duration of the COVID-19 declaration under Section 564(b)(1) of the Act, 21 U.S.C. section 360bbb-3(b)(1), unless the authorization is terminated or revoked.  Performed at [facility], [address] [place], [place] [address]   Culture, blood (routine x 2)     Status: None (Preliminary result)   Collection Time: 01/09/24  7:31 AM   Specimen: BLOOD  Result Value Ref Range Status   Specimen Description BLOOD RIGHT ANTECUBITAL  Final   Special Requests   Final    BOTTLES DRAWN AEROBIC AND ANAEROBIC Blood Culture adequate volume   Culture   Final    NO GROWTH < 24 HOURS Performed at [facility], [address] [place], [place] [address]    Report Status PENDING  Incomplete  Culture, blood (routine x 2)     Status: None (Preliminary result)   Collection Time: 01/09/24  7:32 AM    Specimen: BLOOD  Result Value Ref Range Status   Specimen Description BLOOD LEFT ANTECUBITAL  Final   Special Requests   Final    BOTTLES DRAWN AEROBIC AND ANAEROBIC Blood Culture adequate volume   Culture   Final    NO GROWTH < 24 HOURS Performed at [facility], [address] [place], [place] [address]    Report Status PENDING  Incomplete     Labs: Basic Metabolic Panel: Recent Labs  Lab 01/09/24 0732 01/10/24 0319  NA 136 135  K 3.2* 3.1*  CL 100 106  CO2 20* 23  GLUCOSE 81 87  BUN 15 10  CREATININE 0.70 0.43*  CALCIUM 10.3 8.5*  MG  --  1.8   Liver Function Tests: Recent Labs  Lab 01/09/24 0732 01/10/24 0319  AST 35 19  ALT 39 22  ALKPHOS 64 45  BILITOT 1.7* 0.9  PROT 8.7* 5.8*  ALBUMIN 5.2* 3.5   No results for input(s): "LIPASE", "AMYLASE" in the last 168 hours. Recent Labs  Lab 01/09/24 0732  AMMONIA <10   CBC: Recent Labs  Lab 01/09/24 0732 01/10/24 0319  WBC 11.6* 7.0  NEUTROABS 9.2*  --   HGB 16.0* 12.5  HCT 45.1 34.2*  MCV 101.1* 99.4  PLT 241 195   Cardiac Enzymes: No results for input(s): "CKTOTAL", "CKMB", "CKMBINDEX", "TROPONINI" in the last 168 hours. BNP: BNP (last 3 results) No results for input(s): "BNP" in the last 8760 hours.  ProBNP (last 3 results) No results for input(s): "PROBNP" in the last 8760 hours.  CBG: No results for input(s): "GLUCAP" in the last 168 hours.     Signed:  Silvano Bilis MD.  [facility] 01/10/2024, 1:50 PM

## 2024-01-10 NOTE — Plan of Care (Signed)
  Problem: Health Behavior/Discharge Planning: Goal: Ability to manage health-related needs will improve Outcome: Progressing   Problem: Clinical Measurements: Goal: Will remain free from infection Outcome: Progressing   Problem: Activity: Goal: Risk for activity intolerance will decrease Outcome: Progressing   Problem: Elimination: Goal: Will not experience complications related to bowel motility Outcome: Progressing   

## 2024-01-11 ENCOUNTER — Telehealth: Payer: Self-pay

## 2024-01-14 ENCOUNTER — Ambulatory Visit: Payer: Commercial Managed Care - PPO | Attending: Pain Medicine | Admitting: Pain Medicine

## 2024-01-14 ENCOUNTER — Encounter: Payer: Self-pay | Admitting: Pain Medicine

## 2024-01-14 VITALS — BP 114/60 | HR 77 | Temp 98.2°F | Resp 14 | Ht 65.0 in | Wt 111.0 lb

## 2024-01-14 DIAGNOSIS — G8929 Other chronic pain: Secondary | ICD-10-CM

## 2024-01-14 DIAGNOSIS — M5412 Radiculopathy, cervical region: Secondary | ICD-10-CM | POA: Diagnosis not present

## 2024-01-14 DIAGNOSIS — G894 Chronic pain syndrome: Secondary | ICD-10-CM | POA: Diagnosis not present

## 2024-01-14 DIAGNOSIS — M542 Cervicalgia: Secondary | ICD-10-CM | POA: Diagnosis not present

## 2024-01-14 DIAGNOSIS — S22000S Wedge compression fracture of unspecified thoracic vertebra, sequela: Secondary | ICD-10-CM

## 2024-01-14 DIAGNOSIS — M546 Pain in thoracic spine: Secondary | ICD-10-CM | POA: Diagnosis not present

## 2024-01-14 DIAGNOSIS — Z09 Encounter for follow-up examination after completed treatment for conditions other than malignant neoplasm: Secondary | ICD-10-CM

## 2024-01-14 DIAGNOSIS — M5134 Other intervertebral disc degeneration, thoracic region: Secondary | ICD-10-CM | POA: Diagnosis not present

## 2024-01-14 LAB — CULTURE, BLOOD (ROUTINE X 2)
Culture: NO GROWTH
Culture: NO GROWTH
Special Requests: ADEQUATE
Special Requests: ADEQUATE

## 2024-01-14 LAB — VITAMIN B1: Vitamin B1 (Thiamine): 239.8 nmol/L — ABNORMAL HIGH (ref 66.5–200.0)

## 2024-01-14 NOTE — Progress Notes (Signed)
 PROVIDER NOTE: Information contained herein reflects review and annotations entered in association with encounter. Interpretation of such information and data should be left to medically-trained personnel. Information provided to patient can be located elsewhere in the medical record under "Patient Instructions". Document created using STT-dictation technology, any transcriptional errors that may result from process are unintentional.    Patient: Miranda Fleming  Service Category: E/M  Provider: Oswaldo Done, MD  DOB: 05/26/1966  DOS: 01/14/2024  Referring Provider: No ref. provider found  MRN: [id number]  Specialty: Interventional Pain Management  PCP: Pcp, No  Type: Established Patient  Setting: Ambulatory outpatient    Location: Office  Delivery: Face-to-face     HPI  [name], a 58 y.o. year old female, is here today because of her Chronic midline thoracic back pain [M54.6, G89.29]. [name] primary complain today is Back Pain (Upper ) and Neck Pain  Pertinent problems: [name] has Thoracic compression fracture [facility]); Knee pain, bilateral; Carpal tunnel syndrome of right wrist; Median neuropathy; Migraine; Neuropathy; Right radial nerve palsy; Ulnar nerve palsy; Chronic pain syndrome; Dupuytren's disease of palm of hands (Bilateral); Chronic thoracic back pain (1ry area of Pain) (Midline); Thoracic spine pain; Osteopenia determined by x-ray; Left cervical radiculopathy; Right cervical radiculopathy; and Cervicalgia on their pertinent problem list. Pain Assessment: Severity of Chronic pain is reported as a 9 /10. Location: Back Upper, Mid/From between shoulder blades to top of abck into lower neck. Numbness in right hand fingers. Onset: More than a month ago. Quality: Burning, Constant. Timing: Constant. Modifying factor(s): back brace, ice, and heat. Vitals:  height is 5\' 5"  (1.651 m) and weight is 111 lb (50.3 kg). Her temporal temperature is 98.2 F (36.8 C).  Her blood pressure is 114/60 and her pulse is 77. Her respiration is 14.  BMI: Estimated body mass index is 18.47 kg/m as calculated from the following:   Height as of this encounter: 5\' 5"  (1.651 m).   Weight as of this encounter: 111 lb (50.3 kg). Last encounter: 12/07/2023. Last procedure: 12/24/2023.  Reason for encounter: post-procedure evaluation and assessment.  Discussed the use of AI scribe software for clinical note transcription with the patient, who gave verbal consent to proceed.  History of Present Illness   Miranda Fleming is a 58 year old female who presents with persistent thoracic pain and numbness following a thoracic epidural steroid injection.  She initially experienced relief from the thoracic epidural steroid injection, as the area was numb and pain-free. However, the numbness dissipated by the end of the evening or the next morning, leading to a return of the burning sensation between her shoulder blades and just above. This was followed by stiffness and constant pain, along with shooting pain down her right arm extending to the fingertips. The right-sided pain persisted for about a week and a half but has since resolved. On the left side, the pain has mostly subsided, except for residual numbness in the pinky finger and some pain in the left shoulder, elbow, and pinky.  She has a history of a fall from bed at 5:00 AM on a Monday morning, resulting in a fracture. Imaging studies, including x-rays and CT scans, have been performed, but not an MRI. She has no pacemaker, contraindications for MRI, or severe claustrophobia.  She has osteoporosis and has undergone a bone density scan performed by her gynecologist. She is not currently on any bone density medication.      Post-procedure evaluation  Inter-Laminar Thoracic Epidural Steroid Block/Injection  #1  Laterality:  Midline Level: T2-3  Imaging: Fluoroscopic guidance Anesthesia: Local anesthesia (1-2%  Lidocaine) Anxiolysis: IV Versed 2 mg Sedation: None.  No fentanyl. DOS: 12/24/2023 Performed by: Oswaldo Done, MD  Purpose: Diagnostic/Therapeutic Indications: Thoracic back pain, radicular pain, with degenerative disc disease severe enough to impact quality of life or function. 1. Chronic thoracic back pain (1ry area of Pain) (Midline)   2. Thoracic compression fracture, sequela (<20%) (T1, T2, T3, and T4)   3. Thoracic spine pain   4. Latex precautions, history of latex allergy    NAS-11 Pain score:   Pre-procedure: 9 /10   Post-procedure: 6 /10     Effectiveness:  Initial hour after procedure: 100 %. Subsequent 4-6 hours post-procedure: 100 %. Analgesia past initial 6 hours: 5 % (Increase neck ROM. No longer radiates pain down arms bialteral. Does have numbness of the fingers of the right hand.). Ongoing improvement:  Analgesic: The patient indicates having attained 100% relief of the pain for the duration of the local anesthetic with recurrence of some of the upper back pain but with significant relief of the upper extremity radicular symptoms. Function: [name] reports improvement in function ROM: [name] reports improvement in ROM   Pharmacotherapy Assessment  Analgesic: No chronic opioid analgesics therapy prescribed by our practice. Tramadol 50 mg tablet, 1 tab p.o. daily (# 30) (30/month) (last filled on 11/28/2023) MME/day: 10 mg/day   Monitoring: [place] PMP: PDMP reviewed during this encounter.       Pharmacotherapy: No side-effects or adverse reactions reported. Compliance: No problems identified. Effectiveness: Clinically acceptable.  No notes on file  No results found for: "CBDTHCR" No results found for: "D8THCCBX" No results found for: "D9THCCBX"  UDS:  Summary  Date Value Ref Range Status  12/07/2023 FINAL  Final    Comment:    ==================================================================== Compliance Drug Analysis,  Ur ==================================================================== Specimen Alert Not Detected result may be consistent with the time of last use noted for this medication. AS NEEDED (Tramadol) ==================================================================== Test                             Result       Flag       Units  Drug Present and Declared for Prescription Verification   Alprazolam                     419          EXPECTED   ng/mg creat   Alpha-hydroxyalprazolam        758          EXPECTED   ng/mg creat    Source of alprazolam is a scheduled prescription medication. Alpha-    hydroxyalprazolam is an expected metabolite of alprazolam.  Drug Present not Declared for Prescription Verification   Ibuprofen                      PRESENT      UNEXPECTED   Diphenhydramine                PRESENT      UNEXPECTED  Drug Absent but Declared for Prescription Verification   Tramadol                       Not Detected UNEXPECTED ng/mg creat ==================================================================== Test  Result    Flag   Units      Ref Range   Creatinine              31               mg/dL      >=96 ==================================================================== Declared Medications:  The flagging and interpretation on this report are based on the  following declared medications.  Unexpected results may arise from  inaccuracies in the declared medications.   **Note: The testing scope of this panel includes these medications:   Alprazolam (Xanax)  Tramadol (Ultram)   **Note: The testing scope of this panel does not include the  following reported medications:   Calcium  Estradiol (Estrace)  Fish Oil  Multivitamin  Supplement  Vitamin B12 ==================================================================== For clinical consultation, please call [phone]. ====================================================================        ROS  Constitutional: Denies any fever or chills Gastrointestinal: No reported hemesis, hematochezia, vomiting, or acute GI distress Musculoskeletal: Denies any acute onset joint swelling, redness, loss of ROM, or weakness Neurological: No reported episodes of acute onset apraxia, aphasia, dysarthria, agnosia, amnesia, paralysis, loss of coordination, or loss of consciousness  Medication Review  ALPRAZolam, Bone Essentials, Fish Oil, Progesterone, calcium carbonate, chlordiazePOXIDE, cyanocobalamin, estradiol, multivitamin, and traMADol  History Review  Allergy: [name] is allergic to amoxicillin and latex. Drug: [name]  has no history on file for drug use. Alcohol:  reports current alcohol use of about 4.0 standard drinks of alcohol per week. Tobacco:  reports that she quit smoking about 2 years ago. Her smoking use included cigarettes. She started smoking about 43 years ago. She has a 61.6 pack-year smoking history. She has never used smokeless tobacco. Social: [name]  reports that she quit smoking about 2 years ago. Her smoking use included cigarettes. She started smoking about 43 years ago. She has a 61.6 pack-year smoking history. She has never used smokeless tobacco. She reports current alcohol use of about 4.0 standard drinks of alcohol per week. Medical:  has a past medical history of DVT, lower extremity, distal, acute (HCC), Palpitations (11/23/2015), SUI (stress urinary incontinence, female), and Wears glasses. Surgical: [name]  has a past surgical history that includes EXPLORATORY LAPAROTOMY W/  LEFT SALPINGOOPHORECTOMY (01-25-2001); Abdominal hysterectomy (1995); LAPAROSCOPY'S AND LAPAROTOMY'S FOR PERSISTANT OVARIAN CYST (X 5  1987;  1989;  1990;  1993;  1994); and Pubovaginal sling (N/A, 10/31/2013). Family: family history includes Breast cancer in her mother; Colon cancer in her paternal grandfather; Diabetes in her maternal aunt; Healthy in her brother and  father; Osteopenia in her sister; Osteoporosis in her mother; Ovarian cancer in her maternal grandmother and mother.  Laboratory Chemistry Profile   Renal Lab Results  Component Value Date   BUN 10 01/10/2024   CREATININE 0.43 (L) 01/10/2024   BCR 19 12/07/2023   GFRAA >60 12/18/2017   GFRNONAA >60 01/10/2024    Hepatic Lab Results  Component Value Date   AST 19 01/10/2024   ALT 22 01/10/2024   ALBUMIN 3.5 01/10/2024   ALKPHOS 45 01/10/2024   LIPASE 35 05/24/2021   AMMONIA <10 01/09/2024    Electrolytes Lab Results  Component Value Date   NA 135 01/10/2024   K 3.1 (L) 01/10/2024   CL 106 01/10/2024   CALCIUM 8.5 (L) 01/10/2024   MG 1.8 01/10/2024    Bone Lab Results  Component Value Date   VD25OH 50.8 12/18/2017   25OHVITD1 68 12/07/2023   25OHVITD2  41 12/07/2023   25OHVITD3 27 12/07/2023    Inflammation (CRP: Acute Phase) (ESR: Chronic Phase) Lab Results  Component Value Date   CRP <1 12/07/2023   ESRSEDRATE 3 12/07/2023   LATICACIDVEN 1.4 01/09/2024         Note: Above Lab results reviewed.  Recent Imaging Review  DG Chest 2 View CLINICAL DATA:  Fever  EXAM: CHEST - 2 VIEW  COMPARISON:  06/24/2022  FINDINGS: Remote left rib fractures with healed deformity. Mild linear scarring in the right lower lung when compared to prior. There is no edema, consolidation, effusion, or pneumothorax. Normal heart size and mediastinal contours.  IMPRESSION: No active cardiopulmonary disease.  Electronically Signed   By: Tiburcio Pea M.D.   On: 01/09/2024 07:46 CT Head Wo Contrast CLINICAL DATA:  Mental status change with unknown cause. Fever and headache for 3 days with hallucinations.  EXAM: CT HEAD WITHOUT CONTRAST  TECHNIQUE: Contiguous axial images were obtained from the base of the skull through the vertex without intravenous contrast.  RADIATION DOSE REDUCTION: This exam was performed according to the departmental dose-optimization program  which includes automated exposure control, adjustment of the mA and/or kV according to patient size and/or use of iterative reconstruction technique.  COMPARISON:  05/24/2021  FINDINGS: Brain: No evidence of acute infarction, hemorrhage, hydrocephalus, or mass. 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 No change since prior.  Vascular: No hyperdense vessel or unexpected calcification.  Skull: Normal. Negative for fracture or focal lesion.  Sinuses/Orbits: No acute finding.  IMPRESSION: No acute or interval finding.  Electronically Signed   By: Tiburcio Pea M.D.   On: 01/09/2024 07:45 Note: Reviewed        Physical Exam  General appearance: Well nourished, well developed, and well hydrated. In no apparent acute distress Mental status: Alert, oriented x 3 (person, place, & time)       Respiratory: No evidence of acute respiratory distress Eyes: PERLA Vitals: BP 114/60 (Patient Position: Sitting, Cuff Size: Normal)   Pulse 77   Temp 98.2 F (36.8 C) (Temporal)   Resp 14   Ht 5\' 5"  (1.651 m)   Wt 111 lb (50.3 kg)   BMI 18.47 kg/m  BMI: Estimated body mass index is 18.47 kg/m as calculated from the following:   Height as of this encounter: 5\' 5"  (1.651 m).   Weight as of this encounter: 111 lb (50.3 kg). Ideal: Ideal body weight: 57 kg (125 lb 10.6 oz)  Assessment   Diagnosis Status  1. Chronic thoracic back pain (1ry area of Pain) (Midline)   2. Thoracic spine pain   3. Chronic pain syndrome   4. Postop check   5. Compression fracture of thoracic vertebra, unspecified thoracic vertebral level, sequela   6. Cervicalgia   7. Other intervertebral disc degeneration, thoracic region   8. Left cervical radiculopathy   9. Right cervical radiculopathy    Controlled Controlled Controlled   Updated Problems: Problem  Left Cervical  Radiculopathy  Right Cervical Radiculopathy  Cervicalgia  Thoracic Compression Fracture (Hcc)   Superior endplate compression fractures of the T1, T2, T3, and T4 vertebral bodies     Plan of Care  Problem-specific:  Assessment and Plan    Thoracic and Cervical Radiculopathy   She experiences a persistent burning sensation between the shoulder blades, stiffness, and shooting pain down the right arm to the  fingertips, which has resolved on the right side and mostly on the left side except for numbness in the left pinky finger. Pain in the left shoulder persists, suggesting cervical radiculopathy. Previous imaging includes x-rays and CT scans, but an MRI is necessary to evaluate soft tissue and potential disc herniation. A previous epidural injection provided some relief, warranting repetition to address persistent pain and swelling. Order a cervical and thoracic MRI to assess for disc herniation or other soft tissue abnormalities. Repeat midline thoracic epidural steroid injection, up to three times, spaced two weeks apart, to address persistent pain and swelling.  Osteoporosis with Fracture   She has experienced fractures with minor trauma, including a fall from bed resulting in a fracture. A bone density scan has been completed, but she is not on any bone density medication. She is concerned about the side effects of osteoporosis medications but acknowledges the risk of untreated osteoporosis leading to more fractures. It is important to discuss treatment options with her primary care physician to initiate bone-strengthening medication. Discuss osteoporosis treatment options with her primary care physician to initiate bone-strengthening medication.      [name] has a current medication list which includes the following long-term medication(s): calcium carbonate and progesterone.  Pharmacotherapy (Medications Ordered): No orders of the defined types were placed in this  encounter.  Orders:  Orders Placed This Encounter  Procedures   Thoracic Epidural Injection    Standing Status:   Future    Expiration Date:   04/15/2024    Scheduling Instructions:     Level: T3-4     Laterality: Midline     Sedation: With Sedation.     Timeframe: ASAP    Where will this procedure be performed?:   [facility]   MR CERVICAL SPINE WO CONTRAST    Patient presents with axial pain with possible radicular component. Please assist [place] in identifying specific level(s) and laterality of any additional findings such as: 1. Facet (Zygapophyseal) joint DJD (Hypertrophy, space narrowing, subchondral sclerosis, and/or osteophyte formation) 2. DDD and/or IVDD (Loss of disc height, desiccation, gas patterns, osteophytes, endplate sclerosis, or "Black disc disease") 3. Pars defects 4. Spondylolisthesis, spondylosis, and/or spondyloarthropathies (include Degree/Grade of displacement in mm) (stability) 5. Vertebral body Fractures (acute/chronic) (state percentage of collapse) 6. Demineralization (osteopenia/osteoporotic) 7. Bone pathology 8. Foraminal narrowing  9. Surgical changes 10. Central, Lateral Recess, and/or Foraminal Stenosis (include AP diameter of stenosis in mm) 11. Surgical changes (hardware type, status, and presence of fibrosis) 12. Modic Type Changes (MRI only) 13. IVDD (Disc bulge, protrusion, herniation, extrusion) (Level, laterality, extent)    Standing Status:   Future    Expiration Date:   04/15/2024    Scheduling Instructions:     Please make sure that the patient understands that this needs to be done as soon as possible. Never have the patient do the imaging "just before the next appointment". Inform patient that having the imaging done within the [facility] will expedite the availability of the results and will provide      imaging availability to the requesting physician. In addition inform the patient that the imaging order has an expiration date and  will not be renewed if not done within the active period.    What is the patient's sedation requirement?:   No Sedation    Does the patient have a pacemaker or implanted devices?:   No    Preferred imaging location?:   [facility] (table limit-350lbs)  Call Results- Best Contact Number?:   [phone] [facility]    Radiology Contrast Protocol - do NOT remove file path:   [email or web address]   MR THORACIC SPINE WO CONTRAST    Patient presents with axial pain with possible radicular component. Please assist [place] in identifying specific level(s) and laterality of any additional findings such as: 1. Facet (Zygapophyseal) joint DJD (Hypertrophy, space narrowing, subchondral sclerosis, and/or osteophyte formation) 2. DDD and/or IVDD (Loss of disc height, desiccation, gas patterns, osteophytes, endplate sclerosis, or "Black disc disease") 3. Pars defects 4. Spondylolisthesis, spondylosis, and/or spondyloarthropathies (include Degree/Grade of displacement in mm) (stability) 5. Vertebral body Fractures (acute/chronic) (state percentage of collapse) 6. Demineralization (osteopenia/osteoporotic) 7. Bone pathology 8. Foraminal narrowing  9. Surgical changes 10. Central, Lateral Recess, and/or Foraminal Stenosis (include AP diameter of stenosis in mm) 11. Surgical changes (hardware type, status, and presence of fibrosis) 12. Modic Type Changes (MRI only) 13. IVDD (Disc bulge, protrusion, herniation, extrusion) (Level, laterality, extent)    Standing Status:   Future    Expiration Date:   04/15/2024    Scheduling Instructions:     Please make sure that the patient understands that this needs to be done as soon as possible. Never have the patient do the imaging "just before the next appointment". Inform patient that having the imaging done within the [facility] will expedite the availability of the results and will  provide      imaging availability to the requesting physician. In addition inform the patient that the imaging order has an expiration date and will not be renewed if not done within the active period.    What is the patient's sedation requirement?:   No Sedation    Does the patient have a pacemaker or implanted devices?:   No    Preferred imaging location?:   [facility] (table limit-350lbs)    Call Results- Best Contact Number?:   [phone] [place] [facility] at [facility]    Radiology Contrast Protocol - do NOT remove file path:   [email or web address]   Nursing Instructions:    Please complete this patient's postprocedure evaluation.    Scheduling Instructions:     Please complete this patient's postprocedure evaluation.   Follow-up plan:   Return for (ECT): (L) T3-4 TESI #2.      Interventional Therapies  Risk Factors  Considerations  Medical Comorbidities:  Allergy: LATEX  Hx. DVT     Planned  Pending:   Diagnostic/therapeutic left (T3-4) thoracic ESI #2    Under consideration:   Diagnostic/therapeutic midline/left (T2-3) thoracic ESI #2    Completed:   Diagnostic/therapeutic midline/left (T2-3) thoracic ESI x1 (12/24/2023) (9-6/10) (100/100/5/RUE:100LUE:90UB:5-10)    Therapeutic  Palliative (PRN) options:   None established   Completed by other providers:   None reported     Recent Visits Date Type Provider Dept  12/24/23 Procedure visit Delano Metz, MD [facility]  12/07/23 Office Visit Delano Metz, MD [facility]  Showing recent visits within past 90 days and meeting all other requirements Today's Visits Date Type Provider Dept  01/14/24 Office Visit Delano Metz, MD [facility]  Showing today's visits and meeting all other requirements Future Appointments Date Type Provider Dept  01/21/24 Appointment Delano Metz, MD [facility]  Showing future appointments within next 90 days and meeting all other requirements  I discussed the assessment and treatment plan with the patient. The patient was provided  an opportunity to ask questions and all were answered. The patient agreed with the plan and demonstrated an understanding of the instructions.  Patient advised to call back or seek an in-person evaluation if the symptoms or condition worsens.  Duration of encounter: 30 minutes.  Total time on encounter, as per AMA guidelines included both the face-to-face and non-face-to-face time personally spent by the physician and/or other qualified health care professional(s) on the day of the encounter (includes time in activities that require the physician or other qualified health care professional and does not include time in activities normally performed by clinical staff). Physician's time may include the following activities when performed: Preparing to see the patient (e.g., pre-charting review of records, searching for previously ordered imaging, lab work, and nerve conduction tests) Review of prior analgesic pharmacotherapies. Reviewing PMP Interpreting ordered tests (e.g., lab work, imaging, nerve conduction tests) Performing post-procedure evaluations, including interpretation of diagnostic procedures Obtaining and/or reviewing separately obtained history Performing a medically appropriate examination and/or evaluation Counseling and educating the patient/family/caregiver Ordering medications, tests, or procedures Referring and communicating with other health care professionals (when not separately reported) Documenting clinical information in the electronic or other health record Independently interpreting results (not separately reported) and communicating results to the patient/ family/caregiver Care coordination (not separately reported)  Note by: Oswaldo Done, MD Date: 01/14/2024; Time: 3:46 PM

## 2024-01-14 NOTE — Patient Instructions (Signed)

## 2024-01-21 ENCOUNTER — Ambulatory Visit: Admitting: Pain Medicine

## 2024-02-04 ENCOUNTER — Ambulatory Visit
Admission: RE | Admit: 2024-02-04 | Discharge: 2024-02-04 | Disposition: A | Discharge status: Home / Self Care | Source: Ambulatory Visit | Attending: Pain Medicine | Admitting: Pain Medicine

## 2024-02-04 DIAGNOSIS — G8929 Other chronic pain: Secondary | ICD-10-CM

## 2024-02-04 DIAGNOSIS — M5412 Radiculopathy, cervical region: Secondary | ICD-10-CM | POA: Insufficient documentation

## 2024-02-04 DIAGNOSIS — M5134 Other intervertebral disc degeneration, thoracic region: Secondary | ICD-10-CM

## 2024-02-04 DIAGNOSIS — S22000S Wedge compression fracture of unspecified thoracic vertebra, sequela: Secondary | ICD-10-CM

## 2024-02-04 DIAGNOSIS — M546 Pain in thoracic spine: Secondary | ICD-10-CM | POA: Diagnosis present

## 2024-02-04 DIAGNOSIS — M542 Cervicalgia: Secondary | ICD-10-CM | POA: Insufficient documentation

## 2024-02-11 DIAGNOSIS — Z653 Problems related to other legal circumstances: Secondary | ICD-10-CM | POA: Insufficient documentation

## 2024-02-11 DIAGNOSIS — M47894 Other spondylosis, thoracic region: Secondary | ICD-10-CM | POA: Insufficient documentation

## 2024-02-11 DIAGNOSIS — Z7689 Persons encountering health services in other specified circumstances: Secondary | ICD-10-CM | POA: Insufficient documentation

## 2024-02-11 DIAGNOSIS — Z681 Body mass index (BMI) 19 or less, adult: Secondary | ICD-10-CM | POA: Insufficient documentation

## 2024-02-11 DIAGNOSIS — Z024 Encounter for examination for driving license: Secondary | ICD-10-CM | POA: Insufficient documentation

## 2024-02-16 ENCOUNTER — Ambulatory Visit
Admission: RE | Admit: 2024-02-16 | Discharge: 2024-02-16 | Disposition: A | Discharge status: Home / Self Care | Attending: Neurosurgery | Admitting: Neurosurgery

## 2024-02-16 ENCOUNTER — Ambulatory Visit
Admission: RE | Admit: 2024-02-16 | Discharge: 2024-02-16 | Disposition: A | Discharge status: Home / Self Care | Source: Ambulatory Visit | Attending: Neurosurgery | Admitting: Neurosurgery

## 2024-02-16 ENCOUNTER — Other Ambulatory Visit: Payer: Self-pay | Admitting: Pain Medicine

## 2024-02-16 ENCOUNTER — Telehealth: Payer: Self-pay | Admitting: Pain Medicine

## 2024-02-16 DIAGNOSIS — M5412 Radiculopathy, cervical region: Secondary | ICD-10-CM

## 2024-02-16 DIAGNOSIS — M542 Cervicalgia: Secondary | ICD-10-CM

## 2024-02-16 DIAGNOSIS — I6501 Occlusion and stenosis of right vertebral artery: Secondary | ICD-10-CM | POA: Insufficient documentation

## 2024-02-16 DIAGNOSIS — S22000A Wedge compression fracture of unspecified thoracic vertebra, initial encounter for closed fracture: Secondary | ICD-10-CM | POA: Diagnosis present

## 2024-02-16 DIAGNOSIS — S22020D Wedge compression fracture of second thoracic vertebra, subsequent encounter for fracture with routine healing: Secondary | ICD-10-CM | POA: Diagnosis present

## 2024-02-16 DIAGNOSIS — G8929 Other chronic pain: Secondary | ICD-10-CM

## 2024-02-16 NOTE — Telephone Encounter (Signed)
 [facility] call with the results for patient MRI. Please give their office a call back. [phone] ty

## 2024-02-17 ENCOUNTER — Other Ambulatory Visit: Payer: Self-pay | Admitting: Nurse Practitioner

## 2024-02-17 ENCOUNTER — Other Ambulatory Visit: Payer: Self-pay | Admitting: Pain Medicine

## 2024-02-17 ENCOUNTER — Ambulatory Visit (INDEPENDENT_AMBULATORY_CARE_PROVIDER_SITE_OTHER): Payer: Commercial Managed Care - PPO | Admitting: Neurosurgery

## 2024-02-17 ENCOUNTER — Telehealth: Payer: Self-pay | Admitting: Nurse Practitioner

## 2024-02-17 VITALS — BP 110/70 | Ht 65.0 in | Wt 111.0 lb

## 2024-02-17 DIAGNOSIS — S22020D Wedge compression fracture of second thoracic vertebra, subsequent encounter for fracture with routine healing: Secondary | ICD-10-CM

## 2024-02-17 DIAGNOSIS — W19XXXD Unspecified fall, subsequent encounter: Secondary | ICD-10-CM

## 2024-02-17 DIAGNOSIS — S22030D Wedge compression fracture of third thoracic vertebra, subsequent encounter for fracture with routine healing: Secondary | ICD-10-CM

## 2024-02-17 DIAGNOSIS — S22040D Wedge compression fracture of fourth thoracic vertebra, subsequent encounter for fracture with routine healing: Secondary | ICD-10-CM | POA: Diagnosis not present

## 2024-02-17 DIAGNOSIS — S22010D Wedge compression fracture of first thoracic vertebra, subsequent encounter for fracture with routine healing: Secondary | ICD-10-CM

## 2024-02-17 DIAGNOSIS — I6501 Occlusion and stenosis of right vertebral artery: Secondary | ICD-10-CM

## 2024-02-17 NOTE — Telephone Encounter (Signed)
 02/16/2024 -MRI results reviewed and symptoms discussed with Miranda Fleming.  Today, we reviewed the results of the patient's MRI and discussed her ongoing symptoms the patient reports the following concern:  Weakness on the left side of her body which she noticed during daily activities, numbness in her right arm that radiating tingling down to the pinky finger, slightly blurry vision bilateral, difficulty sleeping at night which may be related to discomfort or any neurological disorder.  She states that she needs to spread both legs apart in order to maintain her balance while standing or walking, occasional dizziness and vertigo, no significant headaches was reported at that this time.  I discussed how the symptoms correlate with the MRI findings.   Randalyn Rhea NP

## 2024-02-17 NOTE — Progress Notes (Signed)
 Referring Physician:  Bettey Costa, NP [address] [place],  [place] [address]  Primary Physician:  System, Provider Not In  History of Present Illness: 02/17/2024 [name] is here today with a chief complaint of a fall with thoracic compression fractures seen in the emergency department and referred to clinic.  She was seen previously and opted for conservative care  She continues to have significant pain in her periscapular region and lower neck and upper back.  She has returned to work on [employer] duty and does have some difficulty maintaining her shifts given the activities that she is required to do.  She continues to have periscapular and back pain.  This continues to impact her life.  She is currently wearing a TLSO without a cervical extension.  We did mention that given the location of her upper thoracic fractures that a TLSO without a cervical extension would not likely give her adequate support but she feels more comfortable with it.  She has been seeing pain team.  I have utilized the care everywhere function in epic to review the outside records available from external health systems.  Review of Systems:  A 10 point review of systems is negative, except for the pertinent positives and negatives detailed in the HPI.  Past Medical History: Past Medical History:  Diagnosis Date   DVT, lower extremity, distal, acute (HCC)    paired right peroneal veins; 2 weeks after back surgery   Palpitations 11/23/2015   SUI (stress urinary incontinence, female)    Wears glasses     Past Surgical History: Past Surgical History:  Procedure Laterality Date   ABDOMINAL HYSTERECTOMY  1995   W/ RIGHT SALPINGOOPHORECTOMY   EXPLORATORY LAPAROTOMY W/  LEFT SALPINGOOPHORECTOMY  01-25-2001   LAPAROSCOPY'S AND LAPAROTOMY'S FOR PERSISTANT OVARIAN CYST  X 5  1987;  1989;  1990;  1993;  1994   PUBOVAGINAL SLING N/A 10/31/2013   Procedure: LYNX PUBO-VAGINAL SLING;   Surgeon: Kathi Ludwig, MD;  Location: [facility];  Service: Urology;  Laterality: N/A;    Allergies: Allergies as of 02/17/2024 - Review Complete 02/17/2024  Allergen Reaction Noted   Amoxicillin Rash 03/20/2011   Latex Rash 10/26/2013    Medications:  Current Outpatient Medications:    ALPRAZolam (XANAX) 1 MG tablet, Take 1 mg by mouth every 8 (eight) hours as needed., Disp: , Rfl:    calcium carbonate (OS-CAL - DOSED IN MG OF ELEMENTAL CALCIUM) 1250 (500 Ca) MG tablet, Take 1 tablet by mouth daily with breakfast., Disp: , Rfl:    cyanocobalamin (VITAMIN B12) 500 MCG tablet, Take 500 mcg by mouth daily., Disp: , Rfl:    estradiol (ESTRACE) 0.5 MG tablet, Take 0.5 mg by mouth daily., Disp: , Rfl:    Multiple Minerals-Vitamins (BONE ESSENTIALS) CAPS, Take by mouth., Disp: , Rfl:    Multiple Vitamin (MULTIVITAMIN) tablet, Take 1 tablet by mouth daily., Disp: , Rfl:    Omega-3 Fatty Acids (FISH OIL) 1000 MG CAPS, Take 1 capsule by mouth daily., Disp: , Rfl:    PROGESTERONE PO, Take by mouth., Disp: , Rfl:    traMADol (ULTRAM) 50 MG tablet, Take 50 mg by mouth every 6 (six) hours as needed., Disp: , Rfl:   Social History: Social History   Tobacco Use   Smoking status: Former    Current packs/day: 0.00    Average packs/day: 1.5 packs/day for 41.0 years (61.6 ttl pk-yrs)    Types: Cigarettes  Start date: 10/26/1980    Quit date: 2023    Years since quitting: 2.2   Smokeless tobacco: Never  Vaping Use   Vaping status: Never Used  Substance Use Topics   Alcohol use: Yes    Alcohol/week: 4.0 standard drinks of alcohol    Types: 4 Standard drinks or equivalent per week    Family Medical History: Family History  Problem Relation Age of Onset   Ovarian cancer Mother    Osteoporosis Mother    Breast cancer Mother        Age 73   Diabetes Maternal Aunt    Ovarian cancer Maternal Grandmother    Colon cancer Paternal Grandfather    Healthy Father     Osteopenia Sister    Healthy Brother     Physical Examination: Vitals:   02/17/24 1329  BP: 110/70    General: Patient is in no apparent distress. Attention to examination is appropriate.  Neck:   Pain to palpation at the cervical thoracic junction.  Respiratory: Patient is breathing without any difficulty.   NEUROLOGICAL:     Awake, alert, oriented to person, place, and time.  Speech is clear and fluent.   Cranial Nerves: Pupils equal round and reactive to light.  Facial tone is symmetric.  Facial sensation is symmetric. Shoulder shrug is symmetric. Tongue protrusion is midline.    Strength: Continues to have limited range of motion of the bilateral shoulders.  This is secondary to pain but continues to be present.  She is at least 4 4 out of 5 in the bilateral upper extremities.  Bilateral lower extremities are at least 4 out of 5.  Reflexes are 2 + and symmetric at the biceps, triceps, brachioradialis, patella and achilles.   Hoffman's is absent. Clonus is absent  Bilateral upper and lower extremity sensation is intact to light touch .     Gait is normal.    Imaging: I personally reviewed her x-rays from/06/2024, no evidence of progressive worsening of her fractures on my evaluation.  We will follow-up on the final reads  Medical Decision Making/Assessment and Plan: [name] is a pleasant 58 y.o. female with with upper thoracic compression fractures.  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  Given their confirmation they are likely stable at this point.  She has been wearing a TLSO brace, but has not opted for a cervical extension.  At her last appointment we recommended the CTO if she wanted to have support of her upper thoracic spine however she did not opt to go forward with this type of bracing.  She does get some comfort with the TLSO.  She is not showing any signs of myelopathy.  Her imaging appears stable.  Will follow-up on her final x-rays.  She should  have evaluation for functional capacity evaluation as she continues to have limited work ability.  She should continue to work with pain medicine.  Thank you for involving me in the care of this patient.   I spent a total of 30 minutes reviewing her imaging, going over her previous recommendations, going over her recent history, in person evaluation, and coordination of her care.  Lovenia Kim MD/MSCR Neurosurgery

## 2024-02-19 ENCOUNTER — Encounter: Payer: Self-pay | Admitting: Neurosurgery

## 2024-02-25 ENCOUNTER — Ambulatory Visit: Admitting: Pain Medicine

## 2024-02-29 ENCOUNTER — Telehealth: Payer: Self-pay | Admitting: Pain Medicine

## 2024-02-29 ENCOUNTER — Telehealth: Payer: Self-pay | Admitting: Neurosurgery

## 2024-02-29 NOTE — Telephone Encounter (Signed)
 Patient refused to be seen Monday because it was so far; patient added to waitlist, will call her back tomorrow with any openings

## 2024-02-29 NOTE — Telephone Encounter (Signed)
 Compression Fracture T2 with new numbness, tingling in left upper extremity and left lower extremity from the knee down. Also having headaches but is getting a work up for possible vertebral artery occulsion. Dr. Marica Shoals ordered CT/ MRI angio.

## 2024-02-29 NOTE — Telephone Encounter (Signed)
 Patient is calling that for the past week she has been experiencing numbness and tingling from her left shoulder down into her hand and pinky. This weekend she had numbness and tingling in her left knee down into her foot. She said that she is having increased headaches also. She had to call out sick today. She was scheduled for an injection with Dr. Naveira on 02/25/24 but then it was rescheduled to 03/10/24 due to the provider being out of the office. She is currently not in PT, the therapist wanted to hold off on any further treatment until she has her angiogram. Is there anything that you can offer her? She already called Dr.Naveira and there is nothing he can offer her.

## 2024-02-29 NOTE — Telephone Encounter (Signed)
 Patient states she is having a lot of numbness and tingling on left side upper and lower extremity. Was supposed to have injections last Gerarda Knights but was rescheduled to May 1. She wants to now if there is anything she can do. She was not able to go to work today. Was told by Dr Barth Borne she has an artery problem getting blood to her brain.

## 2024-03-01 NOTE — Telephone Encounter (Signed)
 Patient confirmed appt for 03/01/2024

## 2024-03-02 ENCOUNTER — Encounter: Payer: Self-pay | Admitting: Neurosurgery

## 2024-03-02 ENCOUNTER — Ambulatory Visit (INDEPENDENT_AMBULATORY_CARE_PROVIDER_SITE_OTHER): Admitting: Neurosurgery

## 2024-03-02 VITALS — BP 120/78 | Ht 65.0 in | Wt 111.0 lb

## 2024-03-02 DIAGNOSIS — G5622 Lesion of ulnar nerve, left upper limb: Secondary | ICD-10-CM | POA: Insufficient documentation

## 2024-03-02 DIAGNOSIS — S22040D Wedge compression fracture of fourth thoracic vertebra, subsequent encounter for fracture with routine healing: Secondary | ICD-10-CM

## 2024-03-02 DIAGNOSIS — S22030D Wedge compression fracture of third thoracic vertebra, subsequent encounter for fracture with routine healing: Secondary | ICD-10-CM | POA: Diagnosis not present

## 2024-03-02 DIAGNOSIS — S22010D Wedge compression fracture of first thoracic vertebra, subsequent encounter for fracture with routine healing: Secondary | ICD-10-CM

## 2024-03-02 DIAGNOSIS — S22020D Wedge compression fracture of second thoracic vertebra, subsequent encounter for fracture with routine healing: Secondary | ICD-10-CM | POA: Diagnosis not present

## 2024-03-02 DIAGNOSIS — W19XXXD Unspecified fall, subsequent encounter: Secondary | ICD-10-CM

## 2024-03-02 MED ORDER — GABAPENTIN 300 MG PO CAPS: ORAL_CAPSULE | ORAL | 0 refills | Status: DC

## 2024-03-02 NOTE — Patient Instructions (Addendum)
 Please try sleeping with an ulnar nerve brace, these can be picked up over the counter at most pharmacies. A heelbo brace is a padded elbow brace that can protect the nerve during the day.   We will order your nerve test for [facility].   We have added gabapentin , and would like to follow up with a phone visit to check on side effects in 1 month

## 2024-03-02 NOTE — Progress Notes (Signed)
 Referring Physician:  No referring provider defined for this encounter.  Primary Physician:  System, Provider Not In  History of Present Illness: 03/02/2024 Miranda Fleming is here today with a chief complaint of a fall with thoracic compression fractures seen in the emergency department and referred to clinic.  She was seen previously and opted for conservative care.  We had her come in today as she sent us  a message with some left upper extremity and some intermittent lower extremity discomfort on the medial aspect of her arm up into her proximal arm.  She has not previously had any ulnar neuropathy.  She does state that it is predominantly on the medial aspect of her forearm and fingers.  She is also felt like she has had decreased dexterity in her hand as well.  She feels like this is progressing.  I have utilized the care everywhere function in epic to review the outside records available from external health systems.  Review of Systems:  A 10 point review of systems is negative, except for the pertinent positives and negatives detailed in the HPI.  Past Medical History: Past Medical History:  Diagnosis Date   DVT, lower extremity, distal, acute (HCC)    paired right peroneal veins; 2 weeks after back surgery   Palpitations 11/23/2015   SUI (stress urinary incontinence, female)    Wears glasses     Past Surgical History: Past Surgical History:  Procedure Laterality Date   ABDOMINAL HYSTERECTOMY  1995   W/ RIGHT SALPINGOOPHORECTOMY   EXPLORATORY LAPAROTOMY W/  LEFT SALPINGOOPHORECTOMY  01-25-2001   LAPAROSCOPY'S AND LAPAROTOMY'S FOR PERSISTANT OVARIAN CYST  X 5  1987;  1989;  1990;  1993;  1994   PUBOVAGINAL SLING N/A 10/31/2013   Procedure: LYNX PUBO-VAGINAL SLING;  Surgeon: Edmund Gouge, MD;  Location: [facility];  Service: Urology;  Laterality: N/A;    Allergies: Allergies as of 03/02/2024 - Review Complete 03/02/2024  Allergen Reaction  Noted   Amoxicillin Rash 03/20/2011   Latex Rash 10/26/2013    Medications:  Current Outpatient Medications:    ALPRAZolam  (XANAX ) 1 MG tablet, Take 1 mg by mouth every 8 (eight) hours as needed., Disp: , Rfl:    calcium carbonate (OS-CAL - DOSED IN MG OF ELEMENTAL CALCIUM) 1250 (500 Ca) MG tablet, Take 1 tablet by mouth daily with breakfast., Disp: , Rfl:    cyanocobalamin  (VITAMIN B12) 500 MCG tablet, Take 500 mcg by mouth daily., Disp: , Rfl:    estradiol  (ESTRACE ) 0.5 MG tablet, Take 0.5 mg by mouth daily., Disp: , Rfl:    gabapentin  (NEURONTIN ) 300 MG capsule, Take 1 capsule (300 mg total) by mouth at bedtime for 14 days, THEN 1 capsule (300 mg total) 2 (two) times daily for 14 days, THEN 1 capsule (300 mg total) 3 (three) times daily for 14 days., Disp: 84 capsule, Rfl: 0   Multiple Minerals-Vitamins (BONE ESSENTIALS) CAPS, Take by mouth., Disp: , Rfl:    Multiple Vitamin (MULTIVITAMIN) tablet, Take 1 tablet by mouth daily., Disp: , Rfl:    Omega-3 Fatty Acids (FISH OIL) 1000 MG CAPS, Take 1 capsule by mouth daily., Disp: , Rfl:    PROGESTERONE PO, Take by mouth., Disp: , Rfl:    traMADol (ULTRAM) 50 MG tablet, Take 50 mg by mouth every 6 (six) hours as needed., Disp: , Rfl:   Social History: Social History   Tobacco Use   Smoking status: Former    Current packs/day: 0.00  Average packs/day: 1.5 packs/day for 41.0 years (61.6 ttl pk-yrs)    Types: Cigarettes    Start date: 10/26/1980    Quit date: 2023    Years since quitting: 2.3   Smokeless tobacco: Never  Vaping Use   Vaping status: Never Used  Substance Use Topics   Alcohol use: Yes    Alcohol/week: 4.0 standard drinks of alcohol    Types: 4 Standard drinks or equivalent per week    Family Medical History: Family History  Problem Relation Age of Onset   Ovarian cancer Mother    Osteoporosis Mother    Breast cancer Mother        Age 55   Diabetes Maternal Aunt    Ovarian cancer Maternal Grandmother    Colon  cancer Paternal Grandfather    Healthy Father    Osteopenia Sister    Healthy Brother     Physical Examination: Vitals:   03/02/24 1026  BP: 120/78    General: Patient is in no apparent distress. Attention to examination is appropriate.  Neck:   Pain to palpation at the cervical thoracic junction.  Respiratory: Patient is breathing without any difficulty.   NEUROLOGICAL:     Awake, alert, oriented to person, place, and time.  Speech is clear and fluent.   Cranial Nerves: Pupils equal round and reactive to light.  Facial tone is symmetric.  Facial sensation is symmetric. Shoulder shrug is symmetric. Tongue protrusion is midline.    Strength: Continues to have limited range of motion of the bilateral shoulders.  This is secondary to pain but continues to be present.  She is at least 4 4 out of 5 in the bilateral upper extremities.  Bilateral lower extremities are at least 4 out of 5.  On more focused motor examination, she does have some slight weakness in her ulnar innervated intrinsic musculature.  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  This reproduces the majority of her pain.  Reflexes are 2 + and symmetric at the biceps, triceps, brachioradialis, patella and achilles.   Hoffman's is absent. Clonus is absent  Bilateral upper and lower extremity sensation is intact to light touch .     Gait is normal.    Imaging: I personally reviewed her x-rays from/06/2024, no evidence of progressive worsening of her fractures on my evaluation.  We will follow-up on the final reads  Medical Decision Making/Assessment and Plan: Miranda Fleming is a pleasant 58 y.o. female with with upper thoracic compression fractures.  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  Given their confirmation they are likely stable at this point.  She came in today for evaluation of left upper extremity numbness and tingling.  She states that this is been progressive and is a  relatively new symptom for her.  On physical examination she has some slight ulnar weakness with a severe Tinel sign at the cubital tunnel.  She states that this reproduces the majority of her painful symptoms that she has been experiencing recently.  Will plan for EMG and nerve conduction study as well as an ultrasound of her ulnar nerve at the elbow as this would be quite consistent with her current symptoms, it appears to be like a neuritis.  Will plan to have her pad and brace her elbow during sleep and would like to follow-up with her after her EMG and nerve conduction study.  Orders have been placed.  At this time we do  not think this is cervical in nature.  Thank you for involving me in the care of this patient.   I spent a total of 30 minutes reviewing her imaging, going over her previous recommendations, going over her recent history, in person evaluation, and coordination of her care.  Carroll Clamp MD/MSCR Neurosurgery

## 2024-03-09 ENCOUNTER — Other Ambulatory Visit: Payer: Self-pay

## 2024-03-09 ENCOUNTER — Encounter: Payer: Self-pay | Admitting: Neurology

## 2024-03-09 DIAGNOSIS — R202 Paresthesia of skin: Secondary | ICD-10-CM

## 2024-03-10 ENCOUNTER — Ambulatory Visit
Admission: RE | Admit: 2024-03-10 | Discharge: 2024-03-10 | Disposition: A | Discharge status: Home / Self Care | Source: Ambulatory Visit | Attending: Pain Medicine | Admitting: Pain Medicine

## 2024-03-10 ENCOUNTER — Encounter: Payer: Self-pay | Admitting: Pain Medicine

## 2024-03-10 ENCOUNTER — Ambulatory Visit: Attending: Pain Medicine | Admitting: Pain Medicine

## 2024-03-10 VITALS — BP 109/66 | HR 76 | Temp 97.5°F | Resp 15 | Ht 65.0 in | Wt 110.0 lb

## 2024-03-10 DIAGNOSIS — M5489 Other dorsalgia: Secondary | ICD-10-CM

## 2024-03-10 DIAGNOSIS — M546 Pain in thoracic spine: Secondary | ICD-10-CM

## 2024-03-10 DIAGNOSIS — M47814 Spondylosis without myelopathy or radiculopathy, thoracic region: Secondary | ICD-10-CM | POA: Insufficient documentation

## 2024-03-10 DIAGNOSIS — M47812 Spondylosis without myelopathy or radiculopathy, cervical region: Secondary | ICD-10-CM | POA: Insufficient documentation

## 2024-03-10 DIAGNOSIS — M4314 Spondylolisthesis, thoracic region: Secondary | ICD-10-CM

## 2024-03-10 DIAGNOSIS — M5030 Other cervical disc degeneration, unspecified cervical region: Secondary | ICD-10-CM

## 2024-03-10 DIAGNOSIS — S22000S Wedge compression fracture of unspecified thoracic vertebra, sequela: Secondary | ICD-10-CM

## 2024-03-10 DIAGNOSIS — R937 Abnormal findings on diagnostic imaging of other parts of musculoskeletal system: Secondary | ICD-10-CM

## 2024-03-10 DIAGNOSIS — M5114 Intervertebral disc disorders with radiculopathy, thoracic region: Secondary | ICD-10-CM | POA: Diagnosis not present

## 2024-03-10 DIAGNOSIS — M5134 Other intervertebral disc degeneration, thoracic region: Secondary | ICD-10-CM

## 2024-03-10 DIAGNOSIS — M4854XS Collapsed vertebra, not elsewhere classified, thoracic region, sequela of fracture: Secondary | ICD-10-CM | POA: Insufficient documentation

## 2024-03-10 DIAGNOSIS — M4312 Spondylolisthesis, cervical region: Secondary | ICD-10-CM | POA: Insufficient documentation

## 2024-03-10 DIAGNOSIS — G8929 Other chronic pain: Secondary | ICD-10-CM

## 2024-03-10 DIAGNOSIS — Z91040 Latex allergy status: Secondary | ICD-10-CM

## 2024-03-10 MED ORDER — ROPIVACAINE HCL 2 MG/ML IJ SOLN
INTRAMUSCULAR | Status: AC
  Filled 2024-03-10: qty 20

## 2024-03-10 MED ORDER — DEXAMETHASONE SODIUM PHOSPHATE 10 MG/ML IJ SOLN
INTRAMUSCULAR | Status: AC
  Filled 2024-03-10: qty 1

## 2024-03-10 MED ORDER — IOHEXOL 180 MG/ML  SOLN
INTRAMUSCULAR | Status: AC
  Filled 2024-03-10: qty 10

## 2024-03-10 MED ORDER — LIDOCAINE HCL 2 % IJ SOLN
20.0000 mL | Freq: Once | INTRAMUSCULAR | Status: AC
  Administered 2024-03-10: 400 mg

## 2024-03-10 MED ORDER — SODIUM CHLORIDE 0.9% FLUSH
2.0000 mL | Freq: Once | INTRAVENOUS | Status: AC
  Administered 2024-03-10: 2 mL

## 2024-03-10 MED ORDER — FENTANYL CITRATE (PF) 100 MCG/2ML IJ SOLN
25.0000 ug | INTRAMUSCULAR | Status: AC | PRN
  Administered 2024-03-10 (×2): 50 ug via INTRAVENOUS

## 2024-03-10 MED ORDER — ROPIVACAINE HCL 2 MG/ML IJ SOLN
2.0000 mL | Freq: Once | INTRAMUSCULAR | Status: AC
Start: 2024-03-10 — End: 2024-03-10
  Administered 2024-03-10: 2 mL via EPIDURAL

## 2024-03-10 MED ORDER — MIDAZOLAM HCL 5 MG/5ML IJ SOLN
INTRAMUSCULAR | Status: AC
  Filled 2024-03-10: qty 5

## 2024-03-10 MED ORDER — PENTAFLUOROPROP-TETRAFLUOROETH EX AERO
INHALATION_SPRAY | Freq: Once | CUTANEOUS | Status: AC
Start: 2024-03-10 — End: 2024-03-10
  Administered 2024-03-10: 30 via TOPICAL

## 2024-03-10 MED ORDER — MIDAZOLAM HCL 5 MG/5ML IJ SOLN
0.5000 mg | Freq: Once | INTRAMUSCULAR | Status: AC
Start: 2024-03-10 — End: 2024-03-10
  Administered 2024-03-10: 1 mg via INTRAVENOUS
  Administered 2024-03-10: 2 mg via INTRAVENOUS

## 2024-03-10 MED ORDER — FENTANYL CITRATE (PF) 100 MCG/2ML IJ SOLN
INTRAMUSCULAR | Status: AC
  Filled 2024-03-10: qty 2

## 2024-03-10 MED ORDER — DEXAMETHASONE SODIUM PHOSPHATE 10 MG/ML IJ SOLN
10.0000 mg | Freq: Once | INTRAMUSCULAR | Status: AC
Start: 2024-03-10 — End: 2024-03-10
  Administered 2024-03-10: 10 mg

## 2024-03-10 MED ORDER — SODIUM CHLORIDE (PF) 0.9 % IJ SOLN
INTRAMUSCULAR | Status: AC
Start: 2024-03-10 — End: ?
  Filled 2024-03-10: qty 10

## 2024-03-10 MED ORDER — LIDOCAINE HCL 2 % IJ SOLN
INTRAMUSCULAR | Status: AC
  Filled 2024-03-10: qty 20

## 2024-03-10 MED ORDER — IOHEXOL 180 MG/ML  SOLN
10.0000 mL | Freq: Once | INTRAMUSCULAR | Status: AC
Start: 2024-03-10 — End: 2024-03-10
  Administered 2024-03-10: 10 mL via EPIDURAL

## 2024-03-10 NOTE — Progress Notes (Signed)
 PROVIDER NOTE: Interpretation of information contained herein should be left to medically-trained personnel. Specific patient instructions are provided elsewhere under "Patient Instructions" section of medical record. This document was created in part using STT-dictation technology, any transcriptional errors that may result from this process are unintentional.  Patient: Miranda Fleming Type: Established DOB: 08-Mar-1966 MRN: [id number] PCP: System, Provider Not In  Service: Procedure DOS: 03/10/2024 Setting: Ambulatory Location: Ambulatory outpatient facility Delivery: Face-to-face Provider: Candi Chafe, MD Specialty: Interventional Pain Management Specialty designation: 09 Location: Outpatient facility Ref. Prov.: No ref. provider found       Interventional Therapy   Primary Reason for Visit: Interventional Pain Management Treatment. CC: Back Pain (In between shoulder blades (right side worse))  Procedure:           Inter-Laminar Thoracic Epidural Steroid Block/Injection  #2  Laterality:  Left Level: T3-4  Imaging: Fluoroscopic guidance Anesthesia: Local anesthesia (1-2% Lidocaine ) Anxiolysis: IV Versed  3 mg Sedation: Moderate conscious sedation. Fentanyl  100 mcg DOS: 03/10/2024 Performed by: Candi Chafe, MD  Purpose: Diagnostic/Therapeutic Indications: Thoracic back pain, radicular pain, with degenerative disc disease severe enough to impact quality of life or function. 1. Chronic thoracic back pain (1ry area of Pain) (Midline)   2. Thoracic spine pain   3. Abnormal MRI, thoracic spine (02/16/2024)   4. Non-traumatic compression fracture of T2 thoracic vertebra, sequela   5. Non-traumatic compression fracture of T3 thoracic vertebra, sequela   6. Non-traumatic compression fracture of T4 thoracic vertebra, sequela   7. Non-traumatic compression fracture of T1 thoracic vertebra, sequela   8. Compression of thoracic vertebra, sequela (T1, T2, T3, T4)   9. DDD  (degenerative disc disease), thoracic   10. Thoracic vertebrogenic pain   11. Thoracic facet arthropathy   12. Thoracic facet joint pain   13. Grade 1 Anterolisthesis of thoracic spine (T2-3)   14. DDD (degenerative disc disease), cervical    NAS-11 Pain score:   Pre-procedure: 9 /10   Post-procedure: 0-No pain/10     Position / Prep / Materials:  Position: Prone  Prep solution: ChloraPrep (2% chlorhexidine gluconate and 70% isopropyl alcohol) Prep Area: Posterior Thoracolumbar (Upper back from shoulders to lower lumbar region).  Materials:  Tray: Epidural Needle(s) Type: Epidural needle Gauge (G): 17 Length: Regular (3.5-in) Qty: 1 H&P (Pre-op Assessment):  Miranda Fleming is a 58 y.o. (year old), female patient, seen today for interventional treatment. She  has a past surgical history that includes EXPLORATORY LAPAROTOMY W/  LEFT SALPINGOOPHORECTOMY (01-25-2001); Abdominal hysterectomy (1995); LAPAROSCOPY'S AND LAPAROTOMY'S FOR PERSISTANT OVARIAN CYST (X 5  1987;  1989;  1990;  1993;  1994); and Pubovaginal sling (N/A, 10/31/2013). Miranda Fleming has a current medication list which includes the following prescription(s): alprazolam , calcium carbonate, cyanocobalamin , estradiol , gabapentin , bone essentials, multivitamin, fish oil, progesterone, and tramadol. Her primarily concern today is the Back Pain (In between shoulder blades (right side worse))  Initial Vital Signs:  Pulse/HCG Rate: 86ECG Heart Rate: 65 Temp: 98.2 F (36.8 C) Resp: 16 BP: 112/66 SpO2: 100 %  BMI: Estimated body mass index is 18.3 kg/m as calculated from the following:   Height as of this encounter: 5\' 5"  (1.651 m).   Weight as of this encounter: 110 lb (49.9 kg).  Risk Assessment: Allergies: Reviewed. She is allergic to amoxicillin and latex.  Allergy Precautions: None required Coagulopathies: Reviewed. None identified.  Blood-thinner therapy: None at this time Active Infection(s): Reviewed. None identified.  Miranda Fleming is afebrile  Site Confirmation: Miranda Fleming was  asked to confirm the procedure and laterality before marking the site Procedure checklist: Completed Consent: Before the procedure and under the influence of no sedative(s), amnesic(s), or anxiolytics, the patient was informed of the treatment options, risks and possible complications. To fulfill our ethical and legal obligations, as recommended by the American Medical Association's Code of Ethics, I have informed the patient of my clinical impression; the nature and purpose of the treatment or procedure; the risks, benefits, and possible complications of the intervention; the alternatives, including doing nothing; the risk(s) and benefit(s) of the alternative treatment(s) or procedure(s); and the risk(s) and benefit(s) of doing nothing. The patient was provided information about the general risks and possible complications associated with the procedure. These may include, but are not limited to: failure to achieve desired goals, infection, bleeding, organ or nerve damage, allergic reactions, paralysis, and death. In addition, the patient was informed of those risks and complications associated to Spine-related procedures, such as failure to decrease pain; infection (i.e.: Meningitis, epidural or intraspinal abscess); bleeding (i.e.: epidural hematoma, subarachnoid hemorrhage, or any other type of intraspinal or peri-dural bleeding); organ or nerve damage (i.e.: Any type of peripheral nerve, nerve root, or spinal cord injury) with subsequent damage to sensory, motor, and/or autonomic systems, resulting in permanent pain, numbness, and/or weakness of one or several areas of the body; allergic reactions; (i.e.: anaphylactic reaction); and/or death. Furthermore, the patient was informed of those risks and complications associated with the medications. These include, but are not limited to: allergic reactions (i.e.: anaphylactic or anaphylactoid  reaction(s)); adrenal axis suppression; blood sugar elevation that in diabetics may result in ketoacidosis or comma; water  retention that in patients with history of congestive heart failure may result in shortness of breath, pulmonary edema, and decompensation with resultant heart failure; weight gain; swelling or edema; medication-induced neural toxicity; particulate matter embolism and blood vessel occlusion with resultant organ, and/or nervous system infarction; and/or aseptic necrosis of one or more joints. Finally, the patient was informed that Medicine is not an exact science; therefore, there is also the possibility of unforeseen or unpredictable risks and/or possible complications that may result in a catastrophic outcome. The patient indicated having understood very clearly. We have given the patient no guarantees and we have made no promises. Enough time was given to the patient to ask questions, all of which were answered to the patient's satisfaction. Ms. Kesten has indicated that she wanted to continue with the procedure. Attestation: I, the ordering provider, attest that I have discussed with the patient the benefits, risks, side-effects, alternatives, likelihood of achieving goals, and potential problems during recovery for the procedure that I have provided informed consent. Date  Time: 03/10/2024  8:11 AM  Pre-Procedure Preparation:  Monitoring: As per clinic protocol. Respiration, ETCO2, SpO2, BP, heart rate and rhythm monitor placed and checked for adequate function Safety Precautions: Patient was assessed for positional comfort and pressure points before starting the procedure. Time-out: I initiated and conducted the "Time-out" before starting the procedure, as per protocol. The patient was asked to participate by confirming the accuracy of the "Time Out" information. Verification of the correct person, site, and procedure were performed and confirmed by me, the nursing staff, and the  patient. "Time-out" conducted as per Joint Commission's Universal Protocol (UP.01.01.01). Time: 0928 Start Time: 0928 hrs.  Description of Procedure:          Target Area: For Epidural Steroid injection(s), the target area is the  interlaminar space, initially targeting the lower border of  the superior vertebral body lamina. Approach: Interlaminar approach. Area Prepped: Entire Posterior Thoracolumbar Region ChloraPrep (2% chlorhexidine gluconate and 70% isopropyl alcohol) Safety Precautions: Aspiration looking for blood return was conducted prior to all injections. At no point did we inject any substances, as a needle was being advanced. No attempts were made at seeking any paresthesias. Safe injection practices and needle disposal techniques used. Medications properly checked for expiration dates. SDV (single dose vial) medications used. Description of the Procedure: Protocol guidelines were followed. The patient was placed in position over the fluoroscopy table. The target area was identified and the area prepped in the usual manner. Skin & deeper tissues infiltrated with local anesthetic. Appropriate amount of time allowed to pass for local anesthetics to take effect. The procedure needles were then advanced to the target area. The inferior aspect of the superior lamina was contacted and the needle walked caudad, until the lamina was cleared. The epidural space was identified using "loss-of-resistance technique" with 0.9% PF-NSS (2-34mL), in a low friction 10cc LOR glass syringe. Proper needle placement was secured. Negative aspiration confirmed. Solution injected in intermittent fashion, asking for systemic symptoms every 0.5 cc of injectate. The needles were then removed and the area cleansed, making sure to leave some of the prepping solution behind to take advantage of its long term bactericidal properties. Vitals:   03/10/24 0933 03/10/24 0946 03/10/24 0955 03/10/24 1005  BP: 108/69 (!) 127/55  121/66 109/66  Pulse: 76     Resp: 18 13 (!) 22 15  Temp:  98.1 F (36.7 C)  (!) 97.5 F (36.4 C)  TempSrc:    Temporal  SpO2: 100% 98% 97% 98%  Weight:      Height:        Start Time: 0928 hrs. End Time: 0931 hrs. Imaging Guidance (Spinal):          Type of Imaging Technique: Fluoroscopy Guidance (Spinal) Indication(s): Fluoroscopy guidance for needle placement to enhance accuracy in procedures requiring precise needle localization for targeted delivery of medication in or near specific anatomical locations not easily accessible without such real-time imaging assistance. Exposure Time: Please see nurses notes. Contrast: Before injecting any contrast, we confirmed that the patient did not have an allergy to iodine, shellfish, or radiological contrast. Once satisfactory needle placement was completed at the desired level, radiological contrast was injected. Contrast injected under live fluoroscopy. No contrast complications. See chart for type and volume of contrast used. Fluoroscopic Guidance: I was personally present during the use of fluoroscopy. "Tunnel Vision Technique" used to obtain the best possible view of the target area. Parallax error corrected before commencing the procedure. "Direction-depth-direction" technique used to introduce the needle under continuous pulsed fluoroscopy. Once target was reached, antero-posterior, oblique, and lateral fluoroscopic projection used confirm needle placement in all planes. Images permanently stored in EMR. Interpretation: I personally interpreted the imaging intraoperatively. Adequate needle placement confirmed in multiple planes. Appropriate spread of contrast into desired area was observed. No evidence of afferent or efferent intravascular uptake. No intrathecal or subarachnoid spread observed. Permanent images saved into the patient's record.  Antibiotic Prophylaxis:   Anti-infectives (From admission, onward)    None      Indication(s):  None identified  Post-operative Assessment:  Post-procedure Vital Signs:  Pulse/HCG Rate: 7669 Temp: (!) 97.5 F (36.4 C) Resp: 15 BP: 109/66 SpO2: 98 %  EBL: None  Complications: No immediate post-treatment complications observed by team, or reported by patient.  Note: The patient tolerated the entire procedure well. A  repeat set of vitals were taken after the procedure and the patient was kept under observation following institutional policy, for this type of procedure. Post-procedural neurological assessment was performed, showing return to baseline, prior to discharge. The patient was provided with post-procedure discharge instructions, including a section on how to identify potential problems. Should any problems arise concerning this procedure, the patient was given instructions to immediately contact us , at any time, without hesitation. In any case, we plan to contact the patient by telephone for a follow-up status report regarding this interventional procedure.  Comments:  No additional relevant information.  Plan of Care (POC)  Orders:  Orders Placed This Encounter  Procedures   Thoracic Epidural Injection    Scheduling Instructions:     Side: Left-sided     Sedation: With Sedation.     Timeframe: Today    Where will this procedure be performed?:   [facility]   [facility] C-ARM 1-60 MIN NO REPORT    Intraoperative interpretation by procedural physician at [facility].    Standing Status:   Standing    Number of Occurrences:   1    Reason for exam::   Assistance in needle guidance and placement for procedures requiring needle placement in or near specific anatomical locations not easily accessible without such assistance.   Informed Consent Details: Physician/Practitioner Attestation; Transcribe to consent form and obtain patient signature    Provider Attestation: I, Laquanta Hummel A. Barth Borne, MD, (Pain Management Specialist), the physician/practitioner,  attest that I have discussed with the patient the benefits, risks, side effects, alternatives, likelihood of achieving goals and potential problems during recovery for the procedure that I have provided informed consent.    Scheduling Instructions:     Nursing Order: Transcribe to consent form and obtain patient signature.     Note: Always confirm laterality of pain with Ms. Gohlke, before procedure.    Physician/Practitioner attestation of informed consent for procedure/surgical case:   I, the physician/practitioner, attest that I have discussed with the patient the benefits, risks, side effects, alternatives, likelihood of achieving goals and potential problems during recovery for the procedure that I have provided informed consent.    Procedure:   Thoracic Epidural Steroid Injection/Block under fluoroscopic guidance    Physician/Practitioner performing the procedure:   Maitlyn Penza A. Barth Borne MD    Indication/Reason:   Upper (Thoracic) Back Pain with or without Thoracic Radicular Pain (Rib/Flank pain) secondary to Thoracic Degenerative Disc Disease and/or Thoracic Intervertebral Disc Displacement, with or without Thoracic Radiculitis/Radiculopathy.   Provide equipment / supplies at bedside    Procedural tray: Epidural Tray (Disposable  single use) Skin infiltration needle: Regular 1.5-in, 25-G, (x1) Block needle size: Regular standard Catheter: No catheter required    Standing Status:   Standing    Number of Occurrences:   1    Specify:   Epidural Tray   Saline lock IV    Have LR [phone] mL available and administer at 125 mL/hr if patient becomes hypotensive.    Standing Status:   Standing    Number of Occurrences:   1   Latex precautions    Activate Latex-Free Protocol.    Standing Status:   Standing    Number of Occurrences:   1   Chronic Opioid Analgesic:   No chronic opioid analgesics therapy prescribed by our practice. Tramadol 50 mg tablet, 1 tab p.o. daily (# 30) (30/month) (last  filled on 11/28/2023) MME/day: 10 mg/day    Medications ordered for  procedure: Meds ordered this encounter  Medications   iohexol  (OMNIPAQUE ) 180 MG/ML injection 10 mL    Must be Myelogram-compatible. If not available, you may substitute with a water -soluble, non-ionic, hypoallergenic, myelogram-compatible radiological contrast medium.   lidocaine  (XYLOCAINE ) 2 % (with pres) injection 400 mg   pentafluoroprop-tetrafluoroeth (GEBAUERS) aerosol   midazolam  (VERSED ) 5 MG/5ML injection 0.5-2 mg    Make sure Flumazenil is available in the pyxis when using this medication. If oversedation occurs, administer 0.2 mg IV over 15 sec. If after 45 sec no response, administer 0.2 mg again over 1 min; may repeat at 1 min intervals; not to exceed 4 doses (1 mg)   fentaNYL  (SUBLIMAZE ) injection 25-50 mcg    Make sure Narcan is available in the pyxis when using this medication. In the event of respiratory depression (RR< 8/min): Titrate NARCAN (naloxone) in increments of 0.1 to 0.2 mg IV at 2-3 minute intervals, until desired degree of reversal.   sodium chloride  flush (NS) 0.9 % injection 2 mL   ropivacaine  (PF) 2 mg/mL (0.2%) (NAROPIN ) injection 2 mL   dexamethasone  (DECADRON ) injection 10 mg   Medications administered: We administered iohexol , lidocaine , pentafluoroprop-tetrafluoroeth, midazolam , fentaNYL , sodium chloride  flush, ropivacaine  (PF) 2 mg/mL (0.2%), and dexamethasone .  See the medical record for exact dosing, route, and time of administration.  Follow-up plan:   Return in about 2 weeks (around 03/24/2024) for (Face2F), (PPE).       Interventional Therapies  Risk Factors  Considerations  Medical Comorbidities:  Allergy: LATEX  Hx. DVT     Planned  Pending:   Diagnostic/therapeutic left (T3-4) thoracic ESI #2    Under consideration:   Diagnostic/therapeutic midline/left (T2-3) thoracic ESI #2    Completed:   Diagnostic/therapeutic midline/left (T2-3) thoracic ESI x1  (12/24/2023) (9-6/10) (100/100/5/RUE:100LUE:90UB:5-10)    Therapeutic  Palliative (PRN) options:   None established   Completed by other providers:   None reported      Recent Visits Date Type Provider Dept  01/14/24 Office Visit Renaldo Caroli, MD [facility]  12/24/23 Procedure visit Renaldo Caroli, MD [facility]  Showing recent visits within past 90 days and meeting all other requirements Today's Visits Date Type Provider Dept  03/10/24 Procedure visit Renaldo Caroli, MD [facility]  Showing today's visits and meeting all other requirements Future Appointments Date Type Provider Dept  03/23/24 Appointment Renaldo Caroli, MD [facility]  Showing future appointments within next 90 days and meeting all other requirements  Disposition: Discharge home  Discharge (Date  Time): 03/10/2024; 1010 hrs.   Primary Care Physician: System, Provider Not In Location: [facility] Note by: Candi Chafe, MD (TTS technology used. I apologize for any typographical errors that were not detected and corrected.) Date: 03/10/2024; Time: 11:16 AM  Disclaimer:  Medicine is not an Visual merchandiser. The only guarantee in medicine is that nothing is guaranteed. It is important to note that the decision to proceed with this intervention was based on the information collected from the patient. The Data and conclusions were drawn from the patient's questionnaire, the interview, and the physical examination. Because the information was provided in large part by the patient, it cannot be guaranteed that it has not been purposely or unconsciously manipulated. Every effort has been made to obtain as much relevant data as possible for this evaluation. It is important to note that the conclusions that lead to this procedure are derived in large part from the available data. Always take  into account that the treatment will also be  dependent on availability of resources and existing treatment guidelines, considered by other Pain Management Practitioners as being common knowledge and practice, at the time of the intervention. For Medico-Legal purposes, it is also important to point out that variation in procedural techniques and pharmacological choices are the acceptable norm. The indications, contraindications, technique, and results of the above procedure should only be interpreted and judged by a Board-Certified Interventional Pain Specialist with extensive familiarity and expertise in the same exact procedure and technique.

## 2024-03-10 NOTE — Patient Instructions (Addendum)
 ______________________________________________________________________    Post-Procedure Discharge Instructions  Instructions: Apply ice:  Purpose: This will minimize any swelling and discomfort after procedure.  When: Day of procedure, as soon as you get home. How: Fill a plastic sandwich bag with crushed ice. Cover it with a small towel and apply to injection site. How long: (15 min on, 15 min off) Apply for 15 minutes then remove x 15 minutes.  Repeat sequence on day of procedure, until you go to bed. Apply heat:  Purpose: To treat any soreness and discomfort from the procedure. When: Starting the next day after the procedure. How: Apply heat to procedure site starting the day following the procedure. How long: May continue to repeat daily, until discomfort goes away. Food intake: Start with clear liquids (like water ) and advance to regular food, as tolerated.  Physical activities: Keep activities to a minimum for the first 8 hours after the procedure. After that, then as tolerated. Driving: If you have received any sedation, be responsible and do not drive. You are not allowed to drive for 24 hours after having sedation. Blood thinner: (Applies only to those taking blood thinners) You may restart your blood thinner 6 hours after your procedure. Insulin: (Applies only to Diabetic patients taking insulin) As soon as you can eat, you may resume your normal dosing schedule. Infection prevention: Keep procedure site clean and dry. Shower daily and clean area with soap and water . Post-procedure Pain Diary: Extremely important that this be done correctly and accurately. Recorded information will be used to determine the next step in treatment. For the purpose of accuracy, follow these rules: Evaluate only the area treated. Do not report or include pain from an untreated area. For the purpose of this evaluation, ignore all other areas of pain, except for the treated area. After your procedure,  avoid taking a long nap and attempting to complete the pain diary after you wake up. Instead, set your alarm clock to go off every hour, on the hour, for the initial 8 hours after the procedure. Document the duration of the numbing medicine, and the relief you are getting from it. Do not go to sleep and attempt to complete it later. It will not be accurate. If you received sedation, it is likely that you were given a medication that may cause amnesia. Because of this, completing the diary at a later time may cause the information to be inaccurate. This information is needed to plan your care. Follow-up appointment: Keep your post-procedure follow-up evaluation appointment after the procedure (usually 2 weeks for most procedures, 6 weeks for radiofrequencies). DO NOT FORGET to bring you pain diary with you.   Expect: (What should I expect to see with my procedure?) From numbing medicine (AKA: Local Anesthetics): Numbness or decrease in pain. You may also experience some weakness, which if present, could last for the duration of the local anesthetic. Onset: Full effect within 15 minutes of injected. Duration: It will depend on the type of local anesthetic used. On the average, 1 to 8 hours.  From steroids (Applies only if steroids were used): Decrease in swelling or inflammation. Once inflammation is improved, relief of the pain will follow. Onset of benefits: Depends on the amount of swelling present. The more swelling, the longer it will take for the benefits to be seen. In some cases, up to 10 days. Duration: Steroids will stay in the system x 2 weeks. Duration of benefits will depend on multiple posibilities including persistent irritating factors. Side-effects: If  present, they may typically last 2 weeks (the duration of the steroids). Frequent: Cramps (if they occur, drink Gatorade and take over-the-counter Magnesium 450-500 mg once to twice a day); water  retention with temporary weight gain;  increases in blood sugar; decreased immune system response; increased appetite. Occasional: Facial flushing (red, warm cheeks); mood swings; menstrual changes. Uncommon: Long-term decrease or suppression of natural hormones; bone thinning. (These are more common with higher doses or more frequent use. This is why we prefer that our patients avoid having any injection therapies in other practices.)  Very Rare: Severe mood changes; psychosis; aseptic necrosis. From procedure: Some discomfort is to be expected once the numbing medicine wears off. This should be minimal if ice and heat are applied as instructed.  Call if: (When should I call?) You experience numbness and weakness that gets worse with time, as opposed to wearing off. New onset bowel or bladder incontinence. (Applies only to procedures done in the spine)  Emergency Numbers: Durning business hours (Monday - Thursday, 8:00 AM - 4:00 PM) (Friday, 9:00 AM - 12:00 Noon): [phone] After hours: [phone] NOTE: If you are having a problem and are unable connect with, or to talk to a provider, then go to your nearest urgent care or emergency department. If the problem is serious and urgent, please call 911. ______________________________________________________________________     ____________________________________________________________________________________________  Spondylolisthesis  Spondylolisthesis is a condition that occurs when a vertebra in the spine slips out of place, usually in the lower back. Symptoms can vary from mild to severe, and a person may have no symptoms.  Some common symptoms include:  Back pain, especially chronic pain  Pain that radiates down the legs  Pain that worsens with exercise  Tightness in the hamstrings  Neck stiffness  Loss of spine flexibility  Weakness in the legs or trouble walking  Numbness and tingling in the groin and/or buttocks   Some causes of spondylolisthesis  include: Birth defects. Sudden injury. Abnormal wear on the cartilage and bones, such as arthritis. Bone disease and fractures. Certain sports activities, such as gymnastics, weightlifting, and football.  A doctor can diagnose spondylolisthesis with a physical exam, X-rays, and possibly a CT scan.    Forward slippage is known as "Anterolisthesis".  Backward slippage is known as "Retrolisthesis".   Pathophysiology of Spondylolisthesis:   Grading Classification of Spondylolisthesis Grade I spondylolisthesis is 1 to 25% slippage, grade II is up to 50% slippage, grade III is up to 75% slippage, and grade IV is 76-100% slippage. If there is more than 100% slippage, it is known as spondyloptosis or grade V spondylolisthesis.       ____________________________________________________________________________________________

## 2024-03-11 ENCOUNTER — Telehealth: Payer: Self-pay | Admitting: *Deleted

## 2024-03-11 NOTE — Telephone Encounter (Signed)
 No problems post procedure.

## 2024-03-14 ENCOUNTER — Ambulatory Visit
Admission: RE | Admit: 2024-03-14 | Discharge: 2024-03-14 | Disposition: A | Discharge status: Home / Self Care | Source: Ambulatory Visit | Attending: Pain Medicine | Admitting: Pain Medicine

## 2024-03-14 DIAGNOSIS — M5412 Radiculopathy, cervical region: Secondary | ICD-10-CM

## 2024-03-14 DIAGNOSIS — M542 Cervicalgia: Secondary | ICD-10-CM | POA: Insufficient documentation

## 2024-03-14 DIAGNOSIS — G8929 Other chronic pain: Secondary | ICD-10-CM | POA: Insufficient documentation

## 2024-03-14 DIAGNOSIS — I6501 Occlusion and stenosis of right vertebral artery: Secondary | ICD-10-CM | POA: Diagnosis present

## 2024-03-14 DIAGNOSIS — M546 Pain in thoracic spine: Secondary | ICD-10-CM | POA: Diagnosis present

## 2024-03-14 MED ORDER — GADOBUTROL 1 MMOL/ML IV SOLN
5.0000 mL | Freq: Once | INTRAVENOUS | Status: AC | PRN
  Administered 2024-03-14: 5 mL via INTRAVENOUS

## 2024-03-16 ENCOUNTER — Ambulatory Visit: Admitting: Family Medicine

## 2024-03-22 ENCOUNTER — Encounter: Payer: Self-pay | Admitting: Neurology

## 2024-03-22 ENCOUNTER — Ambulatory Visit: Payer: Self-pay | Admitting: Neurosurgery

## 2024-03-22 ENCOUNTER — Ambulatory Visit: Admitting: Neurology

## 2024-03-22 ENCOUNTER — Other Ambulatory Visit: Payer: Self-pay

## 2024-03-22 DIAGNOSIS — R202 Paresthesia of skin: Secondary | ICD-10-CM

## 2024-03-22 DIAGNOSIS — S22020D Wedge compression fracture of second thoracic vertebra, subsequent encounter for fracture with routine healing: Secondary | ICD-10-CM

## 2024-03-22 DIAGNOSIS — M79602 Pain in left arm: Secondary | ICD-10-CM

## 2024-03-22 NOTE — Progress Notes (Unsigned)
 PROVIDER NOTE: Interpretation of information contained herein should be left to medically-trained personnel. Specific patient instructions are provided elsewhere under "Patient Instructions" section of medical record. This document was created in part using AI and STT-dictation technology, any transcriptional errors that may result from this process are unintentional.  Patient: Miranda Fleming  Service: E/M   PCP: System, Provider Not In  DOB: 29-Jun-1966  DOS: 03/23/2024  Provider: Candi Chafe, MD  MRN: [id number]  Delivery: Face-to-face  Specialty: Interventional Pain Management  Type: Established Patient  Setting: Ambulatory outpatient facility  Specialty designation: 09  Referring Prov.: No ref. provider found  Location: Outpatient office facility       HPI  [name], a 58 y.o. year old female, is here today because of her No primary diagnosis found.. [name] primary complain today is No chief complaint on file.  Pertinent problems: [name] has Non-traumatic compression fracture of T1 thoracic vertebra, sequela; Pain in right knee; Carpal tunnel syndrome of right wrist; Median neuropathy; Migraine; Neuropathy; Right radial nerve palsy; Ulnar nerve palsy; Chronic pain syndrome; Dupuytren's disease of palm of hands (Bilateral); Chronic thoracic back pain (1ry area of Pain) (Midline); Thoracic spine pain; Osteopenia determined by x-ray; Left cervical radiculopathy; Right cervical radiculopathy; Cervicalgia; Obstruction of right vertebral artery; Stenosis of right vertebral artery; Ulnar neuritis, left; Arthralgia of both knees; Other spondylosis, thoracic region; Abnormal MRI, thoracic spine (02/16/2024); Abnormal MRI, cervical spine (02/16/2024); Non-traumatic compression fracture of T2 thoracic vertebra, sequela; Non-traumatic compression fracture of T3 thoracic vertebra, sequela; Non-traumatic compression fracture of T4 thoracic vertebra, sequela; Compression of  thoracic vertebra, sequela (T1, T2, T3, T4); DDD (degenerative disc disease), thoracic; Thoracic vertebrogenic pain; Thoracic facet arthropathy; Thoracic facet joint pain; Grade 1 Anterolisthesis of thoracic spine (T2-3); Grade 1 Anterolisthesis of cervical spine (C7-T1); DDD (degenerative disc disease), cervical; and Cervical spondylosis on their pertinent problem list. Pain Assessment: Severity of   is reported as a  /10. Location:    / . Onset:  . Quality:  . Timing:  . Modifying factor(s):  Aaron Aas Vitals:  vitals were not taken for this visit.  BMI: Estimated body mass index is 18.3 kg/m as calculated from the following:   Height as of 03/10/24: 5\' 5"  (1.651 m).   Weight as of 03/10/24: 110 lb (49.9 kg). Last encounter: 01/14/2024. Last procedure: 03/10/2024.  Reason for encounter: post-procedure evaluation and assessment. ***  Discussed the use of AI scribe software for clinical note transcription with the patient, who gave verbal consent to proceed.  History of Present Illness          Post-procedure evaluation   Inter-Laminar Thoracic Epidural Steroid Block/Injection  #2  Laterality:  Left Level: T3-4  Imaging: Fluoroscopic guidance Anesthesia: Local anesthesia (1-2% Lidocaine ) Anxiolysis: IV Versed  3 mg Sedation: Moderate conscious sedation. Fentanyl  100 mcg DOS: 03/10/2024 Performed by: Candi Chafe, MD  Purpose: Diagnostic/Therapeutic Indications: Thoracic back pain, radicular pain, with degenerative disc disease severe enough to impact quality of life or function. 1. Chronic thoracic back pain (1ry area of Pain) (Midline)   2. Thoracic spine pain   3. Abnormal MRI, thoracic spine (02/16/2024)   4. Non-traumatic compression fracture of T2 thoracic vertebra, sequela   5. Non-traumatic compression fracture of T3 thoracic vertebra, sequela   6. Non-traumatic compression fracture of T4 thoracic vertebra, sequela   7. Non-traumatic compression fracture of T1 thoracic vertebra,  sequela   8. Compression of thoracic vertebra, sequela (T1, T2, T3, T4)  9. DDD (degenerative disc disease), thoracic   10. Thoracic vertebrogenic pain   11. Thoracic facet arthropathy   12. Thoracic facet joint pain   13. Grade 1 Anterolisthesis of thoracic spine (T2-3)   14. DDD (degenerative disc disease), cervical    NAS-11 Pain score:   Pre-procedure: 9 /10   Post-procedure: 0-No pain/10    Effectiveness:  Initial hour after procedure:   ***. Subsequent 4-6 hours post-procedure:   ***. Analgesia past initial 6 hours:   ***. Ongoing improvement:  Analgesic:  *** Function:    ***    ROM:    ***      Pharmacotherapy Assessment  Analgesic: No chronic opioid analgesics therapy prescribed by our practice. Tramadol 50 mg tablet, 1 tab p.o. daily (# 30) (30/month) (last filled on 11/28/2023) MME/day: 10 mg/day   Monitoring: [place] PMP: PDMP reviewed during this encounter.       Pharmacotherapy: No side-effects or adverse reactions reported. Compliance: No problems identified. Effectiveness: Clinically acceptable.  No notes on file  No results found for: "CBDTHCR" No results found for: "D8THCCBX" No results found for: "D9THCCBX"  UDS:  Summary  Date Value Ref Range Status  12/07/2023 FINAL  Final    Comment:    ==================================================================== Compliance Drug Analysis, Ur ==================================================================== Specimen Alert Not Detected result may be consistent with the time of last use noted for this medication. AS NEEDED (Tramadol) ==================================================================== Test                             Result       Flag       Units  Drug Present and Declared for Prescription Verification   Alprazolam                      419          EXPECTED   ng/mg creat   Alpha-hydroxyalprazolam        758          EXPECTED   ng/mg creat    Source of alprazolam  is a scheduled  prescription medication. Alpha-    hydroxyalprazolam is an expected metabolite of alprazolam .  Drug Present not Declared for Prescription Verification   Ibuprofen                       PRESENT      UNEXPECTED   Diphenhydramine                 PRESENT      UNEXPECTED  Drug Absent but Declared for Prescription Verification   Tramadol                       Not Detected UNEXPECTED ng/mg creat ==================================================================== Test                      Result    Flag   Units      Ref Range   Creatinine              31               mg/dL      >=40 ==================================================================== Declared Medications:  The flagging and interpretation on this report are based on the  following declared medications.  Unexpected results may arise from  inaccuracies in the declared medications.   **Note: The testing scope of this panel includes these  medications:   Alprazolam  (Xanax )  Tramadol (Ultram)   **Note: The testing scope of this panel does not include the  following reported medications:   Calcium  Estradiol  (Estrace )  Fish Oil  Multivitamin  Supplement  Vitamin B12 ==================================================================== For clinical consultation, please call [phone]. ====================================================================       ROS  Constitutional: Denies any fever or chills Gastrointestinal: No reported hemesis, hematochezia, vomiting, or acute GI distress Musculoskeletal: Denies any acute onset joint swelling, redness, loss of ROM, or weakness Neurological: No reported episodes of acute onset apraxia, aphasia, dysarthria, agnosia, amnesia, paralysis, loss of coordination, or loss of consciousness  Medication Review  ALPRAZolam , Bone Essentials, Fish Oil, Progesterone, calcium carbonate, cyanocobalamin , estradiol , gabapentin , multivitamin, and traMADol  History Review  Allergy: Ms.  Miranda Fleming is allergic to amoxicillin and latex. Drug: [name]  has no history on file for drug use. Alcohol:  reports current alcohol use of about 4.0 standard drinks of alcohol per week. Tobacco:  reports that she quit smoking about 2 years ago. Her smoking use included cigarettes. She started smoking about 43 years ago. She has a 61.6 pack-year smoking history. She has never used smokeless tobacco. Social: [name]  reports that she quit smoking about 2 years ago. Her smoking use included cigarettes. She started smoking about 43 years ago. She has a 61.6 pack-year smoking history. She has never used smokeless tobacco. She reports current alcohol use of about 4.0 standard drinks of alcohol per week. Medical:  has a past medical history of DVT, lower extremity, distal, acute (HCC), Palpitations (11/23/2015), SUI (stress urinary incontinence, female), and Wears glasses. Surgical: [name]  has a past surgical history that includes EXPLORATORY LAPAROTOMY W/  LEFT SALPINGOOPHORECTOMY (01-25-2001); Abdominal hysterectomy (1995); LAPAROSCOPY'S AND LAPAROTOMY'S FOR PERSISTANT OVARIAN CYST (X 5  1987;  1989;  1990;  1993;  1994); and Pubovaginal sling (N/A, 10/31/2013). Family: family history includes Breast cancer in her mother; Colon cancer in her paternal grandfather; Diabetes in her maternal aunt; Healthy in her brother and father; Osteopenia in her sister; Osteoporosis in her mother; Ovarian cancer in her maternal grandmother and mother.  Laboratory Chemistry Profile   Renal Lab Results  Component Value Date   BUN 10 01/10/2024   CREATININE 0.43 (L) 01/10/2024   BCR 19 12/07/2023   GFRAA >60 12/18/2017   GFRNONAA >60 01/10/2024    Hepatic Lab Results  Component Value Date   AST 19 01/10/2024   ALT 22 01/10/2024   ALBUMIN 3.5 01/10/2024   ALKPHOS 45 01/10/2024   LIPASE 35 05/24/2021   AMMONIA <10 01/09/2024    Electrolytes Lab Results  Component Value Date   NA 135 01/10/2024   K  3.1 (L) 01/10/2024   CL 106 01/10/2024   CALCIUM 8.5 (L) 01/10/2024   MG 1.8 01/10/2024    Bone Lab Results  Component Value Date   VD25OH 50.8 12/18/2017   25OHVITD1 68 12/07/2023   25OHVITD2 41 12/07/2023   25OHVITD3 27 12/07/2023    Inflammation (CRP: Acute Phase) (ESR: Chronic Phase) Lab Results  Component Value Date   CRP <1 12/07/2023   ESRSEDRATE 3 12/07/2023   LATICACIDVEN 1.4 01/09/2024         Note: Above Lab results reviewed.  Recent Imaging Review  NCV with EMG(electromyography) Ellene Gustin, MD     03/22/2024  8:58 AM  [facility]  [address] [place], [address]  [place], [place] [address] Tel: [phone] Fax: [phone] Test Date:  03/22/2024  Patient: Romelle Ille DOB: October 28, 1966 Physician: Rommie Coats,  MD  Sex: Female Height: 5\' 5"  Ref Phys: Henderson Lock, MD  ID#: [id number]   Technician:    History: This is a 58 year old female with numbness and tingling in the  left upper limb.  NCV & EMG Findings: Extensive electrodiagnostic evaluation of the left upper limb  with additional nerve conduction studies of the right upper limb  shows: Bilateral ulnar, left median, left radial, and left median-ulnar  palmar sensory responses are within normal limits. Left ulnar (ADM) motor response is borderline in terms of  amplitude with mild slowing of conduction velocity across the  elbow (43 m/s). Right ulnar (ADM) and left median (APB) motor  responses are within normal limits. There is no evidence of active or chronic motor axon loss changes  affecting any of the tested muscles on needle examination. Motor  unit configuration and recruitment pattern is within normal  limits.  Impression: This study shows no definitive abnormalities. 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 No electrodiagnostic evidence of a left cervical (C5-C8) motor  radiculopathy. No electrodiagnostic evidence of a left median mononeuropathy at  or distal to the wrist (ie: carpal tunnel syndrome).  ___________________________ Rommie Coats, MD  Nerve Conduction Studies Motor Nerve Results    Latency Amplitude F-Lat Segment Distance CV Comment  Site (ms) Norm (mV) Norm (ms)  (cm) (m/s) Norm   Left Median (APB) Motor  Wrist 2.3  < 4.0 7.4  > 6.0        Elbow 7.0 - 7.3 -  Elbow-Wrist 26 55  > 50   Left Ulnar (ADM) Motor  Wrist 1.83  < 3.1 7.0  > 7.0 27.8       Bel elbow 5.4 - 6.8 -  Bel elbow-Wrist 20.5 57  > 50   Ab elbow 7.7 - 6.1 -  Ab elbow-Bel elbow 10 43 -   Right Ulnar (ADM) Motor  Wrist 2.1  < 3.1 7.5  > 7.0        Bel elbow 5.7 - 7.0 -  Bel elbow-Wrist 19.5 54  > 50   Ab elbow 7.7 - 6.6 -  Ab elbow-Bel elbow 10 50 -    Sensory Sites    Neg Peak Lat Amplitude (O-P) Segment Distance Velocity Comment  Site (ms) Norm (V) Norm  (cm) (ms)   Left Median Sensory  Wrist-Dig II 3.3  < 3.6 34  > 15 Wrist-Dig II 13    Left Median-Ulnar Palmar Sensory       Median  Palm-Wrist 2.1  < 2.2 138  > 10 Palm-Wrist 8         Ulnar  Palm-Wrist 1.73  < 2.2 63  > 5 Palm-Wrist 8    Left Radial Sensory  Forearm-Wrist 2.2  < 2.7 34  > 14 Forearm-Wrist 10    Left Ulnar Sensory  Wrist-Dig V 2.8  < 3.1 18  > 10 Wrist-Dig V 11    Right Ulnar Sensory  Wrist-Dig V 2.7  < 3.1 21  > 10 Wrist-Dig V 11     Inter-Nerve Comparisons   Nerve 1 Value 1 Nerve 2 Value 2 Parameter Result Normal  Sensory Sites  L Median Palm-Wrist 2.1 ms L Ulnar Palm-Wrist 1.73 ms Peak Lat  Diff 0.37 ms <0.40   Electromyography   Side  Muscle Ins.Act Fibs Fasc Recrt Amp Dur Poly Activation  Comment  Left FDI Nml Nml Nml Nml Nml Nml Nml Nml N/A  Left EIP Nml Nml Nml Nml Nml Nml Nml Nml N/A  Left ADM Nml Nml Nml Nml Nml Nml Nml Nml N/A  Left Pronator teres Nml Nml Nml Nml Nml Nml Nml Nml N/A  Left FDP Nml  Nml Nml Nml Nml Nml Nml Nml N/A  Left Biceps Nml Nml Nml Nml Nml Nml Nml Nml N/A  Left Triceps Nml Nml Nml Nml Nml Nml Nml Nml N/A  Left Deltoid Nml Nml Nml Nml Nml Nml Nml Nml N/A   Waveforms:  Motor        Sensory             F-Wave   Note: Reviewed         Physical Exam  General appearance: Well nourished, well developed, and well hydrated. In no apparent acute distress Mental status: Alert, oriented x 3 (person, place, & time)       Respiratory: No evidence of acute respiratory distress Eyes: PERLA Vitals: There were no vitals taken for this visit. BMI: Estimated body mass index is 18.3 kg/m as calculated from the following:   Height as of 03/10/24: 5\' 5"  (1.651 m).   Weight as of 03/10/24: 110 lb (49.9 kg). Ideal: Ideal body weight: 57 kg (125 lb 10.6 oz)  Assessment   Diagnosis Status  No diagnosis found. Controlled Controlled Controlled   Updated Problems: No problems updated.  Plan of Care  Problem-specific:  Assessment and Plan            Ms. Nyjia Miranda Fleming has a current medication list which includes the following long-term medication(s): calcium carbonate, gabapentin , and progesterone.  Pharmacotherapy (Medications Ordered): No orders of the defined types were placed in this encounter.  Orders:  No orders of the defined types were placed in this encounter.  Follow-up plan:   No follow-ups on file.     Interventional Therapies  Risk Factors  Considerations  Medical Comorbidities:  Allergy: LATEX  Hx. DVT     Planned  Pending:   Diagnostic/therapeutic left (T3-4) thoracic ESI #2    Under consideration:   Diagnostic/therapeutic midline/left (T2-3) thoracic ESI #2    Completed:   Diagnostic/therapeutic midline/left (T2-3) thoracic ESI x1 (12/24/2023) (9-6/10) (100/100/5/RUE:100LUE:90UB:5-10)    Therapeutic  Palliative (PRN) options:   None established   Completed by other providers:   None reported     Recent  Visits Date Type Provider Dept  03/10/24 Procedure visit Renaldo Caroli, MD [facility]  01/14/24 Office Visit Renaldo Caroli, MD [facility]  12/24/23 Procedure visit Renaldo Caroli, MD [facility]  Showing recent visits within past 90 days and meeting all other requirements Future Appointments Date Type Provider Dept  03/23/24 Appointment Renaldo Caroli, MD [facility]  Showing future appointments within next 90 days and meeting all other requirements   I discussed the assessment and treatment plan with the patient. The patient was provided an opportunity to ask questions and all were answered. The patient agreed with the plan and demonstrated an understanding of the instructions.  Patient advised to call back or seek an in-person evaluation if the symptoms or condition worsens.  Duration of encounter: *** minutes.  Total time on encounter, as per AMA guidelines included both the face-to-face and non-face-to-face time personally spent by the physician and/or other qualified health care professional(s) on the day of the  encounter (includes time in activities that require the physician or other qualified health care professional and does not include time in activities normally performed by clinical staff). Physician's time may include the following activities when performed: Preparing to see the patient (e.g., pre-charting review of records, searching for previously ordered imaging, lab work, and nerve conduction tests) Review of prior analgesic pharmacotherapies. Reviewing PMP Interpreting ordered tests (e.g., lab work, imaging, nerve conduction tests) Performing post-procedure evaluations, including interpretation of diagnostic procedures Obtaining and/or reviewing separately obtained history Performing a medically appropriate examination and/or evaluation Counseling and educating the patient/family/caregiver Ordering medications,  tests, or procedures Referring and communicating with other health care professionals (when not separately reported) Documenting clinical information in the electronic or other health record Independently interpreting results (not separately reported) and communicating results to the patient/ family/caregiver Care coordination (not separately reported)  Note by: Candi Chafe, MD (TTS and AI technology used. I apologize for any typographical errors that were not detected and corrected.) Date: 03/23/2024; Time: 10:46 AM

## 2024-03-22 NOTE — Procedures (Signed)
  [facility]  [address] [place], [address]  [place], [place] [address] Tel: [phone] Fax: [phone] Test Date:  03/22/2024  Patient: Miranda Fleming DOB: 1966/01/14 Physician: Rommie Coats, MD  Sex: Female Height: 5\' 5"  Ref Phys: Henderson Lock, MD  ID#: [id number]   Technician:    History: This is a 58 year old female with numbness and tingling in the left upper limb.  Findings: High frequency (4.0-16.0 MHz) B-mode, nonvascular ultrasound of the left upper limb shows: Cross sectional areas (CSA) of left ulnar (wrist to mid upper arm) nerve are within normal limits. There is subluxation but no dislocation of left ulnar nerve from the ulnar groove with maximal elbow flexion.  Impression: This is a normal neuromuscular ultrasound. Specifically, there is no ultrasonographic evidence of entrapment/repetitive nerve trauma of the left ulnar nerve. No other obvious lesion involving the adjacent bone or tendon is identified. No definite vascular abnormalities.   Nerve Measurements   Site Area Segment Area Ratio Mobility Vascularity Comment   mm Norm   Norm     Left Ulnar  Wrist 2.6  < 10.0         Forearm 3.6  < 10.0  Elbow - Forearm 1.58      Distal elbow 4.6  < 10.0         Elbow 5.7  < 10.0         Proximal elbow 4.8  < 10.0         Mid-arm 3.1  < 10.0          Ultrasound Images:

## 2024-03-22 NOTE — Procedures (Signed)
 [facility]  [address] [place], [address]  [place], [place] [address] Tel: [phone] Fax: [phone] Test Date:  03/22/2024  Patient: Miranda Fleming DOB: 1966/04/30 Physician: Rommie Coats, MD  Sex: Female Height: 5\' 5"  Ref Phys: Henderson Lock, MD  ID#: [id number]   Technician:    History: This is a 58 year old female with numbness and tingling in the left upper limb.  NCV & EMG Findings: Extensive electrodiagnostic evaluation of the left upper limb with additional nerve conduction studies of the right upper limb shows: Bilateral ulnar, left median, left radial, and left median-ulnar palmar sensory responses are within normal limits. Left ulnar (ADM) motor response is borderline in terms of amplitude with mild slowing of conduction velocity across the elbow (43 m/s). Right ulnar (ADM) and left median (APB) motor responses are within normal limits. There is no evidence of active or chronic motor axon loss changes affecting any of the tested muscles on needle examination. Motor unit configuration and recruitment pattern is within normal limits.  Impression: This study shows no definitive abnormalities. 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 No electrodiagnostic evidence of a left cervical (C5-C8) motor radiculopathy. No electrodiagnostic evidence of a left median mononeuropathy at or distal to the wrist (ie: carpal tunnel syndrome).    ___________________________ Rommie Coats, MD    Nerve Conduction Studies Motor Nerve Results    Latency Amplitude F-Lat Segment Distance CV Comment  Site (ms) Norm (mV) Norm (ms)  (cm) (m/s) Norm   Left Median (APB) Motor  Wrist 2.3  < 4.0 7.4  > 6.0        Elbow 7.0 - 7.3 -  Elbow-Wrist 26 55  > 50   Left Ulnar (ADM) Motor  Wrist 1.83  < 3.1 7.0  > 7.0 27.8        Bel elbow 5.4 - 6.8 -  Bel elbow-Wrist 20.5 57  > 50   Ab elbow 7.7 - 6.1 -  Ab elbow-Bel elbow 10 43 -   Right Ulnar (ADM) Motor  Wrist 2.1  < 3.1 7.5  > 7.0        Bel elbow 5.7 - 7.0 -  Bel elbow-Wrist 19.5 54  > 50   Ab elbow 7.7 - 6.6 -  Ab elbow-Bel elbow 10 50 -    Sensory Sites    Neg Peak Lat Amplitude (O-P) Segment Distance Velocity Comment  Site (ms) Norm (V) Norm  (cm) (ms)   Left Median Sensory  Wrist-Dig II 3.3  < 3.6 34  > 15 Wrist-Dig II 13    Left Median-Ulnar Palmar Sensory       Median  Palm-Wrist 2.1  < 2.2 138  > 10 Palm-Wrist 8         Ulnar  Palm-Wrist 1.73  < 2.2 63  > 5 Palm-Wrist 8    Left Radial Sensory  Forearm-Wrist 2.2  < 2.7 34  > 14 Forearm-Wrist 10    Left Ulnar Sensory  Wrist-Dig V 2.8  < 3.1 18  > 10 Wrist-Dig V 11    Right Ulnar Sensory  Wrist-Dig V 2.7  < 3.1 21  > 10 Wrist-Dig V 11     Inter-Nerve Comparisons   Nerve 1 Value 1 Nerve 2 Value 2 Parameter Result Normal  Sensory Sites  L Median Palm-Wrist 2.1 ms L Ulnar Palm-Wrist 1.73 ms Peak  Lat Diff 0.37 ms <0.40   Electromyography   Side Muscle Ins.Act Fibs Fasc Recrt Amp Dur Poly Activation Comment  Left FDI Nml Nml Nml Nml Nml Nml Nml Nml N/A  Left EIP Nml Nml Nml Nml Nml Nml Nml Nml N/A  Left ADM Nml Nml Nml Nml Nml Nml Nml Nml N/A  Left Pronator teres Nml Nml Nml Nml Nml Nml Nml Nml N/A  Left FDP Nml Nml Nml Nml Nml Nml Nml Nml N/A  Left Biceps Nml Nml Nml Nml Nml Nml Nml Nml N/A  Left Triceps Nml Nml Nml Nml Nml Nml Nml Nml N/A  Left Deltoid Nml Nml Nml Nml Nml Nml Nml Nml N/A      Waveforms:  Motor        Sensory             F-Wave

## 2024-03-23 ENCOUNTER — Encounter: Payer: Self-pay | Admitting: Pain Medicine

## 2024-03-23 ENCOUNTER — Ambulatory Visit: Attending: Pain Medicine | Admitting: Pain Medicine

## 2024-03-23 VITALS — BP 111/63 | HR 85 | Temp 97.0°F | Resp 16 | Ht 65.0 in | Wt 110.0 lb

## 2024-03-23 DIAGNOSIS — G8929 Other chronic pain: Secondary | ICD-10-CM | POA: Diagnosis not present

## 2024-03-23 DIAGNOSIS — S22000S Wedge compression fracture of unspecified thoracic vertebra, sequela: Secondary | ICD-10-CM | POA: Insufficient documentation

## 2024-03-23 DIAGNOSIS — M546 Pain in thoracic spine: Secondary | ICD-10-CM | POA: Insufficient documentation

## 2024-03-23 DIAGNOSIS — Z09 Encounter for follow-up examination after completed treatment for conditions other than malignant neoplasm: Secondary | ICD-10-CM | POA: Insufficient documentation

## 2024-03-23 NOTE — Patient Instructions (Signed)

## 2024-03-28 ENCOUNTER — Ambulatory Visit: Admitting: Neurosurgery

## 2024-03-28 DIAGNOSIS — S22020D Wedge compression fracture of second thoracic vertebra, subsequent encounter for fracture with routine healing: Secondary | ICD-10-CM | POA: Diagnosis not present

## 2024-03-28 DIAGNOSIS — W19XXXD Unspecified fall, subsequent encounter: Secondary | ICD-10-CM | POA: Diagnosis not present

## 2024-03-28 DIAGNOSIS — S22000D Wedge compression fracture of unspecified thoracic vertebra, subsequent encounter for fracture with routine healing: Secondary | ICD-10-CM | POA: Insufficient documentation

## 2024-03-28 DIAGNOSIS — S22030D Wedge compression fracture of third thoracic vertebra, subsequent encounter for fracture with routine healing: Secondary | ICD-10-CM | POA: Diagnosis not present

## 2024-03-28 DIAGNOSIS — S22010D Wedge compression fracture of first thoracic vertebra, subsequent encounter for fracture with routine healing: Secondary | ICD-10-CM

## 2024-03-28 NOTE — Progress Notes (Signed)
 I had a follow up phone visit with Miranda Fleming today. She was at home and I was in the office.   Scheduled for another shot with Dr. Naviera next week, had a prior injection got some relief on the second injection. Will get her third for her 6 months.    Has worn a heelbo for suspected ulnar neuropathy, but had a recent evalutaion with very mild ulnar assymetry.  Ortho for elbow   Narrative & Impression  CLINICAL DATA:  Provided history: Obstruction of right vertebral artery. Stenosis of right vertebral artery. Right vertebral artery void on MRI.   EXAM: MRA NECK WITHOUT AND WITH CONTRAST   TECHNIQUE: Multiplanar and multiecho pulse sequences of the neck were obtained without and with intravenous contrast. Angiographic images of the neck were obtained using MRA technique without and with intravenous contrast.   CONTRAST:  5mL GADAVIST  GADOBUTROL  1 MMOL/ML IV SOLN   COMPARISON:  None.   FINDINGS: Aortic arch: Common origin of the innominate and left common carotid arteries. The visualized thoracic aorta is normal in caliber. No hemodynamically significant innominate or proximal subclavian artery stenosis.   Right carotid system: CCA and ICA patent within the neck without stenosis.   Left carotid system: CCA and ICA patent within the neck without stenosis.   Vertebral arteries: The right vertebral artery is non-dominant and developmentally diminutive, but patent throughout the neck. Artifactual stenosis within the distal cervical right vertebral artery on MIP images (when correlating with the post-contrast source sequence). The left vertebral artery is patent within the neck without stenosis.   IMPRESSION: 1. The right vertebral artery is non-dominant and developmentally diminutive, but patent throughout the neck. 2. The common carotid, internal carotid and left vertebral arteries are patent within the neck without stenosis.     Electronically Signed   By: Bascom Lily D.O.   On: 03/23/2024 13:57   Her MRI is as above with a diminutive but patient vertebral artery.   She had T1-T3 superior endplate fractures treated conservatively.  Continues to have pain and discomfort.  Has had thoracic injections.  She was originally working with physical therapy but stopped due to left upper extremity issues, worked up by neurology not found to have a clear neuropathic reason for her symptoms.  At this point I will make a referral to orthopedic surgery to evaluate her elbow and shoulder for any possible bony reasons for her discomfort, at this point her endplate fractures are healed and do not need any surgical fixation.  I let her know that she should reestablish care with her physical therapist and if she needs a capacity evaluation to be evaluated for that through the therapy team.  I did offer her referrals for neurology for her left-sided symptoms as well as to vascular neurosurgery for her MRI findings as she was concerned about the right vertebral artery.  I spent a total of 20 minutes on this call

## 2024-04-06 NOTE — Progress Notes (Unsigned)
 PROVIDER NOTE: Interpretation of information contained herein should be left to medically-trained personnel. Specific patient instructions are provided elsewhere under "Patient Instructions" section of medical record. This document was created in part using STT-dictation technology, any transcriptional errors that may result from this process are unintentional.  Patient: Miranda Fleming Type: Established DOB: 03-15-1966 MRN: [id number] PCP: System, Provider Not In  Service: Procedure DOS: 04/07/2024 Setting: Ambulatory Location: Ambulatory outpatient facility Delivery: Face-to-face Provider: Candi Chafe, MD Specialty: Interventional Pain Management Specialty designation: 09 Location: Outpatient facility Ref. Prov.: No ref. provider found       Interventional Therapy   Inter-Laminar Thoracic Epidural Steroid Block/Injection  #3  Laterality: Left Level: T3-4  DOS: 04/07/2024 Performed by: Candi Chafe, MD Imaging: Fluoroscopic guidance Anesthesia: Local anesthesia (1-2% Lidocaine ) Anxiolysis: IV Versed  2.0 mg Sedation: Moderate Sedation None required. No Fentanyl  administered.         Purpose: Diagnostic/Therapeutic Indications: Thoracic back pain, radicular pain, with degenerative disc disease severe enough to impact quality of life or function. 1. Chronic thoracic back pain (1ry area of Pain) (Midline)   2. Compression of thoracic vertebra, sequela (T1, T2, T3, T4)   3. DDD (degenerative disc disease), thoracic   4. Grade 1 Anterolisthesis of thoracic spine (T2-3)   5. Non-traumatic compression fracture of T1 thoracic vertebra, sequela   6. Non-traumatic compression fracture of T2 thoracic vertebra, sequela   7. Non-traumatic compression fracture of T3 thoracic vertebra, sequela   8. Non-traumatic compression fracture of T4 thoracic vertebra, sequela   9. Thoracic facet joint pain   10. Thoracic spine pain   11. Thoracic vertebrogenic pain   12. Latex  precautions, history of latex allergy    NAS-11 Pain score:   Pre-procedure: 7 /10   Post-procedure: 8 /10      Position / Prep / Materials: Position: Prone  Prep solution: ChloraPrep (2% chlorhexidine gluconate and 70% isopropyl alcohol) Prep Area: Posterior Thoracolumbar (Upper back from shoulders to lower lumbar region).  Materials:  Tray: Epidural Needle(s) Type: Epidural needle Gauge (G): 17 Length: Regular (3.5-in) Qty: 1  H&P (Pre-op Assessment):  [name] is a 58 y.o. (year old), female patient, seen today for interventional treatment. She  has a past surgical history that includes EXPLORATORY LAPAROTOMY W/  LEFT SALPINGOOPHORECTOMY (01-25-2001); Abdominal hysterectomy (1995); LAPAROSCOPY'S AND LAPAROTOMY'S FOR PERSISTANT OVARIAN CYST (X 5  1987;  1989;  1990;  1993;  1994); and Pubovaginal sling (N/A, 10/31/2013). [name] has a current medication list which includes the following prescription(s): alendronate, alprazolam , calcium carbonate, cyanocobalamin , estradiol , gabapentin , bone essentials, multivitamin, fish oil, progesterone, and tramadol, and the following Facility-Administered Medications: fentanyl  and pentafluoroprop-tetrafluoroeth. Her primarily concern today is the Back Pain  Initial Vital Signs:  Pulse/HCG Rate: 88ECG Heart Rate: 78 Temp: (!) 97.2 F (36.2 C) Resp: 16 BP: 120/77 SpO2: 99 %  BMI: Estimated body mass index is 18.3 kg/m as calculated from the following:   Height as of this encounter: 5\' 5"  (1.651 m).   Weight as of this encounter: 110 lb (49.9 kg).  Risk Assessment: Allergies: Reviewed. She is allergic to amoxicillin and latex.  Allergy Precautions: None required Coagulopathies: Reviewed. None identified.  Blood-thinner therapy: None at this time Active Infection(s): Reviewed. None identified. [name] is afebrile  Site Confirmation: Ms. Jepsen was asked to confirm the procedure and laterality before marking the site Procedure  checklist: Completed Consent: Before the procedure and under the influence of no sedative(s), amnesic(s), or anxiolytics, the patient was informed  of the treatment options, risks and possible complications. To fulfill our ethical and legal obligations, as recommended by the American Medical Association's Code of Ethics, I have informed the patient of my clinical impression; the nature and purpose of the treatment or procedure; the risks, benefits, and possible complications of the intervention; the alternatives, including doing nothing; the risk(s) and benefit(s) of the alternative treatment(s) or procedure(s); and the risk(s) and benefit(s) of doing nothing. The patient was provided information about the general risks and possible complications associated with the procedure. These may include, but are not limited to: failure to achieve desired goals, infection, bleeding, organ or nerve damage, allergic reactions, paralysis, and death. In addition, the patient was informed of those risks and complications associated to Spine-related procedures, such as failure to decrease pain; infection (i.e.: Meningitis, epidural or intraspinal abscess); bleeding (i.e.: epidural hematoma, subarachnoid hemorrhage, or any other type of intraspinal or peri-dural bleeding); organ or nerve damage (i.e.: Any type of peripheral nerve, nerve root, or spinal cord injury) with subsequent damage to sensory, motor, and/or autonomic systems, resulting in permanent pain, numbness, and/or weakness of one or several areas of the body; allergic reactions; (i.e.: anaphylactic reaction); and/or death. Furthermore, the patient was informed of those risks and complications associated with the medications. These include, but are not limited to: allergic reactions (i.e.: anaphylactic or anaphylactoid reaction(s)); adrenal axis suppression; blood sugar elevation that in diabetics may result in ketoacidosis or comma; water  retention that in patients  with history of congestive heart failure may result in shortness of breath, pulmonary edema, and decompensation with resultant heart failure; weight gain; swelling or edema; medication-induced neural toxicity; particulate matter embolism and blood vessel occlusion with resultant organ, and/or nervous system infarction; and/or aseptic necrosis of one or more joints. Finally, the patient was informed that Medicine is not an exact science; therefore, there is also the possibility of unforeseen or unpredictable risks and/or possible complications that may result in a catastrophic outcome. The patient indicated having understood very clearly. We have given the patient no guarantees and we have made no promises. Enough time was given to the patient to ask questions, all of which were answered to the patient's satisfaction. Ms. Rane has indicated that she wanted to continue with the procedure. Attestation: I, the ordering provider, attest that I have discussed with the patient the benefits, risks, side-effects, alternatives, likelihood of achieving goals, and potential problems during recovery for the procedure that I have provided informed consent. Date  Time: 04/07/2024 10:24 AM  Pre-Procedure Preparation:  Monitoring: As per clinic protocol. Respiration, ETCO2, SpO2, BP, heart rate and rhythm monitor placed and checked for adequate function Safety Precautions: Patient was assessed for positional comfort and pressure points before starting the procedure. Time-out: I initiated and conducted the "Time-out" before starting the procedure, as per protocol. The patient was asked to participate by confirming the accuracy of the "Time Out" information. Verification of the correct person, site, and procedure were performed and confirmed by me, the nursing staff, and the patient. "Time-out" conducted as per Joint Commission's Universal Protocol (UP.01.01.01). Time: 1150 Start Time: 1150 hrs.  Description of  Procedure:         Target Area: For Epidural Steroid injection(s), the target area is the  interlaminar space, initially targeting the lower border of the superior vertebral body lamina. Approach: Interlaminar approach. Area Prepped: Entire Posterior Thoracolumbar Region ChloraPrep (2% chlorhexidine gluconate and 70% isopropyl alcohol) Safety Precautions: Aspiration looking for blood return was conducted prior to all  injections. At no point did we inject any substances, as a needle was being advanced. No attempts were made at seeking any paresthesias. Safe injection practices and needle disposal techniques used. Medications properly checked for expiration dates. SDV (single dose vial) medications used. Description of the Procedure: Protocol guidelines were followed. The patient was placed in position over the fluoroscopy table. The target area was identified and the area prepped in the usual manner. Skin & deeper tissues infiltrated with local anesthetic. Appropriate amount of time allowed to pass for local anesthetics to take effect. The procedure needles were then advanced to the target area. The inferior aspect of the superior lamina was contacted and the needle walked caudad, until the lamina was cleared. The epidural space was identified using "loss-of-resistance technique" with 0.9% PF-NSS (2-107mL), in a low friction 10cc LOR glass syringe. Proper needle placement was secured. Negative aspiration confirmed. Solution injected in intermittent fashion, asking for systemic symptoms every 0.5 cc of injectate. The needles were then removed and the area cleansed, making sure to leave some of the prepping solution behind to take advantage of its long term bactericidal properties. Vitals:   04/07/24 1152 04/07/24 1156 04/07/24 1159 04/07/24 1212  BP: 131/72 118/72 124/78 108/82  Pulse:      Resp: 17 17 (!) 23 16  Temp:      SpO2: 100% 100% 100% 100%  Weight:      Height:        Start Time: 1150  hrs. End Time: 1159 hrs.  Imaging Guidance (Spinal):          Type of Imaging Technique: Fluoroscopy Guidance (Spinal) Indication(s): Fluoroscopy guidance for needle placement to enhance accuracy in procedures requiring precise needle localization for targeted delivery of medication in or near specific anatomical locations not easily accessible without such real-time imaging assistance. Exposure Time: Please see nurses notes. Contrast: Before injecting any contrast, we confirmed that the patient did not have an allergy to iodine, shellfish, or radiological contrast. Once satisfactory needle placement was completed at the desired level, radiological contrast was injected. Contrast injected under live fluoroscopy. No contrast complications. See chart for type and volume of contrast used. Fluoroscopic Guidance: I was personally present during the use of fluoroscopy. "Tunnel Vision Technique" used to obtain the best possible view of the target area. Parallax error corrected before commencing the procedure. "Direction-depth-direction" technique used to introduce the needle under continuous pulsed fluoroscopy. Once target was reached, antero-posterior, oblique, and lateral fluoroscopic projection used confirm needle placement in all planes. Images permanently stored in EMR. Interpretation: I personally interpreted the imaging intraoperatively. Adequate needle placement confirmed in multiple planes. Appropriate spread of contrast into desired area was observed. No evidence of afferent or efferent intravascular uptake. No intrathecal or subarachnoid spread observed. Permanent images saved into the patient's record.  Antibiotic Prophylaxis:   Anti-infectives (From admission, onward)    None      Indication(s): None identified   Post-operative Assessment:  Post-procedure Vital Signs:  Pulse/HCG Rate: 8877 Temp: (!) 97.2 F (36.2 C) Resp: 16 BP: 108/82 SpO2: 100 %  EBL: None  Complications: No  immediate post-treatment complications observed by team, or reported by patient.  Note: The patient tolerated the entire procedure well. A repeat set of vitals were taken after the procedure and the patient was kept under observation following institutional policy, for this type of procedure. Post-procedural neurological assessment was performed, showing return to baseline, prior to discharge. The patient was provided with post-procedure discharge instructions, including a  section on how to identify potential problems. Should any problems arise concerning this procedure, the patient was given instructions to immediately contact us , at any time, without hesitation. In any case, we plan to contact the patient by telephone for a follow-up status report regarding this interventional procedure.  Comments:  No additional relevant information.  Plan of Care (POC)  Orders:  Orders Placed This Encounter  Procedures   Thoracic Epidural Injection    Scheduling Instructions:     Side: Left-sided     Sedation: With Sedation.     Timeframe: Today    Where will this procedure be performed?:   [facility]   DG [facility] C-ARM 1-60 MIN NO REPORT    Intraoperative interpretation by procedural physician at [facility].    Standing Status:   Standing    Number of Occurrences:   1    Reason for exam::   Assistance in needle guidance and placement for procedures requiring needle placement in or near specific anatomical locations not easily accessible without such assistance.   Informed Consent Details: Physician/Practitioner Attestation; Transcribe to consent form and obtain patient signature    Provider Attestation: I, Avarose Mervine A. Barth Borne, MD, (Pain Management Specialist), the physician/practitioner, attest that I have discussed with the patient the benefits, risks, side effects, alternatives, likelihood of achieving goals and potential problems during recovery for the procedure that I have  provided informed consent.    Scheduling Instructions:     Nursing Order: Transcribe to consent form and obtain patient signature.     Note: Always confirm laterality of pain with Ms. Cobern, before procedure.    Physician/Practitioner attestation of informed consent for procedure/surgical case:   I, the physician/practitioner, attest that I have discussed with the patient the benefits, risks, side effects, alternatives, likelihood of achieving goals and potential problems during recovery for the procedure that I have provided informed consent.    Procedure:   Thoracic Epidural Steroid Injection/Block under fluoroscopic guidance    Physician/Practitioner performing the procedure:   Jahzir Strohmeier A. Barth Borne MD    Indication/Reason:   Upper (Thoracic) Back Pain with or without Thoracic Radicular Pain (Rib/Flank pain) secondary to Thoracic Degenerative Disc Disease and/or Thoracic Intervertebral Disc Displacement, with or without Thoracic Radiculitis/Radiculopathy.   Provide equipment / supplies at bedside    Procedural tray: Epidural Tray (Disposable  single use) Skin infiltration needle: Regular 1.5-in, 25-G, (x1) Block needle size: Regular standard Catheter: No catheter required    Standing Status:   Standing    Number of Occurrences:   1    Specify:   Epidural Tray   Saline lock IV    Have LR [id number] mL available and administer at 125 mL/hr if patient becomes hypotensive.    Standing Status:   Standing    Number of Occurrences:   1   Latex precautions    Activate Latex-Free Protocol.    Standing Status:   Standing    Number of Occurrences:   1    Chronic Opioid Analgesic: No chronic opioid analgesics therapy prescribed by our practice. Tramadol 50 mg tablet, 1 tab p.o. daily (# 30) (30/month) (last filled on 11/28/2023) MME/day: 10 mg/day    Medications ordered for procedure: Meds ordered this encounter  Medications   iohexol  (OMNIPAQUE ) 180 MG/ML injection 10 mL    Must be  Myelogram-compatible. If not available, you may substitute with a water -soluble, non-ionic, hypoallergenic, myelogram-compatible radiological contrast medium.   lidocaine  (XYLOCAINE ) 2 % (with pres) injection 400  mg   pentafluoroprop-tetrafluoroeth (GEBAUERS) aerosol   midazolam  (VERSED ) 5 MG/5ML injection 0.5-2 mg    Make sure Flumazenil is available in the pyxis when using this medication. If oversedation occurs, administer 0.2 mg IV over 15 sec. If after 45 sec no response, administer 0.2 mg again over 1 min; may repeat at 1 min intervals; not to exceed 4 doses (1 mg)   fentaNYL  (SUBLIMAZE ) injection 25-50 mcg    Make sure Narcan is available in the pyxis when using this medication. In the event of respiratory depression (RR< 8/min): Titrate NARCAN (naloxone) in increments of 0.1 to 0.2 mg IV at 2-3 minute intervals, until desired degree of reversal.   sodium chloride  flush (NS) 0.9 % injection 2 mL   ropivacaine  (PF) 2 mg/mL (0.2%) (NAROPIN ) injection 2 mL   dexamethasone  (DECADRON ) injection 10 mg   Medications administered: We administered iohexol , lidocaine , midazolam , sodium chloride  flush, ropivacaine  (PF) 2 mg/mL (0.2%), and dexamethasone .  See the medical record for exact dosing, route, and time of administration.  Follow-up plan:   Return in about 2 weeks (around 04/21/2024) for (Face2F), (PPE).       Interventional Therapies  Risk Factors  Considerations  Medical Comorbidities:  Allergy: LATEX  Hx. DVT     Planned  Pending:   Diagnostic/therapeutic left (T3-4) thoracic ESI #2    Under consideration:   Diagnostic/therapeutic midline/left (T2-3) thoracic ESI #2    Completed:   Diagnostic/therapeutic midline/left (T2-3) thoracic ESI x1 (12/24/2023) (9-6/10) (100/100/5/RUE:100LUE:90UB:5-10)    Therapeutic  Palliative (PRN) options:   None established   Completed by other providers:   None reported      Recent Visits Date Type Provider Dept  03/23/24 Office  Visit Renaldo Caroli, MD [facility]  03/10/24 Procedure visit Renaldo Caroli, MD [facility]  01/14/24 Office Visit Renaldo Caroli, MD [facility]  Showing recent visits within past 90 days and meeting all other requirements Today's Visits Date Type Provider Dept  04/07/24 Procedure visit Renaldo Caroli, MD [facility]  Showing today's visits and meeting all other requirements Future Appointments Date Type Provider Dept  05/11/24 Appointment Renaldo Caroli, MD [facility]  Showing future appointments within next 90 days and meeting all other requirements  Disposition: Discharge home  Discharge (Date  Time): 04/07/2024; 1215 hrs.   Primary Care Physician: System, Provider Not In Location: [facility] Note by: Candi Chafe, MD (TTS technology used. I apologize for any typographical errors that were not detected and corrected.) Date: 04/07/2024; Time: 12:51 PM  Disclaimer:  Medicine is not an Visual merchandiser. The only guarantee in medicine is that nothing is guaranteed. It is important to note that the decision to proceed with this intervention was based on the information collected from the patient. The Data and conclusions were drawn from the patient's questionnaire, the interview, and the physical examination. Because the information was provided in large part by the patient, it cannot be guaranteed that it has not been purposely or unconsciously manipulated. Every effort has been made to obtain as much relevant data as possible for this evaluation. It is important to note that the conclusions that lead to this procedure are derived in large part from the available data. Always take into account that the treatment will also be dependent on availability of resources and existing treatment guidelines, considered by other Pain Management Practitioners as being common knowledge and practice, at  the time of the intervention. For Medico-Legal purposes,  it is also important to point out that variation in procedural techniques and pharmacological choices are the acceptable norm. The indications, contraindications, technique, and results of the above procedure should only be interpreted and judged by a Board-Certified Interventional Pain Specialist with extensive familiarity and expertise in the same exact procedure and technique.

## 2024-04-06 NOTE — Patient Instructions (Incomplete)
 Post-Procedure Discharge Instructions  Instructions: Apply ice:  Purpose: This will minimize any swelling and discomfort after procedure.  When: Day of procedure, as soon as you get home. How: Fill a plastic sandwich bag with crushed ice. Cover it with a small towel and apply to injection site. How long: (15 min on, 15 min off) Apply for 15 minutes then remove x 15 minutes.  Repeat sequence on day of procedure, until you go to bed. Apply heat:  Purpose: To treat any soreness and discomfort from the procedure. When: Starting the next day after the procedure. How: Apply heat to procedure site starting the day following the procedure. How long: May continue to repeat daily, until discomfort goes away. Food intake: Start with clear liquids (like water) and advance to regular food, as tolerated.  Physical activities: Keep activities to a minimum for the first 8 hours after the procedure. After that, then as tolerated. Driving: If you have received any sedation, be responsible and do not drive. You are not allowed to drive for 24 hours after having sedation. Blood thinner: (Applies only to those taking blood thinners) You may restart your blood thinner 6 hours after your procedure. Insulin: (Applies only to Diabetic patients taking insulin) As soon as you can eat, you may resume your normal dosing schedule. Infection prevention: Keep procedure site clean and dry. Shower daily and clean area with soap and water. Post-procedure Pain Diary: Extremely important that this be done correctly and accurately. Recorded information will be used to determine the next step in treatment. For the purpose of accuracy, follow these rules: Evaluate only the area treated. Do not report or include pain from an untreated area. For the purpose of this evaluation, ignore all other areas of pain, except for the treated area. After your procedure, avoid taking a long nap and attempting to complete the pain diary after you  wake up. Instead, set your alarm clock to go off every hour, on the hour, for the initial 8 hours after the procedure. Document the duration of the numbing medicine, and the relief you are getting from it. Do not go to sleep and attempt to complete it later. It will not be accurate. If you received sedation, it is likely that you were given a medication that may cause amnesia. Because of this, completing the diary at a later time may cause the information to be inaccurate. This information is needed to plan your care. Follow-up appointment: Keep your post-procedure follow-up evaluation appointment after the procedure (usually 2 weeks for most procedures, 6 weeks for radiofrequencies). DO NOT FORGET to bring you pain diary with you.   Expect: (What should I expect to see with my procedure?) From numbing medicine (AKA: Local Anesthetics): Numbness or decrease in pain. You may also experience some weakness, which if present, could last for the duration of the local anesthetic. Onset: Full effect within 15 minutes of injected. Duration: It will depend on the type of local anesthetic used. On the average, 1 to 8 hours.  From steroids (Applies only if steroids were used): Decrease in swelling or inflammation. Once inflammation is improved, relief of the pain will follow. Onset of benefits: Depends on the amount of swelling present. The more swelling, the longer it will take for the benefits to be seen. In some cases, up to 10 days. Duration: Steroids will stay in the system x 2 weeks. Duration of benefits will depend on multiple posibilities including persistent irritating factors. Side-effects: If present, they may typically  last 2 weeks (the duration of the steroids). Frequent: Cramps (if they occur, drink Gatorade and take over-the-counter Magnesium 450-500 mg once to twice a day); water retention with temporary weight gain; increases in blood sugar; decreased immune system response; increased  appetite. Occasional: Facial flushing (red, warm cheeks); mood swings; menstrual changes. Uncommon: Long-term decrease or suppression of natural hormones; bone thinning. (These are more common with higher doses or more frequent use. This is why we prefer that our patients avoid having any injection therapies in other practices.)  Very Rare: Severe mood changes; psychosis; aseptic necrosis. From procedure: Some discomfort is to be expected once the numbing medicine wears off. This should be minimal if ice and heat are applied as instructed.  Call if: (When should I call?) You experience numbness and weakness that gets worse with time, as opposed to wearing off. New onset bowel or bladder incontinence. (Applies only to procedures done in the spine)  Emergency Numbers: Durning business hours (Monday - Thursday, 8:00 AM - 4:00 PM) (Friday, 9:00 AM - 12:00 Noon): [phone] After hours: [phone] NOTE: If you are having a problem and are unable connect with, or to talk to a provider, then go to your nearest urgent care or emergency department. If the problem is serious and urgent, please call 911.  Moderate Conscious Sedation, Adult, Care After After the procedure, it is common to have: Sleepiness for a few hours. Impaired judgment for a few hours. Trouble with balance. Nausea or vomiting if you eat too soon. Follow these instructions at home: For the time period you were told by your health care provider:  Rest. Do not participate in activities where you could fall or become injured. Do not drive or use machinery. Do not drink alcohol. Do not take sleeping pills or medicines that cause drowsiness. Do not make important decisions or sign legal documents. Do not take care of children on your own. Eating and drinking Follow instructions from your health care provider about what you may eat and drink. Drink enough fluid to keep your urine pale yellow. If you vomit: Drink clear  fluids slowly and in small amounts as you are able. Clear fluids include water, ice chips, low-calorie sports drinks, and fruit juice that has water added to it (diluted fruit juice). Eat light and bland foods in small amounts as you are able. These foods include bananas, applesauce, rice, lean meats, toast, and crackers. General instructions Take over-the-counter and prescription medicines only as told by your health care provider. Have a responsible adult stay with you for the time you are told. Do not use any products that contain nicotine or tobacco. These products include cigarettes, chewing tobacco, and vaping devices, such as e-cigarettes. If you need help quitting, ask your health care provider. Return to your normal activities as told by your health care provider. Ask your health care provider what activities are safe for you. Your health care provider may give you more instructions. Make sure you know what you can and cannot do.   Contact a health care provider if: You are still sleepy or having trouble with balance after 24 hours. You feel light-headed. You vomit every time you eat or drink. You get a rash. You have a fever. You have redness or swelling around the IV site. Get help right away if: You have trouble breathing. You start to feel confused at home. These symptoms may be an emergency. Get help right away. Call 911. Do not wait to see  if the symptoms will go away. Do not drive yourself to the hospital. This information is not intended to replace advice given to you by your health care provider. Make sure you discuss any questions you have with your health care provider. Document Revised: 05/12/2022 Document Reviewed: 05/12/2022 Elsevier Patient Education  2024 [employer].

## 2024-04-07 ENCOUNTER — Encounter: Payer: Self-pay | Admitting: Pain Medicine

## 2024-04-07 ENCOUNTER — Ambulatory Visit
Admission: RE | Admit: 2024-04-07 | Discharge: 2024-04-07 | Disposition: A | Discharge status: Home / Self Care | Source: Ambulatory Visit | Attending: Pain Medicine | Admitting: Pain Medicine

## 2024-04-07 ENCOUNTER — Ambulatory Visit: Attending: Pain Medicine | Admitting: Pain Medicine

## 2024-04-07 VITALS — BP 108/82 | HR 88 | Temp 97.2°F | Resp 16 | Ht 65.0 in | Wt 110.0 lb

## 2024-04-07 DIAGNOSIS — M5134 Other intervertebral disc degeneration, thoracic region: Secondary | ICD-10-CM | POA: Diagnosis not present

## 2024-04-07 DIAGNOSIS — G8929 Other chronic pain: Secondary | ICD-10-CM

## 2024-04-07 DIAGNOSIS — M4314 Spondylolisthesis, thoracic region: Secondary | ICD-10-CM | POA: Diagnosis not present

## 2024-04-07 DIAGNOSIS — S22000S Wedge compression fracture of unspecified thoracic vertebra, sequela: Secondary | ICD-10-CM | POA: Diagnosis not present

## 2024-04-07 DIAGNOSIS — M4854XS Collapsed vertebra, not elsewhere classified, thoracic region, sequela of fracture: Secondary | ICD-10-CM

## 2024-04-07 DIAGNOSIS — M5489 Other dorsalgia: Secondary | ICD-10-CM

## 2024-04-07 DIAGNOSIS — Z91040 Latex allergy status: Secondary | ICD-10-CM | POA: Diagnosis not present

## 2024-04-07 DIAGNOSIS — M546 Pain in thoracic spine: Secondary | ICD-10-CM

## 2024-04-07 MED ORDER — IOHEXOL 180 MG/ML  SOLN
10.0000 mL | Freq: Once | INTRAMUSCULAR | Status: AC
  Administered 2024-04-07: 10 mL via EPIDURAL

## 2024-04-07 MED ORDER — IOHEXOL 180 MG/ML  SOLN
INTRAMUSCULAR | Status: AC
Start: 2024-04-07 — End: ?
  Filled 2024-04-07: qty 10

## 2024-04-07 MED ORDER — MIDAZOLAM HCL 5 MG/5ML IJ SOLN
INTRAMUSCULAR | Status: AC
  Filled 2024-04-07: qty 5

## 2024-04-07 MED ORDER — FENTANYL CITRATE (PF) 100 MCG/2ML IJ SOLN: 25.0000 ug | INTRAMUSCULAR | Status: DC | PRN

## 2024-04-07 MED ORDER — PENTAFLUOROPROP-TETRAFLUOROETH EX AERO: INHALATION_SPRAY | Freq: Once | CUTANEOUS | Status: DC

## 2024-04-07 MED ORDER — SODIUM CHLORIDE 0.9% FLUSH
2.0000 mL | Freq: Once | INTRAVENOUS | Status: AC
  Administered 2024-04-07: 2 mL

## 2024-04-07 MED ORDER — LIDOCAINE HCL (PF) 2 % IJ SOLN
INTRAMUSCULAR | Status: AC
  Filled 2024-04-07: qty 10

## 2024-04-07 MED ORDER — FENTANYL CITRATE (PF) 100 MCG/2ML IJ SOLN
INTRAMUSCULAR | Status: AC
  Filled 2024-04-07: qty 2

## 2024-04-07 MED ORDER — LIDOCAINE HCL 2 % IJ SOLN
20.0000 mL | Freq: Once | INTRAMUSCULAR | Status: AC
  Administered 2024-04-07: 200 mg

## 2024-04-07 MED ORDER — DEXAMETHASONE SODIUM PHOSPHATE 10 MG/ML IJ SOLN
10.0000 mg | Freq: Once | INTRAMUSCULAR | Status: AC
  Administered 2024-04-07: 10 mg

## 2024-04-07 MED ORDER — ROPIVACAINE HCL 2 MG/ML IJ SOLN
INTRAMUSCULAR | Status: AC
  Filled 2024-04-07: qty 20

## 2024-04-07 MED ORDER — ROPIVACAINE HCL 2 MG/ML IJ SOLN
2.0000 mL | Freq: Once | INTRAMUSCULAR | Status: AC
  Administered 2024-04-07: 2 mL via EPIDURAL

## 2024-04-07 MED ORDER — DEXAMETHASONE SODIUM PHOSPHATE 10 MG/ML IJ SOLN
INTRAMUSCULAR | Status: AC
  Filled 2024-04-07: qty 1

## 2024-04-07 MED ORDER — MIDAZOLAM HCL 5 MG/5ML IJ SOLN
0.5000 mg | Freq: Once | INTRAMUSCULAR | Status: AC
  Administered 2024-04-07: 2 mg via INTRAVENOUS

## 2024-04-07 MED ORDER — SODIUM CHLORIDE (PF) 0.9 % IJ SOLN
INTRAMUSCULAR | Status: AC
  Filled 2024-04-07: qty 10

## 2024-04-07 NOTE — Progress Notes (Signed)
 Safety precautions to be maintained throughout the outpatient stay will include: orient to surroundings, keep bed in low position, maintain call bell within reach at all times, provide assistance with transfer out of bed and ambulation.

## 2024-04-08 ENCOUNTER — Telehealth: Payer: Self-pay | Admitting: *Deleted

## 2024-04-08 NOTE — Telephone Encounter (Signed)
 Post procedure call; voicemail left

## 2024-04-13 ENCOUNTER — Telehealth: Payer: Self-pay | Admitting: Neurosurgery

## 2024-04-13 DIAGNOSIS — S22020D Wedge compression fracture of second thoracic vertebra, subsequent encounter for fracture with routine healing: Secondary | ICD-10-CM

## 2024-04-13 DIAGNOSIS — G5622 Lesion of ulnar nerve, left upper limb: Secondary | ICD-10-CM

## 2024-04-13 MED ORDER — GABAPENTIN 300 MG PO CAPS: 300.0000 mg | ORAL_CAPSULE | Freq: Three times a day (TID) | ORAL | 1 refills | Status: DC

## 2024-04-13 NOTE — Telephone Encounter (Signed)
 Neurontin  refill sent for patient. Please let her know.

## 2024-04-13 NOTE — Addendum Note (Signed)
 Addended byLucetta Russel on: 04/13/2024 08:42 AM   Modules accepted: Orders

## 2024-04-13 NOTE — Telephone Encounter (Signed)
 Please call and make sure she needs refill of neurontin . Request came from pharmacy.   How often is she taking it? Tid?   Will send back denied for now.   Thanks.

## 2024-04-27 DIAGNOSIS — M545 Low back pain, unspecified: Secondary | ICD-10-CM | POA: Insufficient documentation

## 2024-05-10 ENCOUNTER — Ambulatory Visit: Admitting: Student in an Organized Health Care Education/Training Program

## 2024-05-11 ENCOUNTER — Ambulatory Visit: Attending: Pain Medicine | Admitting: Pain Medicine

## 2024-05-11 ENCOUNTER — Encounter: Payer: Self-pay | Admitting: Pain Medicine

## 2024-05-11 VITALS — BP 96/44 | HR 79 | Temp 97.3°F | Ht 65.0 in | Wt 110.0 lb

## 2024-05-11 DIAGNOSIS — M47812 Spondylosis without myelopathy or radiculopathy, cervical region: Secondary | ICD-10-CM

## 2024-05-11 DIAGNOSIS — Z09 Encounter for follow-up examination after completed treatment for conditions other than malignant neoplasm: Secondary | ICD-10-CM | POA: Insufficient documentation

## 2024-05-11 DIAGNOSIS — G8929 Other chronic pain: Secondary | ICD-10-CM | POA: Insufficient documentation

## 2024-05-11 DIAGNOSIS — G4486 Cervicogenic headache: Secondary | ICD-10-CM

## 2024-05-11 DIAGNOSIS — M546 Pain in thoracic spine: Secondary | ICD-10-CM | POA: Diagnosis not present

## 2024-05-11 DIAGNOSIS — M4312 Spondylolisthesis, cervical region: Secondary | ICD-10-CM

## 2024-05-11 DIAGNOSIS — R937 Abnormal findings on diagnostic imaging of other parts of musculoskeletal system: Secondary | ICD-10-CM

## 2024-05-11 DIAGNOSIS — M542 Cervicalgia: Secondary | ICD-10-CM | POA: Diagnosis not present

## 2024-05-11 DIAGNOSIS — M5030 Other cervical disc degeneration, unspecified cervical region: Secondary | ICD-10-CM

## 2024-05-11 DIAGNOSIS — M5489 Other dorsalgia: Secondary | ICD-10-CM | POA: Diagnosis not present

## 2024-05-11 NOTE — Patient Instructions (Signed)

## 2024-05-11 NOTE — Progress Notes (Signed)
 PROVIDER NOTE: Interpretation of information contained herein should be left to medically-trained personnel. Specific patient instructions are provided elsewhere under Patient Instructions section of medical record. This document was created in part using AI and STT-dictation technology, any transcriptional errors that may result from this process are unintentional.  Patient: Miranda Fleming  Service: E/M   PCP: System, Provider Not In  DOB: [birth year]  DOS: 05/11/2024  Provider: Eric DELENA Como, MD  MRN: [id number]  Delivery: Face-to-face  Specialty: Interventional Pain Management  Type: Established Patient  Setting: Ambulatory outpatient facility  Specialty designation: 09  Referring Prov.: No ref. provider found  Location: Outpatient office facility       History of present illness (HPI) [name], a 58 y.o. year old female, is here today because of her Chronic midline thoracic back pain [M54.6, G89.29]. [name] primary complain today is Neck Pain (lower)  Pertinent problems: [name] has Non-traumatic compression fracture of T1 thoracic vertebra, sequela; Pain in right knee; Carpal tunnel syndrome of right wrist; Median neuropathy; Migraine; Neuropathy; Right radial nerve palsy; Ulnar nerve palsy; Chronic pain syndrome; Dupuytren's disease of palm of hands (Bilateral); Chronic thoracic back pain (1ry area of Pain) (Midline); Thoracic spine pain; Osteopenia determined by x-ray; Left cervical radiculopathy; Right cervical radiculopathy; Cervicalgia; Obstruction of right vertebral artery; Stenosis of right vertebral artery; Ulnar neuritis, left; Arthralgia of both knees; Other spondylosis, thoracic region; Abnormal MRI, thoracic spine (02/16/2024); Abnormal MRI, cervical spine (02/16/2024 & 03/23/2024); Non-traumatic compression fracture of T2 thoracic vertebra, sequela; Non-traumatic compression fracture of T3 thoracic vertebra, sequela; Non-traumatic compression  fracture of T4 thoracic vertebra, sequela; Compression of thoracic vertebra, sequela (T1, T2, T3, T4); DDD (degenerative disc disease), thoracic; Thoracic vertebrogenic pain; Thoracic facet arthropathy; Thoracic facet joint pain; Grade 1 Anterolisthesis of thoracic spine (T2-3); Grade 1 Anterolisthesis of cervical spine (C7-T1); DDD (degenerative disc disease), cervical; Cervical spondylosis; Compression fracture of thoracic vertebra with routine healing; Lumbar spine pain; Cervicogenic headache (Bilateral); Cervical facet joint pain (Multilevel) (Bilateral); Cervical facet joint arthropathy; Cervical facet syndrome; and Spondylosis without myelopathy or radiculopathy, cervical region on their pertinent problem list.  Pain Assessment: Severity of Chronic pain is reported as a 10-Worst pain ever/10. Location: Neck Left, Right/pain radiaties down her neck to both side of her shoulder and down her left arm. Onset: More than a month ago. Quality: Aching, Burning, Throbbing, Constant, Stabbing. Timing: Constant. Modifying factor(s): nothing. Vitals:  height is 5' 5 (1.651 m) and weight is 110 lb (49.9 kg). Her temperature is 97.3 F (36.3 C) (abnormal). Her blood pressure is 96/44 (abnormal) and her pulse is 79. Her oxygen saturation is 100%.  BMI: Estimated body mass index is 18.3 kg/m as calculated from the following:   Height as of this encounter: 5' 5 (1.651 m).   Weight as of this encounter: 110 lb (49.9 kg).  Last encounter: 03/23/2024. Last procedure: 04/07/2024.  Reason for encounter: post-procedure evaluation and assessment.   Discussed the use of AI scribe software for clinical note transcription with the patient, who gave verbal consent to proceed.  History of Present Illness   Miranda Fleming is a 58 year old female with cervical spondylosis who presents with neck pain and arm symptoms. She was referred by Dr. Claudene, a neurosurgeon, for evaluation of her chronic neck pain and arm  symptoms.  She experiences persistent neck pain and tenderness, significantly limiting her neck mobility. 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 She uses a Minerva neck brace but finds it uncomfortable and not particularly effective.  She has pain radiating down her left arm, affecting her shoulder, elbow, and extending to her pinky and ring fingers, along with pain in the axillary region of her left arm. Previously, she had numbness in her left leg, which has resolved over the past five to six weeks.  She suffers from headaches that originate in the neck and extend to the ocular region, primarily on the left side. These headaches began in the first or second week after her initial symptoms and are described as 'major migraines.'  Her MRI revealed chronic T1, T2, T3 superior end plate vertebral compression fractures and mild vertebral body height loss, as well as cervical spondylosis. It also showed a grade one anterolisthesis at the C7-T1, T1-2, and T2-3 levels.  She is not currently taking any blood thinners or new medications and has not experienced any recent heart attacks or new medical conditions.      Post-Procedure Evaluation   Inter-Laminar Thoracic Epidural Steroid Block/Injection  #3  Laterality: Left Level: T3-4  DOS: 04/07/2024 Performed by: Eric DELENA Como, MD Imaging: Fluoroscopic guidance Anesthesia: Local anesthesia (1-2% Lidocaine ) Anxiolysis: IV Versed  2.0 mg Sedation: Moderate Sedation None required. No Fentanyl  administered.         Purpose: Diagnostic/Therapeutic Indications: Thoracic back pain, radicular pain, with degenerative disc disease severe enough to impact quality of life or function. 1. Chronic thoracic back pain (1ry area of Pain) (Midline)   2. Compression of thoracic vertebra, sequela (T1, T2, T3, T4)   3. DDD (degenerative disc disease), thoracic   4. Grade 1 Anterolisthesis of  thoracic spine (T2-3)   5. Non-traumatic compression fracture of T1 thoracic vertebra, sequela   6. Non-traumatic compression fracture of T2 thoracic vertebra, sequela   7. Non-traumatic compression fracture of T3 thoracic vertebra, sequela   8. Non-traumatic compression fracture of T4 thoracic vertebra, sequela   9. Thoracic facet joint pain   10. Thoracic spine pain   11. Thoracic vertebrogenic pain   12. Latex precautions, history of latex allergy    NAS-11 Pain score:   Pre-procedure: 7 /10   Post-procedure: 8 /10     Effectiveness:  Initial hour after procedure: 0 %. Subsequent 4-6 hours post-procedure: 0 %. Analgesia past initial 6 hours: 0 %. Ongoing improvement:  Analgesic: According to the patient she had absolutely no short-term or long-term benefit from the injection.  This was rather surprising since previously she had attained excellent benefit for the duration of the local anesthetic.  This would suggest that her pain pattern has some mild changed. Function: No improvement ROM: No improvement   Pharmacotherapy Assessment   Analgesic: No chronic opioid analgesics therapy prescribed by our practice. Tramadol 50 mg tablet, 1 tab p.o. daily (# 30) (30/month) (last filled on 11/28/2023) MME/day: 10 mg/day   Monitoring: [place] PMP: PDMP reviewed during this encounter.       Pharmacotherapy: No side-effects or adverse reactions reported. Compliance: No problems identified. Effectiveness: Clinically acceptable.  Delores Dorothe DELENA, RN  05/11/2024  1:33 PM  Sign when Signing Visit Safety precautions to be maintained throughout the outpatient stay will include: orient to surroundings, keep bed in low position, maintain call bell within reach at all times, provide assistance with transfer out of bed and ambulation.     UDS:  Summary  Date Value Ref Range Status  12/07/2023 FINAL  Final    Comment:     ====================================================================  Compliance Drug Analysis, Ur ==================================================================== Specimen Alert Not Detected result may be consistent with the time of last use noted for this medication. AS NEEDED (Tramadol) ==================================================================== Test                             Result       Flag       Units  Drug Present and Declared for Prescription Verification   Alprazolam                      419          EXPECTED   ng/mg creat   Alpha-hydroxyalprazolam        758          EXPECTED   ng/mg creat    Source of alprazolam  is a scheduled prescription medication. Alpha-    hydroxyalprazolam is an expected metabolite of alprazolam .  Drug Present not Declared for Prescription Verification   Ibuprofen                       PRESENT      UNEXPECTED   Diphenhydramine                 PRESENT      UNEXPECTED  Drug Absent but Declared for Prescription Verification   Tramadol                       Not Detected UNEXPECTED ng/mg creat ==================================================================== Test                      Result    Flag   Units      Ref Range   Creatinine              31               mg/dL      >=79 ==================================================================== Declared Medications:  The flagging and interpretation on this report are based on the  following declared medications.  Unexpected results may arise from  inaccuracies in the declared medications.   **Note: The testing scope of this panel includes these medications:   Alprazolam  (Xanax )  Tramadol (Ultram)   **Note: The testing scope of this panel does not include the  following reported medications:   Calcium  Estradiol  (Estrace )  Fish Oil  Multivitamin  Supplement  Vitamin B12 ==================================================================== For clinical consultation, please call  [phone]. ====================================================================     No results found for: CBDTHCR No results found for: D8THCCBX No results found for: D9THCCBX  ROS  Constitutional: Denies any fever or chills Gastrointestinal: No reported hemesis, hematochezia, vomiting, or acute GI distress Musculoskeletal: Denies any acute onset joint swelling, redness, loss of ROM, or weakness Neurological: No reported episodes of acute onset apraxia, aphasia, dysarthria, agnosia, amnesia, paralysis, loss of coordination, or loss of consciousness  Medication Review  ALPRAZolam , Bone Essentials, Fish Oil, Progesterone, alendronate, calcium carbonate, cyanocobalamin , estradiol , gabapentin , multivitamin, and traMADol  History Review  Allergy: Ms. Collison is allergic to amoxicillin and latex. Drug: Ms. Daddona  has no history on file for drug use. Alcohol:  reports current alcohol use of about 4.0 standard drinks of alcohol per week. Tobacco:  reports that she quit smoking about 2 years ago. Her smoking use included cigarettes. She started smoking about 43 years ago. She has a 61.6 pack-year smoking history. She has never used smokeless  tobacco. Social: Ms. Britain  reports that she quit smoking about 2 years ago. Her smoking use included cigarettes. She started smoking about 43 years ago. She has a 61.6 pack-year smoking history. She has never used smokeless tobacco. She reports current alcohol use of about 4.0 standard drinks of alcohol per week. Medical:  has a past medical history of DVT, lower extremity, distal, acute (HCC), Palpitations (11/23/2015), SUI (stress urinary incontinence, female), and Wears glasses. Surgical: Ms. Salinas  has a past surgical history that includes EXPLORATORY LAPAROTOMY W/  LEFT SALPINGOOPHORECTOMY (01-25-2001); Abdominal hysterectomy (1995); LAPAROSCOPY'S AND LAPAROTOMY'S FOR PERSISTANT OVARIAN CYST (X 5  1987;  1989;  1990;  1993;  1994); and  Pubovaginal sling (N/A, 10/31/2013). Family: family history includes Breast cancer in her mother; Colon cancer in her paternal grandfather; Diabetes in her maternal aunt; Healthy in her brother and father; Osteopenia in her sister; Osteoporosis in her mother; Ovarian cancer in her maternal grandmother and mother.  Laboratory Chemistry Profile   Renal Lab Results  Component Value Date   BUN 10 01/10/2024   CREATININE 0.43 (L) 01/10/2024   BCR 19 12/07/2023   GFRAA >60 12/18/2017   GFRNONAA >60 01/10/2024    Hepatic Lab Results  Component Value Date   AST 19 01/10/2024   ALT 22 01/10/2024   ALBUMIN 3.5 01/10/2024   ALKPHOS 45 01/10/2024   LIPASE 35 05/24/2021   AMMONIA <10 01/09/2024    Electrolytes Lab Results  Component Value Date   NA 135 01/10/2024   K 3.1 (L) 01/10/2024   CL 106 01/10/2024   CALCIUM 8.5 (L) 01/10/2024   MG 1.8 01/10/2024    Bone Lab Results  Component Value Date   VD25OH 50.8 12/18/2017   25OHVITD1 68 12/07/2023   25OHVITD2 41 12/07/2023   25OHVITD3 27 12/07/2023    Inflammation (CRP: Acute Phase) (ESR: Chronic Phase) Lab Results  Component Value Date   CRP <1 12/07/2023   ESRSEDRATE 3 12/07/2023   LATICACIDVEN 1.4 01/09/2024         Note: Above Lab results reviewed.  Recent Imaging Review  DG PAIN CLIN[facility] C-ARM 1-60 MIN NO REPORT Fluoro was used, but no Radiologist interpretation will be provided.  Please refer to NOTES tab for provider progress note. Note: Reviewed        Physical Exam  Vitals: BP (!) 96/44   Pulse 79   Temp (!) 97.3 F (36.3 C)   Ht 5' 5 (1.651 m)   Wt 110 lb (49.9 kg)   SpO2 100%   BMI 18.30 kg/m  BMI: Estimated body mass index is 18.3 kg/m as calculated from the following:   Height as of this encounter: 5' 5 (1.651 m).   Weight as of this encounter: 110 lb (49.9 kg). Ideal: Ideal body weight: 57 kg (125 lb 10.6 oz) General appearance: Well nourished, well developed, and well hydrated. In no apparent  acute distress Mental status: Alert, oriented x 3 (person, place, & time)       Respiratory: No evidence of acute respiratory distress Eyes: PERLA Assessment   Diagnosis Status  1. Chronic thoracic back pain (1ry area of Pain) (Midline)   2. Thoracic spine pain   3. Thoracic vertebrogenic pain   4. Postop check   5. Abnormal MRI, cervical spine (02/16/2024)   6. Cervicalgia   7. Cervical spondylosis   8. DDD (degenerative disc disease), cervical   9. Grade 1 Anterolisthesis of cervical spine (C7-T1)   10. Cervicogenic headache (Bilateral)  11. Cervical facet joint pain (Multilevel) (Bilateral)   12. Cervical facet joint arthropathy   13. Cervical facet syndrome   14. Spondylosis without myelopathy or radiculopathy, cervical region    Controlled Controlled Controlled   Updated Problems: Problem  Cervicogenic headache (Bilateral)  Cervical facet joint pain (Multilevel) (Bilateral)  Cervical facet joint arthropathy  Cervical Facet Syndrome  Spondylosis Without Myelopathy Or Radiculopathy, Cervical Region  Lumbar spine pain  Abnormal MRI, cervical spine (02/16/2024 & 03/23/2024)   (03/23/2024) CERVICAL MRI FINDINGS: Alignment: Slight grade 1 anterolisthesis at C7-T1, T1-T2 and T2-T3.   Vertebrae: Cervical vertebral body height is maintained. Chronic T1, T2 and T3 superior endplate vertebral compression fractures. Mild vertebral body height loss at these levels, unchanged from the recent prior thoracic spine MRI of 02/04/2024. As before, there is trace degenerative edema (and enhancement) along the T2 superior endplate.   Cord: No signal abnormality identified within the cervical spinal cord. No pathologic spinal cord enhancement.   Posterior Fossa, vertebral arteries, paraspinal tissues: Known retrocerebellar arachnoid cyst. Carotid and vertebral arteries evaluated on same day MRA neck. No paraspinal mass or collection.   Disc levels:   No more than mild disc  degeneration within the cervical spine.   C2-C3: No significant disc herniation or stenosis. The very shallow central disc protrusion described at this level on the prior cervical spine MRI likely reflected volume averaging on the axial sequences.   C3-C4: Slight disc bulge. No significant spinal canal or foraminal stenosis.   C4-C5: Slight disc bulge. Minimal facet arthropathy on the right. No significant spinal canal or foraminal stenosis.   C5-C6: Slight disc bulge. No significant spinal canal or foraminal stenosis.   C6-C7: Slight disc bulge. No significant spinal canal or foraminal stenosis.   C7-T1: Slight grade 1 anterolisthesis. Mild facet arthropathy (predominantly on the right). No significant spinal canal or foraminal stenosis.   IMPRESSION: 1. Cervical spondylosis as outlined within the body of the report and having not significantly changed since the recent prior cervical spine MRI of 02/04/2024. No significant spinal canal or foraminal stenosis. No more than mild disc degeneration. 2. No lesion is identified within the cervical spinal cord. 3. Chronic T1, T2 and T3 superior endplate vertebral compression fractures. Mild vertebral body height loss at these levels, unchanged from the recent prior thoracic spine MRI 02/04/2024. A known T4 superior endplate vertebral compression fracture is excluded from the field of view on today's exam.     Electronically Signed   By: Rockey Childs D.O.   On: 03/23/2024 13:44 _________________________________________________  (02/16/2024) MRI CERVICAL SPINE FINDINGS: Alignment: Slight C7-T1 grade 1 anterolisthesis. Vertebrae: Cervical vertebral body height is maintained. No significant marrow edema or focal worrisome marrow lesion. Cord: No signal abnormality identified within the cervical spinal cord. Posterior Fossa, vertebral arteries, paraspinal tissues: Known retrocerebellar arachnoid cyst, incompletely imaged. A flow void  is poorly delineated within portions of the right vertebral artery V2 segment proximally. No paraspinal mass or collection.  DISC LEVELS: Mild disc degeneration. C2-3: Very shallow broad-based central disc protrusion.  C3-4: Slight disc bulge.  C4-5: Shallow disc bulge.  C5-6: Slight disc bulge.  C6-7: Slight disc bulge.  C7-T1: Slight grade 1 anterolisthesis. Mild-to-moderate facet arthropathy on the right.   IMPRESSION: 1. Cervical spondylosis as outlined within the body of the report. No significant spinal canal or foraminal stenosis. No more than mild disc degeneration. Mild-to-moderate facet arthropathy on the right at C7-T1. 2. Slight C7-T1 grade 1 anterolisthesis. 3. A flow void is  poorly delineated within portions of the right vertebral artery V2 segment proximally, concerning for high-grade stenosis or vessel occlusion. Consider MR or CT angiography for further evaluation.     Plan of Care  Problem-specific:  Assessment and Plan    Cervical spondylosis with radiculopathy   Chronic cervical spondylosis with radiculopathy affects the neck and left arm. MRI reveals grade one anterolisthesis at C7-T1, T1-2, and T2-3, stressing facet joints. Symptoms include neck pain, tenderness, and radicular pain in the left shoulder, elbow, and fingers, worsened by movement, especially in the upper cervical region. Perform diagnostic cervical facet blocks at the upper cervical levels to assess and potentially relieve symptoms. Consider radiofrequency ablation for longer-lasting relief if blocks are successful. Advise wearing a Minerva neck brace as per orthopedic recommendation. Discuss surgical options with a neurosurgeon if symptoms persist, focusing on nerve decompression rather than extensive fusion. Informed consent obtained, discussing the temporary relief from steroid injections and potential need for further intervention.  Cervicogenic headaches   Cervicogenic headaches originate from the  cervical spine, worsened by movement, and associated with neck pain. Headaches start at the back of the head and move forward, mainly on the left side, likely related to cervical spondylosis and facet joint stress. Perform diagnostic cervical facet blocks at the upper cervical levels to assess and potentially relieve headaches. Consider radiofrequency ablation for longer-lasting relief if blocks are successful.  Chronic vertebral compression fractures   Chronic T1, T2, T3 superior end plate vertebral compression fractures with mild vertebral body height loss show no significant change on recent MRI. These fractures contribute to axial pain and are part of the anatomical changes causing mechanical stress.       Ms. Yareli Carthen has a current medication list which includes the following long-term medication(s): calcium carbonate, gabapentin , and progesterone.  Pharmacotherapy (Medications Ordered): No orders of the defined types were placed in this encounter.  Orders:  Orders Placed This Encounter  Procedures   CERVICAL FACET (MEDIAL BRANCH NERVE BLOCK)     Standing Status:   Future    Expiration Date:   08/11/2024    Scheduling Instructions:     Procedure: Cervical facet Block     Type: Medial Branch Block     Side: Bilateral     Purpose: Diagnostic/Therapeutic     Level(s): C2-3, C3-4, C4-5, and TBD by Fluoroscopic Pain Mapping Facet joints (TON, C3, C4, C5, and TBD Medial Branch Nerves)     Sedation: Moderate Conscious Sedation (ECT)     Timeframe: As soon as schedule allows.    Where will this procedure be performed?:   [facility]   Nursing Instructions:    Please complete this patient's postprocedure evaluation.    Scheduling Instructions:     Please complete this patient's postprocedure evaluation.     Interventional Therapies  Risk Factors  Considerations  Medical Comorbidities:  Allergy: LATEX  Hx. DVT     Planned  Pending:       Under  consideration:      Completed:   Diagnostic/therapeutic midline (T2-3) thoracic ESI #1 (12/24/2023) (100/100/5/RUE:100LUE:90UB:5-10)  Therapeutic left (T2-3) thoracic ESI #2 (03/10/2024) (100/100/90x36hrs/45)  Therapeutic left (T3-4) thoracic ESI #1 (04/07/2024) (DNKFU-Returned 05/11/2024) (0/0/0/0)    Therapeutic  Palliative (PRN) options:   None established   Completed by other providers:   None reported     Return for (ECT): (B) C2-C5 C-FCT Blk #1.    Recent Visits Date Type Provider Dept  04/07/24 Procedure visit Tanya Glisson, MD  [facility]  03/23/24 Office Visit Tanya Glisson, MD [facility]  03/10/24 Procedure visit Tanya Glisson, MD [facility]  Showing recent visits within past 90 days and meeting all other requirements Today's Visits Date Type Provider Dept  05/11/24 Office Visit Tanya Glisson, MD [facility]  Showing today's visits and meeting all other requirements Future Appointments No visits were found meeting these conditions. Showing future appointments within next 90 days and meeting all other requirements  I discussed the assessment and treatment plan with the patient. The patient was provided an opportunity to ask questions and all were answered. The patient agreed with the plan and demonstrated an understanding of the instructions.  Patient advised to call back or seek an in-person evaluation if the symptoms or condition worsens.  Duration of encounter: 32 minutes.  Total time on encounter, as per AMA guidelines included both the face-to-face and non-face-to-face time personally spent by the physician and/or other qualified health care professional(s) on the day of the encounter (includes time in activities that require the physician or other qualified health care professional and does not include time in activities normally performed by clinical staff). Physician's time may include the following  activities when performed: Preparing to see the patient (e.g., pre-charting review of records, searching for previously ordered imaging, lab work, and nerve conduction tests) Review of prior analgesic pharmacotherapies. Reviewing PMP Interpreting ordered tests (e.g., lab work, imaging, nerve conduction tests) Performing post-procedure evaluations, including interpretation of diagnostic procedures Obtaining and/or reviewing separately obtained history Performing a medically appropriate examination and/or evaluation Counseling and educating the patient/family/caregiver Ordering medications, tests, or procedures Referring and communicating with other health care professionals (when not separately reported) Documenting clinical information in the electronic or other health record Independently interpreting results (not separately reported) and communicating results to the patient/ family/caregiver Care coordination (not separately reported)  Note by: Glisson DELENA Tanya, MD (TTS and AI technology used. I apologize for any typographical errors that were not detected and corrected.) Date: 05/11/2024; Time: 5:07 PM

## 2024-05-11 NOTE — Progress Notes (Signed)
 Safety precautions to be maintained throughout the outpatient stay will include: orient to surroundings, keep bed in low position, maintain call bell within reach at all times, provide assistance with transfer out of bed and ambulation.

## 2024-05-23 NOTE — Patient Instructions (Signed)

## 2024-05-23 NOTE — Progress Notes (Unsigned)
 PROVIDER NOTE: Interpretation of information contained herein should be left to medically-trained personnel. Specific patient instructions are provided elsewhere under Patient Instructions section of medical record. This document was created in part using STT-dictation technology, any transcriptional errors that may result from this process are unintentional.  Patient: Miranda Fleming Type: Established DOB: 09/04/1966 MRN: [id number] PCP: System, Provider Not In  Service: Procedure DOS: 05/24/2024 Setting: Ambulatory Location: Ambulatory outpatient facility Delivery: Face-to-face Provider: Eric DELENA Como, MD Specialty: Interventional Pain Management Specialty designation: 09 Location: Outpatient facility Ref. Prov.: Como Eric, MD       Interventional Therapy   Procedure: Cervical Facet Medial Branch Block(s) #1  Laterality: Bilateral  Level: TON, C3, C4, and C5 Medial Branch Level(s). Injecting these levels blocks the C2-3, C3-4, and C4-5 cervical facet joints.  Imaging: Fluoroscopic guidance Anesthesia: Local anesthesia (1-2% Lidocaine ) Anxiolysis: IV Versed          Sedation:                         DOS: 05/24/2024  Performed by: Eric DELENA Como, MD  Purpose: Diagnostic/Therapeutic Indications: Cervicalgia (cervical spine axial pain) severe enough to impact quality of life or function. 1. Cervicalgia   2. Cervical facet joint pain (Multilevel) (Bilateral)   3. Cervical facet joint arthropathy   4. Cervical facet syndrome   5. Cervicogenic headache (Bilateral)   6. Grade 1 Anterolisthesis of cervical spine (C7-T1)   7. Spondylosis without myelopathy or radiculopathy, cervical region   8. DDD (degenerative disc disease), cervical   9. Latex precautions, history of latex allergy    NAS-11 Pain score:   Pre-procedure:  /10   Post-procedure:  /10     Position / Prep / Materials:  Position: Prone. Head in cradle. C-spine slightly flexed. Prep solution:  ChloraPrep (2% chlorhexidine gluconate and 70% isopropyl alcohol) Prep Area: Posterior Cervico-thoracic Region. From occipital ridge to tip of scapula, and from shoulder to shoulder. Entire posterior and lateral neck surface. Materials:  Tray: Block Needle(s):  Type: Spinal  Gauge (G): 22  Length: 3.5-in  Qty:  5     H&P (Pre-op Assessment):  Miranda Fleming is a 58 y.o. (year old), female patient, seen today for interventional treatment. She  has a past surgical history that includes EXPLORATORY LAPAROTOMY W/  LEFT SALPINGOOPHORECTOMY (01-25-2001); Abdominal hysterectomy (1995); LAPAROSCOPY'S AND LAPAROTOMY'S FOR PERSISTANT OVARIAN CYST (X 5  1987;  1989;  1990;  1993;  1994); and Pubovaginal sling (N/A, 10/31/2013). Miranda Fleming has a current medication list which includes the following prescription(s): alendronate, alprazolam , calcium carbonate, cyanocobalamin , estradiol , gabapentin , bone essentials, multivitamin, fish oil, progesterone, and tramadol. Her primarily concern today is the No chief complaint on file.  Initial Vital Signs:  Pulse/HCG Rate:    Temp:   Resp:   BP:   SpO2:    BMI: Estimated body mass index is 18.3 kg/m as calculated from the following:   Height as of 05/11/24: 5' 5 (1.651 m).   Weight as of 05/11/24: 110 lb (49.9 kg).  Risk Assessment: Allergies: Reviewed. She is allergic to amoxicillin and latex.  Allergy Precautions: None required Coagulopathies: Reviewed. None identified.  Blood-thinner therapy: None at this time Active Infection(s): Reviewed. None identified. Miranda Fleming is afebrile  Site Confirmation: Miranda Fleming was asked to confirm the procedure and laterality before marking the site Procedure checklist: Completed Consent: Before the procedure and under the influence of no sedative(s), amnesic(s), or anxiolytics, the patient was informed of the  treatment options, risks and possible complications. To fulfill our ethical and legal obligations, as  recommended by the American Medical Association's Code of Ethics, I have informed the patient of my clinical impression; the nature and purpose of the treatment or procedure; the risks, benefits, and possible complications of the intervention; the alternatives, including doing nothing; the risk(s) and benefit(s) of the alternative treatment(s) or procedure(s); and the risk(s) and benefit(s) of doing nothing. The patient was provided information about the general risks and possible complications associated with the procedure. These may include, but are not limited to: failure to achieve desired goals, infection, bleeding, organ or nerve damage, allergic reactions, paralysis, and death. In addition, the patient was informed of those risks and complications associated to Spine-related procedures, such as failure to decrease pain; infection (i.e.: Meningitis, epidural or intraspinal abscess); bleeding (i.e.: epidural hematoma, subarachnoid hemorrhage, or any other type of intraspinal or peri-dural bleeding); organ or nerve damage (i.e.: Any type of peripheral nerve, nerve root, or spinal cord injury) with subsequent damage to sensory, motor, and/or autonomic systems, resulting in permanent pain, numbness, and/or weakness of one or several areas of the body; allergic reactions; (i.e.: anaphylactic reaction); and/or death. Furthermore, the patient was informed of those risks and complications associated with the medications. These include, but are not limited to: allergic reactions (i.e.: anaphylactic or anaphylactoid reaction(s)); adrenal axis suppression; blood sugar elevation that in diabetics may result in ketoacidosis or comma; water  retention that in patients with history of congestive heart failure may result in shortness of breath, pulmonary edema, and decompensation with resultant heart failure; weight gain; swelling or edema; medication-induced neural toxicity; particulate matter embolism and blood vessel  occlusion with resultant organ, and/or nervous system infarction; and/or aseptic necrosis of one or more joints. Finally, the patient was informed that Medicine is not an exact science; therefore, there is also the possibility of unforeseen or unpredictable risks and/or possible complications that may result in a catastrophic outcome. The patient indicated having understood very clearly. We have given the patient no guarantees and we have made no promises. Enough time was given to the patient to ask questions, all of which were answered to the patient's satisfaction. Miranda Fleming has indicated that she wanted to continue with the procedure. Attestation: I, the ordering provider, attest that I have discussed with the patient the benefits, risks, side-effects, alternatives, likelihood of achieving goals, and potential problems during recovery for the procedure that I have provided informed consent. Date  Time: {CHL [facility]-PAIN TIME CHOICES:[id number]}  Pre-Procedure Preparation:  Monitoring: As per clinic protocol. Respiration, ETCO2, SpO2, BP, heart rate and rhythm monitor placed and checked for adequate function Safety Precautions: Patient was assessed for positional comfort and pressure points before starting the procedure. Time-out: I initiated and conducted the Time-out before starting the procedure, as per protocol. The patient was asked to participate by confirming the accuracy of the Time Out information. Verification of the correct person, site, and procedure were performed and confirmed by me, the nursing staff, and the patient. Time-out conducted as per Joint Commission's Universal Protocol (UP.01.01.01). Time:   Start Time:   hrs.  Description/Narrative of Procedure:          Laterality: See above. Targeted Levels: See above.  Rationale (medical necessity): procedure needed and proper for the diagnosis and/or treatment of the patient's medical symptoms and needs. Procedural Technique  Safety Precautions: Aspiration looking for blood return was conducted prior to all injections. At no point did we inject any substances, as  a needle was being advanced. No attempts were made at seeking any paresthesias. Safe injection practices and needle disposal techniques used. Medications properly checked for expiration dates. SDV (single dose vial) medications used. Description of the Procedure: Protocol guidelines were followed. The patient was assisted into a comfortable position. The target area was identified and the area prepped in the usual manner. Skin & deeper tissues infiltrated with local anesthetic. Appropriate amount of time allowed to pass for local anesthetics to take effect. The procedure needles were then advanced to the target area. Proper needle placement secured. Negative aspiration confirmed. Solution injected in intermittent fashion, asking for systemic symptoms every 0.5cc of injectate. The needles were then removed and the area cleansed, making sure to leave some of the prepping solution back to take advantage of its long term bactericidal properties.  Technical description of process:  C2 Medial Branch Nerve Block (MBB): The target area for the C2 dorsal medial articular branch is the lateral concave waist of the articular pillar of C2. Under fluoroscopic guidance, a Quincke needle was inserted until contact was made with os over the postero-lateral aspect of the articular pillar of C2 (target area). After negative aspiration for blood, 0.5 mL of the nerve block solution was injected without difficulty or complication. The needle was removed intact. Third Occipital Nerve (TON) Block (MBB): The target area for the TON branch is the postero-lateral aspect of the C2-C3 articulation. Under fluoroscopic guidance, a Quincke needle was inserted until contact was made with os over the target area. After negative aspiration for blood, 0.5 mL of the nerve block solution was injected without  difficulty or complication. The needle was removed intact. C3 Medial Branch Nerve Block (MBB): The target area for the C3 dorsal medial articular branch is the lateral concave waist of the articular pillar of C3. Under fluoroscopic guidance, a Quincke needle was inserted until contact was made with os over the postero-lateral aspect of the articular pillar of C3 (target area). After negative aspiration for blood, 0.5 mL of the nerve block solution was injected without difficulty or complication. The needle was removed intact. C4 Medial Branch Nerve Block (MBB): The target area for the C4 dorsal medial articular branch is the lateral concave waist of the articular pillar of C4. Under fluoroscopic guidance, a Quincke needle was inserted until contact was made with os over the postero-lateral aspect of the articular pillar of C4 (target area). After negative aspiration for blood, 0.5 mL of the nerve block solution was injected without difficulty or complication. The needle was removed intact. C5 Medial Branch Nerve Block (MBB): The target area for the C5 dorsal medial articular branch is the lateral concave waist of the articular pillar of C5. Under fluoroscopic guidance, a Quincke needle was inserted until contact was made with os over the postero-lateral aspect of the articular pillar of C5 (target area). After negative aspiration for blood, 0.5 mL of the nerve block solution was injected without difficulty or complication. The needle was removed intact. C6 Medial Branch Nerve Block (MBB): The target area for the C6 dorsal medial articular branch is the lateral concave waist of the articular pillar of C6. Under fluoroscopic guidance, a Quincke needle was inserted until contact was made with os over the postero-lateral aspect of the articular pillar of C6 (target area). After negative aspiration for blood, 0.5 mL of the nerve block solution was injected without difficulty or complication. The needle was removed  intact. C7 Medial Branch Nerve Block (MBB): The target for  the C7 dorsal medial articular branch lies on the superior-lateral tip of the C7 transverse process. Under fluoroscopic guidance, a Quincke needle was inserted until contact was made with os over the postero-lateral aspect of the articular pillar of C7 (target area). After negative aspiration for blood, 0.5 mL of the nerve block solution was injected without difficulty or complication. The needle was removed intact. C8 Medial Branch Nerve Block (MBB): The target for the C8 dorsal medial articular branch lies on the superior-lateral aspect of the T1 transverse process. Under fluoroscopic guidance, a Quincke needle was inserted until contact was made with os over the postero-lateral aspect of the articular pillar of T1 (target area). After negative aspiration for blood, 0.5 mL of the nerve block solution was injected without difficulty or complication. The needle was removed intact.  Once the entire procedure was completed, the treated area was cleaned, making sure to leave some of the prepping solution back to take advantage of its long term bactericidal properties.  Anatomy Reference Guide:      Facet Joint Innervation  C1-2 Third occipital Nerve (TON)  C2-3 TON, C3  Medial Branch  C3-4 C3, C4                     C4-5 C4, C5                     C5-6 C5, C6                     C6-7 C6, C7                     C7-T1 C7, C8                      Cervical Facet Pain Pattern overlap:   There were no vitals filed for this visit.   Start Time:   hrs. End Time:   hrs.  Imaging Guidance (Spinal):         Type of Imaging Technique: Fluoroscopy Guidance (Spinal) Indication(s): Fluoroscopy guidance for needle placement to enhance accuracy in procedures requiring precise needle localization for targeted delivery of medication in or near specific anatomical locations not easily accessible without such real-time imaging  assistance. Exposure Time: Please see nurses notes. Contrast: None used. Fluoroscopic Guidance: I was personally present during the use of fluoroscopy. Tunnel Vision Technique used to obtain the best possible view of the target area. Parallax error corrected before commencing the procedure. Direction-depth-direction technique used to introduce the needle under continuous pulsed fluoroscopy. Once target was reached, antero-posterior, oblique, and lateral fluoroscopic projection used confirm needle placement in all planes. Images permanently stored in EMR. Interpretation: No contrast injected. I personally interpreted the imaging intraoperatively. Adequate needle placement confirmed in multiple planes. Permanent images saved into the patient's record.  Post-operative Assessment:  Post-procedure Vital Signs:  Pulse/HCG Rate:    Temp:   Resp:   BP:   SpO2:    EBL: None  Complications: No immediate post-treatment complications observed by team, or reported by patient.  Note: The patient tolerated the entire procedure well. A repeat set of vitals were taken after the procedure and the patient was kept under observation following institutional policy, for this type of procedure. Post-procedural neurological assessment was performed, showing return to baseline, prior to discharge. The patient was provided with post-procedure discharge instructions, including a section on how to identify potential problems. Should any problems arise concerning  this procedure, the patient was given instructions to immediately contact us , at any time, without hesitation. In any case, we plan to contact the patient by telephone for a follow-up status report regarding this interventional procedure.  Comments:  No additional relevant information.  Plan of Care (POC)  Orders:  No orders of the defined types were placed in this encounter.    Analgesic: No chronic opioid analgesics therapy prescribed by our practice.  Tramadol 50 mg tablet, 1 tab p.o. daily (# 30) (30/month) (last filled on 11/28/2023) MME/day: 10 mg/day    Medications ordered for procedure: No orders of the defined types were placed in this encounter.  Medications administered: Miranda Fleming had no medications administered during this visit.  See the medical record for exact dosing, route, and time of administration.    Interventional Therapies  Risk Factors  Considerations  Medical Comorbidities:  Allergy: LATEX  Hx. DVT     Planned  Pending:       Under consideration:      Completed:   Diagnostic/therapeutic midline (T2-3) thoracic ESI #1 (12/24/2023) (100/100/5/RUE:100LUE:90UB:5-10)  Therapeutic left (T2-3) thoracic ESI #2 (03/10/2024) (100/100/90x36hrs/45)  Therapeutic left (T3-4) thoracic ESI #1 (04/07/2024) (DNKFU-Returned 05/11/2024) (0/0/0/0)    Therapeutic  Palliative (PRN) options:   None established   Completed by other providers:   None reported      Follow-up plan:   No follow-ups on file.     Recent Visits Date Type Provider Dept  05/11/24 Office Visit Tanya Glisson, MD [facility]  04/07/24 Procedure visit Tanya Glisson, MD [facility]  03/23/24 Office Visit Tanya Glisson, MD [facility]  03/10/24 Procedure visit Tanya Glisson, MD [facility]  Showing recent visits within past 90 days and meeting all other requirements Future Appointments Date Type Provider Dept  05/24/24 Appointment Tanya Glisson, MD [facility]  Showing future appointments within next 90 days and meeting all other requirements   Disposition: Discharge home  Discharge (Date  Time): 05/24/2024;   hrs.   Primary Care Physician: System, Provider Not In Location: [facility] Note by: Glisson DELENA Tanya, MD (TTS technology used. I apologize for any typographical errors that were not detected and  corrected.) Date: 05/24/2024; Time: 9:37 AM  Disclaimer:  Medicine is not an Visual merchandiser. The only guarantee in medicine is that nothing is guaranteed. It is important to note that the decision to proceed with this intervention was based on the information collected from the patient. The Data and conclusions were drawn from the patient's questionnaire, the interview, and the physical examination. Because the information was provided in large part by the patient, it cannot be guaranteed that it has not been purposely or unconsciously manipulated. Every effort has been made to obtain as much relevant data as possible for this evaluation. It is important to note that the conclusions that lead to this procedure are derived in large part from the available data. Always take into account that the treatment will also be dependent on availability of resources and existing treatment guidelines, considered by other Pain Management Practitioners as being common knowledge and practice, at the time of the intervention. For Medico-Legal purposes, it is also important to point out that variation in procedural techniques and pharmacological choices are the acceptable norm. The indications, contraindications, technique, and results of the above procedure should only be interpreted and judged by a Board-Certified Interventional Pain Specialist with extensive familiarity and expertise in the same exact procedure  and technique.

## 2024-05-24 ENCOUNTER — Encounter: Payer: Self-pay | Admitting: Pain Medicine

## 2024-05-24 ENCOUNTER — Ambulatory Visit
Admission: RE | Admit: 2024-05-24 | Discharge: 2024-05-24 | Disposition: A | Discharge status: Home / Self Care | Source: Ambulatory Visit | Attending: Pain Medicine | Admitting: Pain Medicine

## 2024-05-24 ENCOUNTER — Ambulatory Visit (HOSPITAL_BASED_OUTPATIENT_CLINIC_OR_DEPARTMENT_OTHER): Admitting: Pain Medicine

## 2024-05-24 VITALS — BP 102/59 | HR 85 | Temp 97.9°F | Resp 15 | Ht 65.0 in | Wt 110.0 lb

## 2024-05-24 DIAGNOSIS — G4486 Cervicogenic headache: Secondary | ICD-10-CM | POA: Insufficient documentation

## 2024-05-24 DIAGNOSIS — M542 Cervicalgia: Secondary | ICD-10-CM | POA: Insufficient documentation

## 2024-05-24 DIAGNOSIS — R937 Abnormal findings on diagnostic imaging of other parts of musculoskeletal system: Secondary | ICD-10-CM

## 2024-05-24 DIAGNOSIS — Z91040 Latex allergy status: Secondary | ICD-10-CM

## 2024-05-24 DIAGNOSIS — M47812 Spondylosis without myelopathy or radiculopathy, cervical region: Secondary | ICD-10-CM

## 2024-05-24 DIAGNOSIS — M4312 Spondylolisthesis, cervical region: Secondary | ICD-10-CM

## 2024-05-24 DIAGNOSIS — M5030 Other cervical disc degeneration, unspecified cervical region: Secondary | ICD-10-CM | POA: Insufficient documentation

## 2024-05-24 MED ORDER — PENTAFLUOROPROP-TETRAFLUOROETH EX AERO
INHALATION_SPRAY | CUTANEOUS | Status: AC
  Filled 2024-05-24: qty 30

## 2024-05-24 MED ORDER — LIDOCAINE HCL 2 % IJ SOLN
20.0000 mL | Freq: Once | INTRAMUSCULAR | Status: AC
  Administered 2024-05-24: 400 mg

## 2024-05-24 MED ORDER — PENTAFLUOROPROP-TETRAFLUOROETH EX AERO
INHALATION_SPRAY | Freq: Once | CUTANEOUS | Status: AC
  Administered 2024-05-24: 30 via TOPICAL

## 2024-05-24 MED ORDER — FENTANYL CITRATE (PF) 100 MCG/2ML IJ SOLN
INTRAMUSCULAR | Status: AC
  Filled 2024-05-24: qty 2

## 2024-05-24 MED ORDER — MIDAZOLAM HCL 5 MG/5ML IJ SOLN
INTRAMUSCULAR | Status: AC
  Filled 2024-05-24: qty 5

## 2024-05-24 MED ORDER — DEXAMETHASONE SODIUM PHOSPHATE 10 MG/ML IJ SOLN
20.0000 mg | Freq: Once | INTRAMUSCULAR | Status: AC
  Administered 2024-05-24: 20 mg

## 2024-05-24 MED ORDER — FENTANYL CITRATE (PF) 100 MCG/2ML IJ SOLN
25.0000 ug | INTRAMUSCULAR | Status: DC | PRN
  Administered 2024-05-24: 50 ug via INTRAVENOUS

## 2024-05-24 MED ORDER — ROPIVACAINE HCL 2 MG/ML IJ SOLN
18.0000 mL | Freq: Once | INTRAMUSCULAR | Status: AC
  Administered 2024-05-24: 18 mL via PERINEURAL

## 2024-05-24 MED ORDER — LIDOCAINE HCL 2 % IJ SOLN
INTRAMUSCULAR | Status: AC
  Filled 2024-05-24: qty 20

## 2024-05-24 MED ORDER — DEXAMETHASONE SODIUM PHOSPHATE 10 MG/ML IJ SOLN
INTRAMUSCULAR | Status: AC
  Filled 2024-05-24: qty 2

## 2024-05-24 MED ORDER — MIDAZOLAM HCL 5 MG/5ML IJ SOLN
0.5000 mg | Freq: Once | INTRAMUSCULAR | Status: AC
  Administered 2024-05-24: 3.5 mg via INTRAVENOUS

## 2024-05-24 MED ORDER — ROPIVACAINE HCL 2 MG/ML IJ SOLN
INTRAMUSCULAR | Status: AC
  Filled 2024-05-24: qty 20

## 2024-05-24 NOTE — Progress Notes (Signed)
 Safety precautions to be maintained throughout the outpatient stay will include: orient to surroundings, keep bed in low position, maintain call bell within reach at all times, provide assistance with transfer out of bed and ambulation.

## 2024-05-25 ENCOUNTER — Telehealth: Payer: Self-pay

## 2024-05-25 DIAGNOSIS — S22000A Wedge compression fracture of unspecified thoracic vertebra, initial encounter for closed fracture: Secondary | ICD-10-CM | POA: Insufficient documentation

## 2024-05-25 NOTE — Telephone Encounter (Signed)
 Post procedure follow up.  LM

## 2024-06-07 ENCOUNTER — Ambulatory Visit: Attending: Pain Medicine | Admitting: Pain Medicine

## 2024-06-07 ENCOUNTER — Encounter: Payer: Self-pay | Admitting: Pain Medicine

## 2024-06-07 VITALS — BP 115/70 | HR 88 | Temp 97.3°F | Resp 16 | Ht 65.0 in | Wt 109.0 lb

## 2024-06-07 DIAGNOSIS — M5030 Other cervical disc degeneration, unspecified cervical region: Secondary | ICD-10-CM

## 2024-06-07 DIAGNOSIS — M4312 Spondylolisthesis, cervical region: Secondary | ICD-10-CM

## 2024-06-07 DIAGNOSIS — M542 Cervicalgia: Secondary | ICD-10-CM | POA: Diagnosis not present

## 2024-06-07 DIAGNOSIS — R52 Pain, unspecified: Secondary | ICD-10-CM | POA: Insufficient documentation

## 2024-06-07 DIAGNOSIS — Z09 Encounter for follow-up examination after completed treatment for conditions other than malignant neoplasm: Secondary | ICD-10-CM | POA: Diagnosis not present

## 2024-06-07 DIAGNOSIS — R937 Abnormal findings on diagnostic imaging of other parts of musculoskeletal system: Secondary | ICD-10-CM

## 2024-06-07 DIAGNOSIS — M47812 Spondylosis without myelopathy or radiculopathy, cervical region: Secondary | ICD-10-CM

## 2024-06-07 DIAGNOSIS — S22000S Wedge compression fracture of unspecified thoracic vertebra, sequela: Secondary | ICD-10-CM | POA: Diagnosis not present

## 2024-06-07 DIAGNOSIS — M546 Pain in thoracic spine: Secondary | ICD-10-CM

## 2024-06-07 DIAGNOSIS — M792 Neuralgia and neuritis, unspecified: Secondary | ICD-10-CM | POA: Diagnosis not present

## 2024-06-07 DIAGNOSIS — G8929 Other chronic pain: Secondary | ICD-10-CM | POA: Insufficient documentation

## 2024-06-07 NOTE — Patient Instructions (Addendum)
 ______________________________________________________________________    General Risks and Possible Complications  Patient Responsibilities: It is important that you read this as it is part of your informed consent. It is our duty to inform you of the risks and possible complications associated with treatments offered to you. It is your responsibility as a patient to read this and to ask questions about anything that is not clear or that you believe was not covered in this document.  Patient's Rights: You have the right to refuse treatment. You also have the right to change your mind, even after initially having agreed to have the treatment done. However, under this last option, if you wait until the last second to change your mind, you may be charged for the materials used up to that point.  Introduction: Medicine is not an Visual merchandiser. Everything in Medicine, including the lack of treatment(s), carries the potential for danger, harm, or loss (which is by definition: Risk). In Medicine, a complication is a secondary problem, condition, or disease that can aggravate an already existing one. All treatments carry the risk of possible complications. The fact that a side effects or complications occurs, does not imply that the treatment was conducted incorrectly. It must be clearly understood that these can happen even when everything is done following the highest safety standards.  No treatment: You can choose not to proceed with the proposed treatment alternative. The "PRO(s)" would include: avoiding the risk of complications associated with the therapy. The "CON(s)" would include: not getting any of the treatment benefits. These benefits fall under one of three categories: diagnostic; therapeutic; and/or palliative. Diagnostic benefits include: getting information which can ultimately lead to improvement of the disease or symptom(s). Therapeutic benefits are those associated with the successful  treatment of the disease. Finally, palliative benefits are those related to the decrease of the primary symptoms, without necessarily curing the condition (example: decreasing the pain from a flare-up of a chronic condition, such as incurable terminal cancer).  General Risks and Complications: These are associated to most interventional treatments. They can occur alone, or in combination. They fall under one of the following six (6) categories: no benefit or worsening of symptoms; bleeding; infection; nerve damage; allergic reactions; and/or death. No benefits or worsening of symptoms: In Medicine there are no guarantees, only probabilities. No healthcare provider can ever guarantee that a medical treatment will work, they can only state the probability that it may. Furthermore, there is always the possibility that the condition may worsen, either directly, or indirectly, as a consequence of the treatment. Bleeding: This is more common if the patient is taking a blood thinner, either prescription or over the counter (example: Goody Powders, Fish oil, Aspirin, Garlic, etc.), or if suffering a condition associated with impaired coagulation (example: Hemophilia, cirrhosis of the liver, low platelet counts, etc.). However, even if you do not have one on these, it can still happen. If you have any of these conditions, or take one of these drugs, make sure to notify your treating physician. Infection: This is more common in patients with a compromised immune system, either due to disease (example: diabetes, cancer, human immunodeficiency virus [HIV], etc.), or due to medications or treatments (example: therapies used to treat cancer and rheumatological diseases). However, even if you do not have one on these, it can still happen. If you have any of these conditions, or take one of these drugs, make sure to notify your treating physician. Nerve Damage: This is more common when the treatment is  an invasive one, but it  can also happen with the use of medications, such as those used in the treatment of cancer. The damage can occur to small secondary nerves, or to large primary ones, such as those in the spinal cord and brain. This damage may be temporary or permanent and it may lead to impairments that can range from temporary numbness to permanent paralysis and/or brain death. Allergic Reactions: Any time a substance or material comes in contact with our body, there is the possibility of an allergic reaction. These can range from a mild skin rash (contact dermatitis) to a severe systemic reaction (anaphylactic reaction), which can result in death. Death: In general, any medical intervention can result in death, most of the time due to an unforeseen complication. ______________________________________________________________________     Facet Blocks Patient Information  Description: The facets are joints in the spine between the vertebrae.  Like any joints in the body, facets can become irritated and painful.  Arthritis can also effect the facets.  By injecting steroids and local anesthetic in and around these joints, we can temporarily block the nerve supply to them.  Steroids act directly on irritated nerves and tissues to reduce selling and inflammation which often leads to decreased pain.  Facet blocks may be done anywhere along the spine from the neck to the low back depending upon the location of your pain.   After numbing the skin with local anesthetic (like Novocaine), a small needle is passed onto the facet joints under x-ray guidance.  You may experience a sensation of pressure while this is being done.  The entire block usually lasts about 15-25 minutes.   Conditions which may be treated by facet blocks:  Low back/buttock pain Neck/shoulder pain Certain types of headaches  Preparation for the injection:  Do not eat any solid food or dairy products within 8 hours of your appointment. You may drink  clear liquid up to 3 hours before appointment.  Clear liquids include water , black coffee, juice or soda.  No milk or cream please. You may take your regular medication, including pain medications, with a sip of water  before your appointment.  Diabetics should hold regular insulin (if taken separately) and take 1/2 normal NPH dose the morning of the procedure.  Carry some sugar containing items with you to your appointment. A driver must accompany you and be prepared to drive you home after your procedure. Bring all your current medications with you. An IV may be inserted and sedation may be given at the discretion of the physician. A blood pressure cuff, EKG and other monitors will often be applied during the procedure.  Some patients may need to have extra oxygen administered for a short period. You will be asked to provide medical information, including your allergies and medications, prior to the procedure.  We must know immediately if you are taking blood thinners (like Coumadin/Warfarin) or if you are allergic to IV iodine contrast (dye).  We must know if you could possible be pregnant.  Possible side-effects:  Bleeding from needle site Infection (rare, may require surgery) Nerve injury (rare) Numbness & tingling (temporary) Difficulty urinating (rare, temporary) Spinal headache (a headache worse with upright posture) Light-headedness (temporary) Pain at injection site (serveral days) Decreased blood pressure (rare, temporary) Weakness in arm/leg (temporary) Pressure sensation in back/neck (temporary)   Call if you experience:  Fever/chills associated with headache or increased back/neck pain Headache worsened by an upright position New onset, weakness or numbness of an  extremity below the injection site Hives or difficulty breathing (go to the emergency room) Inflammation or drainage at the injection site(s) Severe back/neck pain greater than usual New symptoms which are  concerning to you  Please note:  Although the local anesthetic injected can often make your back or neck feel good for several hours after the injection, the pain will likely return. It takes 3-7 days for steroids to work.  You may not notice any pain relief for at least one week.  If effective, we will often do a series of 2-3 injections spaced 3-6 weeks apart to maximally decrease your pain.  After the initial series, you may be a candidate for a more permanent nerve block of the facets.  If you have any questions, please call #[phone] [facility] ______________________________________________________________________    Procedure instructions  Stop blood-thinners  Do not eat or drink fluids (other than water ) for 6 hours before your procedure  No water  for 2 hours before your procedure  Take your blood pressure medicine with a sip of water   Arrive 30 minutes before your appointment  If sedation is planned, bring suitable driver. [place], [place], & public transportation are NOT APPROVED)  Carefully read the Preparing for your procedure detailed instructions  If you have questions call us  at [phone]  Procedure appointments are for procedures only.   NO medication refills or new problem evaluations will be done on procedure days.   Only the scheduled, pre-approved procedure and side will be done.   ______________________________________________________________________      ______________________________________________________________________    Preparing for your procedure  Appointments: If you think you may not be able to keep your appointment, call 24-48 hours in advance to cancel. We need time to make it available to others.  Procedure visits are for procedures only. During your procedure appointment there will be: NO Prescription Refills*. NO medication changes or discussions*. NO discussion of disability issues*. NO  unrelated pain problem evaluations*. NO evaluations to order other pain procedures*. *These will be addressed at a separate and distinct evaluation encounter on the provider's evaluation schedule and not during procedure days.  Instructions: Food intake: Avoid eating anything solid for at least 8 hours prior to your procedure. Clear liquid intake: You may take clear liquids such as water  up to 2 hours prior to your procedure. (No carbonated drinks. No soda.) Transportation: Unless otherwise stated by your physician, bring a driver. (Driver cannot be a Market researcher, Pharmacist, community, or any other form of public transportation.) Morning Medicines: Except for blood thinners, take all of your other morning medications with a sip of water . Make sure to take your heart and blood pressure medicines. If your blood pressure's lower number is above 100, the case will be rescheduled. Blood thinners: Make sure to stop your blood thinners as instructed.  If you take a blood thinner, but were not instructed to stop it, call our office [phone] and ask to talk to a nurse. Not stopping a blood thinner prior to certain procedures could lead to serious complications. Diabetics on insulin: Notify the staff so that you can be scheduled 1st case in the morning. If your diabetes requires high dose insulin, take only  of your normal insulin dose the morning of the procedure and notify the staff that you have done so. Preventing infections: Shower with an antibacterial soap the morning of your procedure.  Build-up your immune system: Take 1000 mg of Vitamin C with every meal (3  times a day) the day prior to your procedure. Antibiotics: Inform the nursing staff if you are taking any antibiotics or if you have any conditions that may require antibiotics prior to procedures. (Example: recent joint implants)   Pregnancy: If you are pregnant make sure to notify the nursing staff. Not doing so may result in injury to the fetus, including death.   Sickness: If you have a cold, fever, or any active infections, call and cancel or reschedule your procedure. Receiving steroids while having an infection may result in complications. Arrival: You must be in the facility at least 30 minutes prior to your scheduled procedure. Tardiness: Your scheduled time is also the cutoff time. If you do not arrive at least 15 minutes prior to your procedure, you will be rescheduled.  Children: Do not bring any children with you. Make arrangements to keep them home. Dress appropriately: There is always a possibility that your clothing may get soiled. Avoid long dresses. Valuables: Do not bring any jewelry or valuables.  Reasons to call and reschedule or cancel your procedure: (Following these recommendations will minimize the risk of a serious complication.) Surgeries: Avoid having procedures within 2 weeks of any surgery. (Avoid for 2 weeks before or after any surgery). Flu Shots: Avoid having procedures within 2 weeks of a flu shots or . (Avoid for 2 weeks before or after immunizations). Barium: Avoid having a procedure within 7-10 days after having had a radiological study involving the use of radiological contrast. (Myelograms, Barium swallow or enema study). Heart attacks: Avoid any elective procedures or surgeries for the initial 6 months after a Myocardial Infarction (Heart Attack). Blood thinners: It is imperative that you stop these medications before procedures. Let us  know if you if you take any blood thinner.  Infection: Avoid procedures during or within two weeks of an infection (including chest colds or gastrointestinal problems). Symptoms associated with infections include: Localized redness, fever, chills, night sweats or profuse sweating, burning sensation when voiding, cough, congestion, stuffiness, runny nose, sore throat, diarrhea, nausea, vomiting, cold or Flu symptoms, recent or current infections. It is specially important if the infection is  over the area that we intend to treat. Heart and lung problems: Symptoms that may suggest an active cardiopulmonary problem include: cough, chest pain, breathing difficulties or shortness of breath, dizziness, ankle swelling, uncontrolled high or unusually low blood pressure, and/or palpitations. If you are experiencing any of these symptoms, cancel your procedure and contact your primary care physician for an evaluation.  Remember:  Regular Business hours are:  Monday to Thursday 8:00 AM to 4:00 PM  Provider's Schedule: Eric Como, MD:  Procedure days: Tuesday and Thursday 7:30 AM to 4:00 PM  Wallie Sherry, MD:  Procedure days: Monday and Wednesday 7:30 AM to 4:00 PM Last  Updated: 10/20/2023 ______________________________________________________________________      ______________________________________________________________________    General Risks and Possible Complications  Patient Responsibilities: It is important that you read this as it is part of your informed consent. It is our duty to inform you of the risks and possible complications associated with treatments offered to you. It is your responsibility as a patient to read this and to ask questions about anything that is not clear or that you believe was not covered in this document.  Patient's Rights: You have the right to refuse treatment. You also have the right to change your mind, even after initially having agreed to have the treatment done. However, under this last option, if you wait  until the last second to change your mind, you may be charged for the materials used up to that point.  Introduction: Medicine is not an Visual merchandiser. Everything in Medicine, including the lack of treatment(s), carries the potential for danger, harm, or loss (which is by definition: Risk). In Medicine, a complication is a secondary problem, condition, or disease that can aggravate an already existing one. All treatments carry the  risk of possible complications. The fact that a side effects or complications occurs, does not imply that the treatment was conducted incorrectly. It must be clearly understood that these can happen even when everything is done following the highest safety standards.  No treatment: You can choose not to proceed with the proposed treatment alternative. The "PRO(s)" would include: avoiding the risk of complications associated with the therapy. The "CON(s)" would include: not getting any of the treatment benefits. These benefits fall under one of three categories: diagnostic; therapeutic; and/or palliative. Diagnostic benefits include: getting information which can ultimately lead to improvement of the disease or symptom(s). Therapeutic benefits are those associated with the successful treatment of the disease. Finally, palliative benefits are those related to the decrease of the primary symptoms, without necessarily curing the condition (example: decreasing the pain from a flare-up of a chronic condition, such as incurable terminal cancer).  General Risks and Complications: These are associated to most interventional treatments. They can occur alone, or in combination. They fall under one of the following six (6) categories: no benefit or worsening of symptoms; bleeding; infection; nerve damage; allergic reactions; and/or death. No benefits or worsening of symptoms: In Medicine there are no guarantees, only probabilities. No healthcare provider can ever guarantee that a medical treatment will work, they can only state the probability that it may. Furthermore, there is always the possibility that the condition may worsen, either directly, or indirectly, as a consequence of the treatment. Bleeding: This is more common if the patient is taking a blood thinner, either prescription or over the counter (example: Goody Powders, Fish oil, Aspirin, Garlic, etc.), or if suffering a condition associated with impaired  coagulation (example: Hemophilia, cirrhosis of the liver, low platelet counts, etc.). However, even if you do not have one on these, it can still happen. If you have any of these conditions, or take one of these drugs, make sure to notify your treating physician. Infection: This is more common in patients with a compromised immune system, either due to disease (example: diabetes, cancer, human immunodeficiency virus [HIV], etc.), or due to medications or treatments (example: therapies used to treat cancer and rheumatological diseases). However, even if you do not have one on these, it can still happen. If you have any of these conditions, or take one of these drugs, make sure to notify your treating physician. Nerve Damage: This is more common when the treatment is an invasive one, but it can also happen with the use of medications, such as those used in the treatment of cancer. The damage can occur to small secondary nerves, or to large primary ones, such as those in the spinal cord and brain. This damage may be temporary or permanent and it may lead to impairments that can range from temporary numbness to permanent paralysis and/or brain death. Allergic Reactions: Any time a substance or material comes in contact with our body, there is the possibility of an allergic reaction. These can range from a mild skin rash (contact dermatitis) to a severe systemic reaction (anaphylactic reaction), which can result in  death. Death: In general, any medical intervention can result in death, most of the time due to an unforeseen complication. ______________________________________________________________________

## 2024-06-07 NOTE — Progress Notes (Signed)
 Safety precautions to be maintained throughout the outpatient stay will include: orient to surroundings, keep bed in low position, maintain call bell within reach at all times, provide assistance with transfer out of bed and ambulation.

## 2024-06-07 NOTE — Progress Notes (Signed)
 PROVIDER NOTE: Interpretation of information contained herein should be left to medically-trained personnel. Specific patient instructions are provided elsewhere under Patient Instructions section of medical record. This document was created in part using AI and STT-dictation technology, any transcriptional errors that may result from this process are unintentional.  Patient: Miranda Fleming  Service: E/M   PCP: System, Provider Not In  DOB: 04/01/1966  DOS: 06/07/2024  Provider: Eric DELENA Como, MD  MRN: [id number]  Delivery: Face-to-face  Specialty: Interventional Pain Management  Type: Established Patient  Setting: Ambulatory outpatient facility  Specialty designation: 09  Referring Prov.: No ref. provider found  Location: Outpatient office facility       History of present illness (HPI) [name], a 58 y.o. year old female, is here today because of her Cervicalgia [M54.2]. [name] primary complain today is Neck Pain (Left side is worse) and Back Pain (Mid back pain between shoulder blades )  Pertinent problems: Miranda Fleming has Non-traumatic compression fracture of T1 thoracic vertebra, sequela; Pain in right knee; Carpal tunnel syndrome of right wrist; Median neuropathy; Migraine; Neuropathy; Right radial nerve palsy; Ulnar nerve palsy; Chronic pain syndrome; Dupuytren's disease of palm of hands (Bilateral); Chronic thoracic back pain (1ry area of Pain) (Midline); Thoracic spine pain; Osteopenia determined by x-ray; Left cervical radiculopathy; Right cervical radiculopathy; Cervicalgia; Obstruction of right vertebral artery; Stenosis of right vertebral artery; Ulnar neuritis, left; Arthralgia of both knees; Other spondylosis, thoracic region; Abnormal MRI, thoracic spine (02/16/2024); Abnormal MRI, cervical spine (02/16/2024 & 03/23/2024); Non-traumatic compression fracture of T2 thoracic vertebra, sequela; Non-traumatic compression fracture of T3 thoracic vertebra,  sequela; Non-traumatic compression fracture of T4 thoracic vertebra, sequela; Compression of thoracic vertebra, sequela (T1, T2, T3, T4); DDD (degenerative disc disease), thoracic; Thoracic vertebrogenic pain; Thoracic facet arthropathy; Thoracic facet joint pain; Grade 1 Anterolisthesis of thoracic spine (T2-3); Grade 1 Anterolisthesis of cervical spine (C7-T1); DDD (degenerative disc disease), cervical; Cervical spondylosis; Compression fracture of thoracic vertebra with routine healing; Lumbar spine pain; Cervicogenic headache (Bilateral); Cervical facet joint pain (Multilevel) (Bilateral); Cervical facet joint arthropathy; Cervical facet syndrome; Spondylosis without myelopathy or radiculopathy, cervical region; Burning pain (cervical) (Left); and Neurogenic pain (cervical) (Bilateral) (L>R) on their pertinent problem list.  Pain Assessment: Severity of Chronic pain is reported as a 9 /10. Location: Neck Left/? into between shoulder blades, worse with ROM and is limited when turning to the right. Onset: More than a month ago. Quality: Burning, Discomfort, Aching. Timing: Intermittent. Modifying factor(s): neck brace prescribed by ortho and when she is wearing the pain is reduced a little and she states it feels supportive. Vitals:  height is 5' 5 (1.651 m) and weight is 109 lb (49.4 kg). Her temporal temperature is 97.3 F (36.3 C) (abnormal). Her blood pressure is 115/70 and her pulse is 88. Her respiration is 16 and oxygen saturation is 99%.  BMI: Estimated body mass index is 18.14 kg/m as calculated from the following:   Height as of this encounter: 5' 5 (1.651 m).   Weight as of this encounter: 109 lb (49.4 kg).  Last encounter: 05/11/2024. Last procedure: 05/24/2024.  Reason for encounter: post-procedure evaluation and assessment.   Discussed the use of AI scribe software for clinical note transcription with the patient, who gave verbal consent to proceed.  History of Present Illness    Miranda Fleming is a 58 year old female who presents with persistent neck and shoulder pain following a bilateral cervical facet block.  She underwent a  bilateral cervical facet block. The procedure involved the use of numbing medication, and during the period of numbness, she experienced no neck pain, which lasted approximately twelve hours.  Following the procedure, the pain returned the next morning, initially at a level of three out of ten. Over the subsequent days, the pain progressively worsened, particularly when she rotated her neck to the right. The pain is located on the left side, at the top of the back of her head near the occipital ridge, and extends slightly into her shoulder.  Prior to the procedure, she experienced both neck and shoulder pain, which temporarily resolved during the numbing phase of the facet block.      Post-Procedure Evaluation   Procedure: Cervical Facet Medial Branch Block(s) #1  Laterality: Bilateral  Level: TON, C3, C4, C5, and C6 Medial Branch Level(s). Injecting these levels blocks the C2-3, C3-4, C4-5, and C5-6 cervical facet joints.  Imaging: Fluoroscopic guidance Anesthesia: Local anesthesia (1-2% Lidocaine ) Anxiolysis: IV Versed  3.5 mg Sedation: Moderate Sedation Fentanyl  1 mL (50 mcg) DOS: 05/24/2024  Performed by: Eric DELENA Como, MD  Purpose: Diagnostic/Therapeutic Indications: Cervicalgia (cervical spine axial pain) severe enough to impact quality of life or function. 1. Cervicalgia   2. Cervical facet joint pain (Multilevel) (Bilateral)   3. Grade 1 Anterolisthesis of cervical spine (C7-T1)   4. Cervical facet joint arthropathy   5. Cervical facet syndrome   6. Cervicogenic headache (Bilateral)   7. Spondylosis without myelopathy or radiculopathy, cervical region   8. DDD (degenerative disc disease), cervical   9. Cervical spondylosis   10. Abnormal MRI, cervical spine (02/16/2024)   11. Latex precautions, history of latex  allergy    NAS-11 Pain score:   Pre-procedure: 9 /10   Post-procedure: 0-No pain/10     Effectiveness:  Initial hour after procedure: 100 %. Subsequent 4-6 hours post-procedure: 100 %. Analgesia past initial 6 hours: 100 % (pain relief was good while numb.  no longterm relief. neck pain is back to same severity, pain between shoulder blades and she is having lower back pain). Ongoing improvement:  Analgesic: The patient indicates having attained 100% relief of the pain for the duration of the local anesthetic followed by an ongoing 45 to 50% improvement on the right side and approximately 5% improvement on the left.  She also indicates having improved range of motion on the right side more than the left although she still has painful range of motion of her cervical spine. Function: Somewhat improved (R>L) ROM: Somewhat improved (R>L)  Pharmacotherapy Assessment   Analgesic: No chronic opioid analgesics therapy prescribed by our practice. Tramadol 50 mg tablet, 1 tab p.o. daily (# 30) (30/month) (last filled on 11/28/2023) MME/day: 10 mg/day   Monitoring: [place] PMP: PDMP reviewed during this encounter.       Pharmacotherapy: No side-effects or adverse reactions reported. Compliance: No problems identified. Effectiveness: Clinically acceptable.  Miranda Chrissie MATSU, RN  06/07/2024 12:39 PM  Sign when Signing Visit Safety precautions to be maintained throughout the outpatient stay will include: orient to surroundings, keep bed in low position, maintain call bell within reach at all times, provide assistance with transfer out of bed and ambulation.     UDS:  Summary  Date Value Ref Range Status  12/07/2023 FINAL  Final    Comment:    ==================================================================== Compliance Drug Analysis, Ur ==================================================================== Specimen Alert Not Detected result may be consistent with the time of last use noted  for this medication. AS NEEDED (Tramadol) ====================================================================  Test                             Result       Flag       Units  Drug Present and Declared for Prescription Verification   Alprazolam                      419          EXPECTED   ng/mg creat   Alpha-hydroxyalprazolam        758          EXPECTED   ng/mg creat    Source of alprazolam  is a scheduled prescription medication. Alpha-    hydroxyalprazolam is an expected metabolite of alprazolam .  Drug Present not Declared for Prescription Verification   Ibuprofen                       PRESENT      UNEXPECTED   Diphenhydramine                 PRESENT      UNEXPECTED  Drug Absent but Declared for Prescription Verification   Tramadol                       Not Detected UNEXPECTED ng/mg creat ==================================================================== Test                      Result    Flag   Units      Ref Range   Creatinine              31               mg/dL      >=79 ==================================================================== Declared Medications:  The flagging and interpretation on this report are based on the  following declared medications.  Unexpected results may arise from  inaccuracies in the declared medications.   **Note: The testing scope of this panel includes these medications:   Alprazolam  (Xanax )  Tramadol (Ultram)   **Note: The testing scope of this panel does not include the  following reported medications:   Calcium  Estradiol  (Estrace )  Fish Oil  Multivitamin  Supplement  Vitamin B12 ==================================================================== For clinical consultation, please call [phone]. ====================================================================     No results found for: CBDTHCR No results found for: D8THCCBX No results found for: D9THCCBX  ROS  Constitutional: Denies any fever or  chills Gastrointestinal: No reported hemesis, hematochezia, vomiting, or acute GI distress Musculoskeletal: Denies any acute onset joint swelling, redness, loss of ROM, or weakness Neurological: No reported episodes of acute onset apraxia, aphasia, dysarthria, agnosia, amnesia, paralysis, loss of coordination, or loss of consciousness  Medication Review  ALPRAZolam , Bone Essentials, Fish Oil, Progesterone, alendronate, calcium carbonate, cyanocobalamin , estradiol , gabapentin , multivitamin, and traMADol  History Review  Allergy: [name] is allergic to amoxicillin and latex. Drug: [name]  has no history on file for drug use. Alcohol:  reports current alcohol use of about 4.0 standard drinks of alcohol per week. Tobacco:  reports that she quit smoking about 2 years ago. Her smoking use included cigarettes. She started smoking about 43 years ago. She has a 61.6 pack-year smoking history. She has never used smokeless tobacco. Social: [name]  reports that she quit smoking about 2 years ago. Her smoking use included cigarettes. She started smoking about 43 years ago. She  has a 61.6 pack-year smoking history. She has never used smokeless tobacco. She reports current alcohol use of about 4.0 standard drinks of alcohol per week. Medical:  has a past medical history of DVT, lower extremity, distal, acute (HCC), Palpitations (11/23/2015), SUI (stress urinary incontinence, female), and Wears glasses. Surgical: [name]  has a past surgical history that includes EXPLORATORY LAPAROTOMY W/  LEFT SALPINGOOPHORECTOMY (01-25-2001); Abdominal hysterectomy (1995); LAPAROSCOPY'S AND LAPAROTOMY'S FOR PERSISTANT OVARIAN CYST (X 5  1987;  1989;  1990;  1993;  1994); and Pubovaginal sling (N/A, 10/31/2013). Family: family history includes Breast cancer in her mother; Colon cancer in her paternal grandfather; Diabetes in her maternal aunt; Healthy in her brother and father; Osteopenia in her sister;  Osteoporosis in her mother; Ovarian cancer in her maternal grandmother and mother.  Laboratory Chemistry Profile   Renal Lab Results  Component Value Date   BUN 10 01/10/2024   CREATININE 0.43 (L) 01/10/2024   BCR 19 12/07/2023   GFRAA >60 12/18/2017   GFRNONAA >60 01/10/2024    Hepatic Lab Results  Component Value Date   AST 19 01/10/2024   ALT 22 01/10/2024   ALBUMIN 3.5 01/10/2024   ALKPHOS 45 01/10/2024   LIPASE 35 05/24/2021   AMMONIA <10 01/09/2024    Electrolytes Lab Results  Component Value Date   NA 135 01/10/2024   K 3.1 (L) 01/10/2024   CL 106 01/10/2024   CALCIUM 8.5 (L) 01/10/2024   MG 1.8 01/10/2024    Bone Lab Results  Component Value Date   VD25OH 50.8 12/18/2017   25OHVITD1 68 12/07/2023   25OHVITD2 41 12/07/2023   25OHVITD3 27 12/07/2023    Inflammation (CRP: Acute Phase) (ESR: Chronic Phase) Lab Results  Component Value Date   CRP <1 12/07/2023   ESRSEDRATE 3 12/07/2023   LATICACIDVEN 1.4 01/09/2024         Note: Above Lab results reviewed.  Recent Imaging Review  DG PAIN CLINIC C-ARM 1-60 MIN NO REPORT Fluoro was used, but no Radiologist interpretation will be provided.  Please refer to NOTES tab for provider progress note. Note: Reviewed        Physical Exam  Vitals: BP 115/70 (BP Location: Right Arm, Patient Position: Sitting, Cuff Size: Small)   Pulse 88   Temp (!) 97.3 F (36.3 C) (Temporal)   Resp 16   Ht 5' 5 (1.651 m)   Wt 109 lb (49.4 kg)   SpO2 99%   BMI 18.14 kg/m  BMI: Estimated body mass index is 18.14 kg/m as calculated from the following:   Height as of this encounter: 5' 5 (1.651 m).   Weight as of this encounter: 109 lb (49.4 kg). Ideal: Ideal body weight: 57 kg (125 lb 10.6 oz) General appearance: Well nourished, well developed, and well hydrated. In no apparent acute distress Mental status: Alert, oriented x 3 (person, place, & time)       Respiratory: No evidence of acute respiratory distress Eyes:  PERLA   Assessment   Diagnosis Status  1. Cervicalgia   2. Cervical facet joint pain (Multilevel) (Bilateral)   3. Cervical facet syndrome   4. Postop check   5. Cervical facet joint arthropathy   6. Grade 1 Anterolisthesis of cervical spine (C7-T1)   7. DDD (degenerative disc disease), cervical   8. Compression of thoracic vertebra, sequela (T1, T2, T3, T4)   9. Chronic thoracic back pain (1ry area of Pain) (Midline)   10. Abnormal MRI, cervical spine (02/16/2024 &  03/23/2024)   11. Abnormal MRI, thoracic spine (02/16/2024)   12. Burning pain (cervical) (Left)   13. 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 Numbing medicine provided approximately twelve hours of relief, but pain returned the next morning at a level of three and worsened over time. Pain is exacerbated by rotating the neck to the right and is primarily located at the occipital ridge and slightly down into the shoulder.       [name] has a current medication list which includes the following long-term medication(s): calcium carbonate, gabapentin , and progesterone.  Pharmacotherapy (Medications Ordered): No orders of the defined types were placed in this encounter.  Orders:  Orders Placed This Encounter  Procedures   CERVICAL FACET (MEDIAL BRANCH NERVE BLOCK)     Standing Status:   Standing    Number of Occurrences:   1    Expiration Date:   12/08/2024    Scheduling Instructions:     Procedure: Cervical facet Block     Type: Medial Branch Block     Side: Bilateral     Purpose: Diagnostic     Level(s): C2-3, C3-4,  C4-5, C5-6, and C6-7 Facet joints (TON, C3, C4, C5, C6, and C7 Medial Branch Nerves)     Sedation: Moderate Conscious Sedation (ECT)     Timeframe: PRN (patient will call)    Where will this procedure be performed?:   [facility]   Ambulatory referral to Neurosurgery    Referral Priority:   Routine    Referral Type:   Surgical    Referral Reason:   Specialty Services Required    Referred to Provider:   Claudene Penne ORN, MD    Requested Specialty:   Neurosurgery    Number of Visits Requested:   1   Nursing Instructions:    Please complete this patient's postprocedure evaluation.    Scheduling Instructions:     Please complete this patient's postprocedure evaluation.     Interventional Therapies  Risk Factors  Considerations  Medical Comorbidities:  Allergy: LATEX  Hx. DVT     Planned  Pending:       Under consideration:      Completed:   Diagnostic/therapeutic bilateral cervical facet (TON-C6) MBB x1 (05/24/2024) (100/100/0/0)  Diagnostic/therapeutic midline (T2-3) thoracic ESI x1 (12/24/2023) (100/100/5/RUE:100LUE:90UB:5-10)  Therapeutic left (T2-3) thoracic ESI x1 (03/10/2024) (100/100/90x36hrs/45)  Therapeutic left (T3-4) thoracic ESI x1 (04/07/2024) (DNKFU-Returned 05/11/2024) (0/0/0/0)    Therapeutic  Palliative (PRN) options:   None established   Completed by other providers:   None reported     Return if symptoms worsen or fail to improve, for (PRN)(ECT): (B) C-FCT Blk #2.    Recent Visits Date Type Provider Dept  05/24/24 Procedure visit Tanya Glisson, MD [facility]  05/11/24 Office Visit Tanya Glisson, MD [facility]  04/07/24 Procedure visit Tanya Glisson, MD [facility]  03/23/24 Office Visit Tanya Glisson, MD [facility]  03/10/24 Procedure visit Tanya Glisson, MD [facility]  Showing recent visits within past 90 days and meeting all other requirements Today's  Visits Date Type Provider Dept  06/07/24 Office  Visit Kimberley Dastrup, MD [facility]  Showing today's visits and meeting all other requirements Future Appointments No visits were found meeting these conditions. Showing future appointments within next 90 days and meeting all other requirements  I discussed the assessment and treatment plan with the patient. The patient was provided an opportunity to ask questions and all were answered. The patient agreed with the plan and demonstrated an understanding of the instructions.  Patient advised to call back or seek an in-person evaluation if the symptoms or condition worsens.  Duration of encounter: 35 minutes.  Total time on encounter, as per AMA guidelines included both the face-to-face and non-face-to-face time personally spent by the physician and/or other qualified health care professional(s) on the day of the encounter (includes time in activities that require the physician or other qualified health care professional and does not include time in activities normally performed by clinical staff). Physician's time may include the following activities when performed: Preparing to see the patient (e.g., pre-charting review of records, searching for previously ordered imaging, lab work, and nerve conduction tests) Review of prior analgesic pharmacotherapies. Reviewing PMP Interpreting ordered tests (e.g., lab work, imaging, nerve conduction tests) Performing post-procedure evaluations, including interpretation of diagnostic procedures Obtaining and/or reviewing separately obtained history Performing a medically appropriate examination and/or evaluation Counseling and educating the patient/family/caregiver Ordering medications, tests, or procedures Referring and communicating with other health care professionals (when not separately reported) Documenting clinical information in the electronic or other health record Independently  interpreting results (not separately reported) and communicating results to the patient/ family/caregiver Care coordination (not separately reported)  Note by: Eric DELENA Como, MD (TTS and AI technology used. I apologize for any typographical errors that were not detected and corrected.) Date: 06/07/2024; Time: 3:59 PM

## 2024-06-20 DIAGNOSIS — M542 Cervicalgia: Secondary | ICD-10-CM | POA: Diagnosis not present

## 2024-06-20 DIAGNOSIS — M546 Pain in thoracic spine: Secondary | ICD-10-CM | POA: Diagnosis not present

## 2024-06-20 DIAGNOSIS — S22000A Wedge compression fracture of unspecified thoracic vertebra, initial encounter for closed fracture: Secondary | ICD-10-CM | POA: Diagnosis not present

## 2024-07-04 ENCOUNTER — Ambulatory Visit: Admitting: Neurosurgery

## 2024-07-18 ENCOUNTER — Encounter: Payer: Self-pay | Admitting: Orthopedic Surgery

## 2024-07-18 ENCOUNTER — Other Ambulatory Visit: Payer: Self-pay | Admitting: Orthopedic Surgery

## 2024-07-18 ENCOUNTER — Encounter: Payer: Self-pay | Admitting: Gastroenterology

## 2024-07-18 DIAGNOSIS — M546 Pain in thoracic spine: Secondary | ICD-10-CM

## 2024-07-19 ENCOUNTER — Encounter: Payer: Self-pay | Admitting: Orthopedic Surgery

## 2024-07-20 ENCOUNTER — Ambulatory Visit
Admission: RE | Admit: 2024-07-20 | Discharge: 2024-07-20 | Disposition: A | Discharge status: Home / Self Care | Source: Ambulatory Visit | Attending: Orthopedic Surgery | Admitting: Orthopedic Surgery

## 2024-07-20 DIAGNOSIS — M546 Pain in thoracic spine: Secondary | ICD-10-CM

## 2024-08-21 NOTE — Progress Notes (Unsigned)
 PROVIDER NOTE: Interpretation of information contained herein should be left to medically-trained personnel. Specific patient instructions are provided elsewhere under Patient Instructions section of medical record. This document was created in part using AI and STT-dictation technology, any transcriptional errors that may result from this process are unintentional.  Patient: Miranda Fleming  Service: E/M   PCP: System, Provider Not In  DOB: Jun 30, 1966  DOS: 08/22/2024  Provider: Eric DELENA Como, MD  MRN: [id number]  Delivery: Face-to-face  Specialty: Interventional Pain Management  Type: Established Patient  Setting: Ambulatory outpatient facility  Specialty designation: 09  Referring Prov.: No ref. provider found  Location: Outpatient office facility       History of present illness (HPI) Miranda Fleming, a 58 y.o. year old female, is here today because of her Cervicalgia [M54.2]. Miranda Fleming primary complain today is No chief complaint on file.  Pertinent problems: [name] has Non-traumatic compression fracture of T1 thoracic vertebra, sequela; Pain in right knee; Carpal tunnel syndrome of right wrist; Median neuropathy; Migraine; Neuropathy; Right radial nerve palsy; Ulnar nerve palsy; Chronic pain syndrome; Dupuytren's disease of palm of hands (Bilateral); Chronic thoracic back pain (1ry area of Pain) (Midline); Thoracic spine pain; Osteopenia determined by x-ray; Left cervical radiculopathy; Right cervical radiculopathy; Cervicalgia; Obstruction of right vertebral artery; Stenosis of right vertebral artery; Ulnar neuritis, left; Arthralgia of both knees; Other spondylosis, thoracic region; Abnormal MRI, thoracic spine (02/16/2024); Abnormal MRI, cervical spine (02/16/2024 & 03/23/2024); Non-traumatic compression fracture of T2 thoracic vertebra, sequela; Non-traumatic compression fracture of T3 thoracic vertebra, sequela; Non-traumatic compression fracture of T4 thoracic  vertebra, sequela; Compression of thoracic vertebra, sequela (T1, T2, T3, T4); DDD (degenerative disc disease), thoracic; Thoracic vertebrogenic pain; Thoracic facet arthropathy; Thoracic facet joint pain; Grade 1 Anterolisthesis of thoracic spine (T2-3); Grade 1 Anterolisthesis of cervical spine (C7-T1); DDD (degenerative disc disease), cervical; Cervical spondylosis; Compression fracture of thoracic vertebra with routine healing; Lumbar spine pain; Cervicogenic headache (Bilateral); Cervical facet joint pain (Multilevel) (Bilateral); Cervical facet joint arthropathy; Cervical facet syndrome; Spondylosis without myelopathy or radiculopathy, cervical region; Burning pain (cervical) (Left); Neurogenic pain (cervical) (Bilateral) (L>R); Thoracic back pain; Neck pain; Compression fracture of thoracic vertebra (HCC); Arthralgia of left knee; and Arthralgia of right knee on their pertinent problem list.  Pain Assessment: Severity of   is reported as a  /10. Location:    / . Onset:  . Quality:  . Timing:  . Modifying factor(s):  Miranda Fleming Vitals:  vitals were not taken for this visit.  BMI: Estimated body mass index is 18.14 kg/m as calculated from the following:   Height as of 06/07/24: 5' 5 (1.651 m).   Weight as of 06/07/24: 109 lb (49.4 kg).  Last encounter: 06/07/2024. Last procedure: 05/24/2024.  Reason for encounter: evaluation of worsening, or previously known (established) problem.   Discussed the use of AI scribe software for clinical note transcription with the patient, who gave verbal consent to proceed.  History of Present Illness           Pharmacotherapy Assessment   Analgesic: No chronic opioid analgesics therapy prescribed by our practice. Tramadol 50 mg tablet, 1 tab p.o. daily (# 30) (30/month) (last filled on 11/28/2023) MME/day: 10 mg/day   Monitoring: [place] PMP: PDMP reviewed during this encounter.       Pharmacotherapy: No side-effects or adverse reactions reported. Compliance: No  problems identified. Effectiveness: Clinically acceptable.  No notes on file  UDS:  Summary  Date Value Ref Range Status  12/07/2023 FINAL  Final    Comment:    ==================================================================== Compliance Drug Analysis, Ur ==================================================================== Specimen Alert Not Detected result may be consistent with the time of last use noted for this medication. AS NEEDED (Tramadol) ==================================================================== Test                             Result       Flag       Units  Drug Present and Declared for Prescription Verification   Alprazolam                      419          EXPECTED   ng/mg creat   Alpha-hydroxyalprazolam        758          EXPECTED   ng/mg creat    Source of alprazolam  is a scheduled prescription medication. Alpha-    hydroxyalprazolam is an expected metabolite of alprazolam .  Drug Present not Declared for Prescription Verification   Ibuprofen                       PRESENT      UNEXPECTED   Diphenhydramine                 PRESENT      UNEXPECTED  Drug Absent but Declared for Prescription Verification   Tramadol                       Not Detected UNEXPECTED ng/mg creat ==================================================================== Test                      Result    Flag   Units      Ref Range   Creatinine              31               mg/dL      >=79 ==================================================================== Declared Medications:  The flagging and interpretation on this report are based on the  following declared medications.  Unexpected results may arise from  inaccuracies in the declared medications.   **Note: The testing scope of this panel includes these medications:   Alprazolam  (Xanax )  Tramadol (Ultram)   **Note: The testing scope of this panel does not include the  following reported medications:   Calcium  Estradiol   (Estrace )  Fish Oil  Multivitamin  Supplement  Vitamin B12 ==================================================================== For clinical consultation, please call [phone]. ====================================================================     No results found for: CBDTHCR No results found for: D8THCCBX No results found for: D9THCCBX  ROS  Constitutional: Denies any fever or chills Gastrointestinal: No reported hemesis, hematochezia, vomiting, or acute GI distress Musculoskeletal: Denies any acute onset joint swelling, redness, loss of ROM, or weakness Neurological: No reported episodes of acute onset apraxia, aphasia, dysarthria, agnosia, amnesia, paralysis, loss of coordination, or loss of consciousness  Medication Review  ALPRAZolam , Bone Essentials, Fish Oil, Progesterone, alendronate, calcium carbonate, cyanocobalamin , estradiol , gabapentin , multivitamin, and traMADol  History Review  Allergy: Ms. Rovito is allergic to amoxicillin and latex. Drug: Ms. Jelinek  has no history on file for drug use. Alcohol:  reports current alcohol use of about 4.0 standard drinks of alcohol per week. Tobacco:  reports that she quit smoking about 2 years ago. Her smoking use included cigarettes. She started smoking about 43 years ago.  She has a 61.6 pack-year smoking history. She has never used smokeless tobacco. Social: Ms. Longshore  reports that she quit smoking about 2 years ago. Her smoking use included cigarettes. She started smoking about 43 years ago. She has a 61.6 pack-year smoking history. She has never used smokeless tobacco. She reports current alcohol use of about 4.0 standard drinks of alcohol per week. Medical:  has a past medical history of DVT, lower extremity, distal, acute (HCC), Palpitations (11/23/2015), SUI (stress urinary incontinence, female), and Wears glasses. Surgical: Ms. Losh  has a past surgical history that includes EXPLORATORY LAPAROTOMY W/  LEFT  SALPINGOOPHORECTOMY (01-25-2001); Abdominal hysterectomy (1995); LAPAROSCOPY'S AND LAPAROTOMY'S FOR PERSISTANT OVARIAN CYST (X 5  1987;  1989;  1990;  1993;  1994); and Pubovaginal sling (N/A, 10/31/2013). Family: family history includes Breast cancer in her mother; Colon cancer in her paternal grandfather; Diabetes in her maternal aunt; Healthy in her brother and father; Osteopenia in her sister; Osteoporosis in her mother; Ovarian cancer in her maternal grandmother and mother.  Laboratory Chemistry Profile   Renal Lab Results  Component Value Date   BUN 10 01/10/2024   CREATININE 0.43 (L) 01/10/2024   BCR 19 12/07/2023   GFRAA >60 12/18/2017   GFRNONAA >60 01/10/2024    Hepatic Lab Results  Component Value Date   AST 19 01/10/2024   ALT 22 01/10/2024   ALBUMIN 3.5 01/10/2024   ALKPHOS 45 01/10/2024   LIPASE 35 05/24/2021   AMMONIA <10 01/09/2024    Electrolytes Lab Results  Component Value Date   NA 135 01/10/2024   K 3.1 (L) 01/10/2024   CL 106 01/10/2024   CALCIUM 8.5 (L) 01/10/2024   MG 1.8 01/10/2024    Bone Lab Results  Component Value Date   VD25OH 50.8 12/18/2017   25OHVITD1 68 12/07/2023   25OHVITD2 41 12/07/2023   25OHVITD3 27 12/07/2023    Inflammation (CRP: Acute Phase) (ESR: Chronic Phase) Lab Results  Component Value Date   CRP <1 12/07/2023   ESRSEDRATE 3 12/07/2023   LATICACIDVEN 1.4 01/09/2024         Note: Above Lab results reviewed.  Recent Imaging Review  CT THORACIC SPINE WO CONTRAST CLINICAL DATA:  History of thoracic compression fractures  EXAM: CT THORACIC SPINE WITHOUT CONTRAST  TECHNIQUE: Multidetector CT images of the thoracic were obtained using the standard protocol without intravenous contrast.  RADIATION DOSE REDUCTION: This exam was performed according to the departmental dose-optimization program which includes automated exposure control, adjustment of the mA and/or kV according to patient size and/or use of iterative  reconstruction technique.  COMPARISON:  CT 08/03/2023  FINDINGS: Alignment: Normal.  Vertebrae: Chronic mild superior endplate compression fractures of the T1 through T4 vertebral bodies without progression of vertebral body height loss. The remaining thoracic vertebral body heights are maintained. No new fractures. No lytic or sclerotic bone lesion.  Paraspinal and other soft tissues: No acute abnormality.  Disc levels: Minimal disc space narrowing and upper thoracic facet arthropathy, unchanged.  IMPRESSION: Chronic mild superior endplate compression fractures of the T1 through T4 vertebral bodies without progression of vertebral body height loss. No new fractures.  Electronically Signed   By: Mabel Converse D.O.   On: 07/27/2024 15:11 Note: Reviewed        Physical Exam  Vitals: There were no vitals taken for this visit. BMI: Estimated body mass index is 18.14 kg/m as calculated from the following:   Height as of 06/07/24: 5' 5 (1.651 m).  Weight as of 06/07/24: 109 lb (49.4 kg). Ideal: Patient weight not recorded General appearance: Well nourished, well developed, and well hydrated. In no apparent acute distress Mental status: Alert, oriented x 3 (person, place, & time)       Respiratory: No evidence of acute respiratory distress Eyes: PERLA   Assessment   Diagnosis Status  1. Cervicalgia   2. Cervical facet syndrome   3. Cervical facet joint pain (Multilevel) (Bilateral)   4. Cervical facet joint arthropathy   5. Cervicogenic headache (Bilateral)   6. Abnormal MRI, cervical spine (02/16/2024 & 03/23/2024)   7. Abnormal MRI, thoracic spine (02/16/2024)   8. Grade 1 Anterolisthesis of cervical spine (C7-T1)   9. Grade 1 Anterolisthesis of thoracic spine (T2-3)   10. Non-traumatic compression fracture of T1 thoracic vertebra, sequela   11. Non-traumatic compression fracture of T2 thoracic vertebra, sequela   12. Non-traumatic compression fracture of T3  thoracic vertebra, sequela   13. Non-traumatic compression fracture of T4 thoracic vertebra, sequela   14. Neurogenic pain (cervical) (Bilateral) (L>R)   15. DDD (degenerative disc disease), cervical   16. DDD (degenerative disc disease), thoracic   17. Thoracic spine pain   18. Thoracic vertebrogenic pain    Controlled Controlled Controlled   Updated Problems: Problem  Thoracic Back Pain  Compression Fracture of Thoracic Vertebra (Hcc)  Neck Pain  Arthralgia of Right Knee  Arthralgia of Left Knee    Plan of Care  Problem-specific:  Assessment and Plan            Ms. Ciclaly Mulcahey has a current medication list which includes the following long-term medication(s): calcium carbonate, gabapentin , and progesterone.  Pharmacotherapy (Medications Ordered): No orders of the defined types were placed in this encounter.  Orders:  No orders of the defined types were placed in this encounter.    Interventional Therapies  Risk Factors  Considerations  Medical Comorbidities:  Allergy: LATEX  Hx. DVT     Planned  Pending:       Under consideration:   Diagnostic midline cervical ESI #1    Completed:   Diagnostic/therapeutic bilateral cervical facet (TON-C6) MBB x1 (05/24/2024) (100/100/0/0)  Diagnostic/therapeutic midline (T2-3) thoracic ESI x1 (12/24/2023) (100/100/5/RUE:100LUE:90UB:5-10)  Therapeutic left (T2-3) thoracic ESI x1 (03/10/2024) (100/100/90x36hrs/45)  Therapeutic left (T3-4) thoracic ESI x1 (04/07/2024) (DNKFU-Returned 05/11/2024) (0/0/0/0)    Therapeutic  Palliative (PRN) options:   None established   Completed by other providers:   None reported     No follow-ups on file.    Recent Visits Date Type Provider Dept  06/07/24 Office Visit Tanya Glisson, MD [facility]  05/24/24 Procedure visit Tanya Glisson, MD [facility]  Showing recent visits within past 90 days and meeting all other requirements Future  Appointments Date Type Provider Dept  08/22/24 Appointment Tanya Glisson, MD [facility]  09/01/24 Appointment Tanya Glisson, MD [facility]  Showing future appointments within next 90 days and meeting all other requirements  I discussed the assessment and treatment plan with the patient. The patient was provided an opportunity to ask questions and all were answered. The patient agreed with the plan and demonstrated an understanding of the instructions.  Patient advised to call back or seek an in-person evaluation if the symptoms or condition worsens.  Duration of encounter: *** minutes.  Total time on encounter, as per AMA guidelines included both the face-to-face and non-face-to-face time personally spent by the physician and/or other qualified health care professional(s) on the  day of the encounter (includes time in activities that require the physician or other qualified health care professional and does not include time in activities normally performed by clinical staff). Physician's time may include the following activities when performed: Preparing to see the patient (e.g., pre-charting review of records, searching for previously ordered imaging, lab work, and nerve conduction tests) Review of prior analgesic pharmacotherapies. Reviewing PMP Interpreting ordered tests (e.g., lab work, imaging, nerve conduction tests) Performing post-procedure evaluations, including interpretation of diagnostic procedures Obtaining and/or reviewing separately obtained history Performing a medically appropriate examination and/or evaluation Counseling and educating the patient/family/caregiver Ordering medications, tests, or procedures Referring and communicating with other health care professionals (when not separately reported) Documenting clinical information in the electronic or other health record Independently interpreting results (not separately reported) and  communicating results to the patient/ family/caregiver Care coordination (not separately reported)  Note by: Eric DELENA Como, MD (TTS and AI technology used. I apologize for any typographical errors that were not detected and corrected.) Date: 08/22/2024; Time: 2:32 PM

## 2024-08-21 NOTE — Patient Instructions (Signed)
 ______________________________________________________________________    Procedure instructions  Stop blood-thinners  Do not eat or drink fluids (other than water ) for 6 hours before your procedure  No water  for 2 hours before your procedure  Take your blood pressure medicine with a sip of water   Arrive 30 minutes before your appointment  If sedation is planned, bring suitable driver. [place], [place], & public transportation are NOT APPROVED)  Carefully read the Preparing for your procedure detailed instructions  If you have questions call us  at [phone]  Procedure appointments are for procedures only.   NO medication refills or new problem evaluations will be done on procedure days.   Only the scheduled, pre-approved procedure and side will be done.   ______________________________________________________________________     ______________________________________________________________________    Preparing for your procedure  Appointments: If you think you may not be able to keep your appointment, call 24-48 hours in advance to cancel. We need time to make it available to others.  Procedure visits are for procedures only. During your procedure appointment there will be: NO Prescription Refills*. NO medication changes or discussions*. NO discussion of disability issues*. NO unrelated pain problem evaluations*. NO evaluations to order other pain procedures*. *These will be addressed at a separate and distinct evaluation encounter on the provider's evaluation schedule and not during procedure days.  Instructions: Food intake: Avoid eating anything solid for at least 8 hours prior to your procedure. Clear liquid intake: You may take clear liquids such as water  up to 2 hours prior to your procedure. (No carbonated drinks. No soda.) Transportation: Unless otherwise stated by your physician, bring a driver. (Driver cannot be a Market researcher, Pharmacist, community, or any other form of public  transportation.) Morning Medicines: Except for blood thinners, take all of your other morning medications with a sip of water . Make sure to take your heart and blood pressure medicines. If your blood pressure's lower number is above 100, the case will be rescheduled. Blood thinners: Make sure to stop your blood thinners as instructed.  If you take a blood thinner, but were not instructed to stop it, call our office [phone] and ask to talk to a nurse. Not stopping a blood thinner prior to certain procedures could lead to serious complications. Diabetics on insulin : Notify the staff so that you can be scheduled 1st case in the morning. If your diabetes requires high dose insulin , take only  of your normal insulin  dose the morning of the procedure and notify the staff that you have done so. Preventing infections: Shower with an antibacterial soap the morning of your procedure.  Build-up your immune system: Take 1000 mg of Vitamin C with every meal (3 times a day) the day prior to your procedure. Antibiotics: Inform the nursing staff if you are taking any antibiotics or if you have any conditions that may require antibiotics prior to procedures. (Example: recent joint implants)   Pregnancy: If you are pregnant make sure to notify the nursing staff. Not doing so may result in injury to the fetus, including death.  Sickness: If you have a cold, fever, or any active infections, call and cancel or reschedule your procedure. Receiving steroids while having an infection may result in complications. Arrival: You must be in the facility at least 30 minutes prior to your scheduled procedure. Tardiness: Your scheduled time is also the cutoff time. If you do not arrive at least 15 minutes prior to your procedure, you will be rescheduled.  Children: Do not bring any children with  you. Make arrangements to keep them home. Dress appropriately: There is always a possibility that your clothing may get soiled. Avoid  long dresses. Valuables: Do not bring any jewelry or valuables.  Reasons to call and reschedule or cancel your procedure: (Following these recommendations will minimize the risk of a serious complication.) Surgeries: Avoid having procedures within 2 weeks of any surgery. (Avoid for 2 weeks before or after any surgery). Flu Shots: Avoid having procedures within 2 weeks of a flu shots or . (Avoid for 2 weeks before or after immunizations). Barium: Avoid having a procedure within 7-10 days after having had a radiological study involving the use of radiological contrast. (Myelograms, Barium swallow or enema study). Heart attacks: Avoid any elective procedures or surgeries for the initial 6 months after a Myocardial Infarction (Heart Attack). Blood thinners: It is imperative that you stop these medications before procedures. Let us  know if you if you take any blood thinner.  Infection: Avoid procedures during or within two weeks of an infection (including chest colds or gastrointestinal problems). Symptoms associated with infections include: Localized redness, fever, chills, night sweats or profuse sweating, burning sensation when voiding, cough, congestion, stuffiness, runny nose, sore throat, diarrhea, nausea, vomiting, cold or Flu symptoms, recent or current infections. It is specially important if the infection is over the area that we intend to treat. Heart and lung problems: Symptoms that may suggest an active cardiopulmonary problem include: cough, chest pain, breathing difficulties or shortness of breath, dizziness, ankle swelling, uncontrolled high or unusually low blood pressure, and/or palpitations. If you are experiencing any of these symptoms, cancel your procedure and contact your primary care physician for an evaluation.  Remember:  Regular Business hours are:  Monday to Thursday 8:00 AM to 4:00 PM  Provider's Schedule: Eric Como, MD:  Procedure days: Tuesday and Thursday 7:30  AM to 4:00 PM  Wallie Sherry, MD:  Procedure days: Monday and Wednesday 7:30 AM to 4:00 PM Last  Updated: 10/20/2023 ______________________________________________________________________     ______________________________________________________________________    General Risks and Possible Complications  Patient Responsibilities: It is important that you read this as it is part of your informed consent. It is our duty to inform you of the risks and possible complications associated with treatments offered to you. It is your responsibility as a patient to read this and to ask questions about anything that is not clear or that you believe was not covered in this document.  Patient's Rights: You have the right to refuse treatment. You also have the right to change your mind, even after initially having agreed to have the treatment done. However, under this last option, if you wait until the last second to change your mind, you may be charged for the materials used up to that point.  Introduction: Medicine is not an Visual merchandiser. Everything in Medicine, including the lack of treatment(s), carries the potential for danger, harm, or loss (which is by definition: Risk). In Medicine, a complication is a secondary problem, condition, or disease that can aggravate an already existing one. All treatments carry the risk of possible complications. The fact that a side effects or complications occurs, does not imply that the treatment was conducted incorrectly. It must be clearly understood that these can happen even when everything is done following the highest safety standards.  No treatment: You can choose not to proceed with the proposed treatment alternative. The "PRO(s)" would include: avoiding the risk of complications associated with the therapy. The "CON(s)" would include:  not getting any of the treatment benefits. These benefits fall under one of three categories: diagnostic; therapeutic; and/or  palliative. Diagnostic benefits include: getting information which can ultimately lead to improvement of the disease or symptom(s). Therapeutic benefits are those associated with the successful treatment of the disease. Finally, palliative benefits are those related to the decrease of the primary symptoms, without necessarily curing the condition (example: decreasing the pain from a flare-up of a chronic condition, such as incurable terminal cancer).  General Risks and Complications: These are associated to most interventional treatments. They can occur alone, or in combination. They fall under one of the following six (6) categories: no benefit or worsening of symptoms; bleeding; infection; nerve damage; allergic reactions; and/or death. No benefits or worsening of symptoms: In Medicine there are no guarantees, only probabilities. No healthcare provider can ever guarantee that a medical treatment will work, they can only state the probability that it may. Furthermore, there is always the possibility that the condition may worsen, either directly, or indirectly, as a consequence of the treatment. Bleeding: This is more common if the patient is taking a blood thinner, either prescription or over the counter (example: Goody Powders, Fish oil, Aspirin, Garlic, etc.), or if suffering a condition associated with impaired coagulation (example: Hemophilia, cirrhosis of the liver, low platelet counts, etc.). However, even if you do not have one on these, it can still happen. If you have any of these conditions, or take one of these drugs, make sure to notify your treating physician. Infection: This is more common in patients with a compromised immune system, either due to disease (example: diabetes, cancer, human immunodeficiency virus [HIV], etc.), or due to medications or treatments (example: therapies used to treat cancer and rheumatological diseases). However, even if you do not have one on these, it can still  happen. If you have any of these conditions, or take one of these drugs, make sure to notify your treating physician. Nerve Damage: This is more common when the treatment is an invasive one, but it can also happen with the use of medications, such as those used in the treatment of cancer. The damage can occur to small secondary nerves, or to large primary ones, such as those in the spinal cord and brain. This damage may be temporary or permanent and it may lead to impairments that can range from temporary numbness to permanent paralysis and/or brain death. Allergic Reactions: Any time a substance or material comes in contact with our body, there is the possibility of an allergic reaction. These can range from a mild skin rash (contact dermatitis) to a severe systemic reaction (anaphylactic reaction), which can result in death. Death: In general, any medical intervention can result in death, most of the time due to an unforeseen complication. ______________________________________________________________________      ______________________________________________________________________    Steroid injections  Common steroids for injections Triamcinolone: Used by many sports medicine physicians for large joint and bursal injections, often combined with a local anesthetic like lidocaine . A study focusing on coccydynia (tailbone pain) found triamcinolone was more effective than betamethasone , suggesting it may also be preferable for other localized inflammation conditions. Methylprednisolone: A common alternative to triamcinolone that is also a strong anti-inflammatory. It is available in different formulations, with the acetate suspension being the long-acting option for intra-articular injections. Dexamethasone : This is a non-particulate steroid, meaning it has a lower risk of tissue damage compared to particulate steroids like triamcinolone and methylprednisolone. While less common for this specific  use,  it is an option for targeted injections.   Considerations for physicians Particulate vs. non-particulate steroids: Triamcinolone and methylprednisolone are particulate, meaning they can clump together. Dexamethasone  is non-particulate. Particulate steroids are often preferred for their longer-lasting effects but carry a theoretical higher risk for certain injections (though this is less of a concern in the costochondral joints). Combined injectate: Corticosteroids are typically mixed with a local anesthetic like lidocaine  to provide both immediate pain relief (from the anesthetic) and longer-term inflammation reduction (from the steroid). Imaging guidance: To ensure accurate placement of the needle and medication, physicians may use ultrasound or fluoroscopic guidance for the injection, especially in complex or refractory cases.   Patient guidance Before undergoing a steroid injection, discuss the options with your physician. They will determine the best steroid, dosage, and procedure for your specific case based on factors like: Severity of your condition History of response to other treatments Your overall health status Experience and preference of the physician  Last  Updated: 07/05/2024 ______________________________________________________________________

## 2024-08-22 ENCOUNTER — Encounter: Payer: Self-pay | Admitting: Pain Medicine

## 2024-08-22 ENCOUNTER — Ambulatory Visit: Attending: Pain Medicine | Admitting: Pain Medicine

## 2024-08-22 VITALS — BP 119/80 | HR 108 | Temp 96.6°F | Resp 16 | Ht 65.0 in | Wt 110.0 lb

## 2024-08-22 DIAGNOSIS — R937 Abnormal findings on diagnostic imaging of other parts of musculoskeletal system: Secondary | ICD-10-CM | POA: Insufficient documentation

## 2024-08-22 DIAGNOSIS — M5134 Other intervertebral disc degeneration, thoracic region: Secondary | ICD-10-CM | POA: Diagnosis present

## 2024-08-22 DIAGNOSIS — M4312 Spondylolisthesis, cervical region: Secondary | ICD-10-CM | POA: Diagnosis present

## 2024-08-22 DIAGNOSIS — M47812 Spondylosis without myelopathy or radiculopathy, cervical region: Secondary | ICD-10-CM | POA: Insufficient documentation

## 2024-08-22 DIAGNOSIS — M5030 Other cervical disc degeneration, unspecified cervical region: Secondary | ICD-10-CM | POA: Diagnosis present

## 2024-08-22 DIAGNOSIS — M4854XS Collapsed vertebra, not elsewhere classified, thoracic region, sequela of fracture: Secondary | ICD-10-CM | POA: Insufficient documentation

## 2024-08-22 DIAGNOSIS — M792 Neuralgia and neuritis, unspecified: Secondary | ICD-10-CM | POA: Diagnosis present

## 2024-08-22 DIAGNOSIS — M4314 Spondylolisthesis, thoracic region: Secondary | ICD-10-CM | POA: Diagnosis present

## 2024-08-22 DIAGNOSIS — S22020D Wedge compression fracture of second thoracic vertebra, subsequent encounter for fracture with routine healing: Secondary | ICD-10-CM | POA: Insufficient documentation

## 2024-08-22 DIAGNOSIS — G4486 Cervicogenic headache: Secondary | ICD-10-CM | POA: Diagnosis not present

## 2024-08-22 DIAGNOSIS — M5489 Other dorsalgia: Secondary | ICD-10-CM | POA: Diagnosis present

## 2024-08-22 DIAGNOSIS — M546 Pain in thoracic spine: Secondary | ICD-10-CM | POA: Diagnosis present

## 2024-08-22 DIAGNOSIS — M542 Cervicalgia: Secondary | ICD-10-CM | POA: Insufficient documentation

## 2024-08-22 MED ORDER — GABAPENTIN 300 MG PO CAPS: 300.0000 mg | ORAL_CAPSULE | Freq: Three times a day (TID) | ORAL | 2 refills | Status: AC

## 2024-08-22 MED ORDER — METHOCARBAMOL 1000 MG/10ML IJ SOLN
200.0000 mg | Freq: Once | INTRAMUSCULAR | Status: AC
  Administered 2024-08-22: 200 mg via INTRAMUSCULAR
  Filled 2024-08-22: qty 10

## 2024-08-22 MED ORDER — KETOROLAC TROMETHAMINE 60 MG/2ML IM SOLN
60.0000 mg | Freq: Once | INTRAMUSCULAR | Status: AC
  Administered 2024-08-22: 60 mg via INTRAMUSCULAR
  Filled 2024-08-22: qty 2

## 2024-08-22 MED ORDER — PREDNISONE 20 MG PO TABS: ORAL_TABLET | ORAL | 0 refills | Status: AC

## 2024-08-22 NOTE — Progress Notes (Signed)
 Safety precautions to be maintained throughout the outpatient stay will include: orient to surroundings, keep bed in low position, maintain call bell within reach at all times, provide assistance with transfer out of bed and ambulation.

## 2024-08-23 ENCOUNTER — Ambulatory Visit
Admission: RE | Admit: 2024-08-23 | Discharge: 2024-08-23 | Disposition: A | Discharge status: Home / Self Care | Source: Ambulatory Visit | Attending: Pain Medicine | Admitting: Pain Medicine

## 2024-08-23 ENCOUNTER — Encounter: Payer: Self-pay | Admitting: Pain Medicine

## 2024-08-23 ENCOUNTER — Ambulatory Visit (HOSPITAL_BASED_OUTPATIENT_CLINIC_OR_DEPARTMENT_OTHER): Admitting: Pain Medicine

## 2024-08-23 VITALS — BP 122/78 | HR 101 | Temp 97.2°F | Resp 14 | Ht 65.0 in | Wt 120.0 lb

## 2024-08-23 DIAGNOSIS — R52 Pain, unspecified: Secondary | ICD-10-CM

## 2024-08-23 DIAGNOSIS — M546 Pain in thoracic spine: Secondary | ICD-10-CM | POA: Diagnosis present

## 2024-08-23 DIAGNOSIS — I6501 Occlusion and stenosis of right vertebral artery: Secondary | ICD-10-CM | POA: Diagnosis not present

## 2024-08-23 DIAGNOSIS — G8929 Other chronic pain: Secondary | ICD-10-CM | POA: Diagnosis not present

## 2024-08-23 DIAGNOSIS — M4312 Spondylolisthesis, cervical region: Secondary | ICD-10-CM | POA: Insufficient documentation

## 2024-08-23 DIAGNOSIS — M4854XS Collapsed vertebra, not elsewhere classified, thoracic region, sequela of fracture: Secondary | ICD-10-CM | POA: Diagnosis not present

## 2024-08-23 DIAGNOSIS — M5030 Other cervical disc degeneration, unspecified cervical region: Secondary | ICD-10-CM | POA: Insufficient documentation

## 2024-08-23 DIAGNOSIS — M5134 Other intervertebral disc degeneration, thoracic region: Secondary | ICD-10-CM | POA: Diagnosis not present

## 2024-08-23 DIAGNOSIS — Z9181 History of falling: Secondary | ICD-10-CM | POA: Diagnosis not present

## 2024-08-23 DIAGNOSIS — M542 Cervicalgia: Secondary | ICD-10-CM | POA: Diagnosis not present

## 2024-08-23 DIAGNOSIS — M5412 Radiculopathy, cervical region: Secondary | ICD-10-CM

## 2024-08-23 DIAGNOSIS — G4486 Cervicogenic headache: Secondary | ICD-10-CM | POA: Insufficient documentation

## 2024-08-23 DIAGNOSIS — Z91040 Latex allergy status: Secondary | ICD-10-CM

## 2024-08-23 DIAGNOSIS — R937 Abnormal findings on diagnostic imaging of other parts of musculoskeletal system: Secondary | ICD-10-CM | POA: Diagnosis not present

## 2024-08-23 DIAGNOSIS — M792 Neuralgia and neuritis, unspecified: Secondary | ICD-10-CM

## 2024-08-23 DIAGNOSIS — S22000S Wedge compression fracture of unspecified thoracic vertebra, sequela: Secondary | ICD-10-CM

## 2024-08-23 MED ORDER — ROPIVACAINE HCL 2 MG/ML IJ SOLN
1.0000 mL | Freq: Once | INTRAMUSCULAR | Status: AC
  Administered 2024-08-23: 1 mL via EPIDURAL

## 2024-08-23 MED ORDER — MIDAZOLAM HCL 5 MG/5ML IJ SOLN
INTRAMUSCULAR | Status: AC
  Filled 2024-08-23: qty 5

## 2024-08-23 MED ORDER — LIDOCAINE HCL (PF) 2 % IJ SOLN
INTRAMUSCULAR | Status: AC
  Filled 2024-08-23: qty 10

## 2024-08-23 MED ORDER — FENTANYL CITRATE (PF) 100 MCG/2ML IJ SOLN
25.0000 ug | INTRAMUSCULAR | Status: DC | PRN
  Administered 2024-08-23: 50 ug via INTRAVENOUS

## 2024-08-23 MED ORDER — SODIUM CHLORIDE (PF) 0.9 % IJ SOLN
INTRAMUSCULAR | Status: AC
  Filled 2024-08-23: qty 10

## 2024-08-23 MED ORDER — FENTANYL CITRATE (PF) 100 MCG/2ML IJ SOLN
INTRAMUSCULAR | Status: AC
  Filled 2024-08-23: qty 2

## 2024-08-23 MED ORDER — PENTAFLUOROPROP-TETRAFLUOROETH EX AERO
INHALATION_SPRAY | Freq: Once | CUTANEOUS | Status: AC
  Administered 2024-08-23: 30 via TOPICAL

## 2024-08-23 MED ORDER — MIDAZOLAM HCL 5 MG/5ML IJ SOLN
0.5000 mg | Freq: Once | INTRAMUSCULAR | Status: AC
  Administered 2024-08-23: 1 mg via INTRAVENOUS

## 2024-08-23 MED ORDER — LIDOCAINE HCL 2 % IJ SOLN
20.0000 mL | Freq: Once | INTRAMUSCULAR | Status: AC
  Administered 2024-08-23: 100 mg

## 2024-08-23 MED ORDER — DEXAMETHASONE SOD PHOSPHATE PF 10 MG/ML IJ SOLN
10.0000 mg | Freq: Once | INTRAMUSCULAR | Status: AC
  Administered 2024-08-23: 10 mg

## 2024-08-23 MED ORDER — IOHEXOL 180 MG/ML  SOLN
10.0000 mL | Freq: Once | INTRAMUSCULAR | Status: AC
  Administered 2024-08-23: 10 mL via EPIDURAL

## 2024-08-23 MED ORDER — SODIUM CHLORIDE 0.9% FLUSH
1.0000 mL | Freq: Once | INTRAVENOUS | Status: AC
  Administered 2024-08-23: 1 mL

## 2024-08-23 MED ORDER — IOHEXOL 180 MG/ML  SOLN
INTRAMUSCULAR | Status: AC
  Filled 2024-08-23: qty 10

## 2024-08-23 MED ORDER — ROPIVACAINE HCL 2 MG/ML IJ SOLN
INTRAMUSCULAR | Status: AC
  Filled 2024-08-23: qty 20

## 2024-08-23 NOTE — Progress Notes (Signed)
 Safety precautions to be maintained throughout the outpatient stay will include: orient to surroundings, keep bed in low position, maintain call bell within reach at all times, provide assistance with transfer out of bed and ambulation.

## 2024-08-23 NOTE — Progress Notes (Signed)
 PROVIDER NOTE: Interpretation of information contained herein should be left to medically-trained personnel. Specific patient instructions are provided elsewhere under Patient Instructions section of medical record. This document was created in part using STT-dictation technology, any transcriptional errors that may result from this process are unintentional.  Patient: Miranda Fleming Type: Established DOB: [birth year] MRN: [id number] PCP: System, Provider Not In  Service: Procedure DOS: 08/23/2024 Setting: Ambulatory Location: Ambulatory outpatient facility Delivery: Face-to-face Provider: Eric DELENA Como, MD Specialty: Interventional Pain Management Specialty designation: 09 Location: Outpatient facility Ref. Prov.: No ref. provider found       Interventional Therapy   Type: Cervical Epidural Steroid injection (CESI) (Interlaminar) #1  Laterality: Left (-LT)  Level: C7-T1 DOS: 08/23/2024  Provider: Eric DELENA Como, MD Imaging: Fluoroscopy-guided Spinal ([id number]) Anesthesia: Local anesthesia (1-2% Lidocaine ) Anxiolysis: IV Versed  1.0 mg Sedation: Minimal Sedation Fentanyl  1 mL (50 mcg)  Medical Necessity Purpose: Diagnostic/Therapeutic Rationale (medical necessity): procedure needed and proper for the diagnosis and/or treatment of [name]'s medical symptoms and needs. Indications: Cervicalgia, cervical radicular pain, degenerative disc disease, severe enough to impact quality of life or function. 1. Cervicalgia   2. Left cervical radiculopathy   3. Cervicogenic headache (Bilateral)   4. Chronic thoracic back pain (1ry area of Pain) (Midline)   5. Compression of thoracic vertebra, sequela (T1, T2, T3, T4)   6. DDD (degenerative disc disease), cervical   7. DDD (degenerative disc disease), thoracic   8. Grade 1 Anterolisthesis of cervical spine (C7-T1)   9. Neurogenic pain (cervical) (Bilateral) (L>R)   10. Non-traumatic compression fracture of T1 thoracic  vertebra, sequela   11. Right cervical radiculopathy   12. Stenosis of right vertebral artery   13. Burning pain (cervical) (Left)   14. Abnormal MRI, cervical spine (02/16/2024 & 03/23/2024)    NAS-11 Pain score:   Pre-procedure: 10-Worst pain ever/10   Post-procedure: 1 /10   Note: The patient describes having Dizzy Spells and falls as a consequence of these. She has a history of vertebral artery stenosis and she may be experiencing TIAs. She has been instructed to follow-up with her PCP, ASAP to have this looked into further. She has assured us  that she will, ASAP.    Position  Prep  Materials:  Location setting: Procedure suite Position: Prone, on modified reverse trendelenburg to facilitate breathing, with head in head-cradle. Pillows positioned under chest (below chin-level) with cervical spine flexed. Safety Precautions: Patient was assessed for positional comfort and pressure points before starting the procedure. Prepping solution: DuraPrep (Iodine Povacrylex [0.7% available iodine] and Isopropyl Alcohol, 74% w/w) Prep Area: Entire  cervicothoracic region Approach: percutaneous, paramedial Intended target: Posterior cervical epidural space Materials Procedure:  Tray: Epidural Needle(s): Epidural (Tuohy) Qty: 1 Length: (90mm) 3.5-inch Gauge: 17G  H&P (Pre-op Assessment):  Miranda Fleming is a 58 y.o. (year old), female patient, seen today for interventional treatment. She  has a past surgical history that includes EXPLORATORY LAPAROTOMY W/  LEFT SALPINGOOPHORECTOMY (01-25-2001); Abdominal hysterectomy (1995); LAPAROSCOPY'S AND LAPAROTOMY'S FOR PERSISTANT OVARIAN CYST (X 5  1987;  1989;  1990;  1993;  1994); and Pubovaginal sling (N/A, 10/31/2013). Miranda Fleming has a current medication list which includes the following prescription(s): alprazolam , gabapentin , and prednisone, and the following Facility-Administered Medications: fentanyl . Her primarily concern today is the Neck  Pain  Initial Vital Signs:  Pulse/HCG Rate: (!) 101ECG Heart Rate: 89 (nsr) Temp: (!) 97.3 F (36.3 C) Resp: 18 BP: 124/77 SpO2: 97 %  BMI: Estimated body mass index is 19.97  kg/m as calculated from the following:   Height as of this encounter: 5' 5 (1.651 m).   Weight as of this encounter: 120 lb (54.4 kg).  Risk Assessment: Allergies: Reviewed. She is allergic to amoxicillin and latex.  Allergy Precautions: None required Coagulopathies: Reviewed. None identified.  Blood-thinner therapy: None at this time Active Infection(s): Reviewed. None identified. Miranda Fleming is afebrile  Site Confirmation: Miranda Fleming was asked to confirm the procedure and laterality before marking the site Procedure checklist: Completed Consent: Before the procedure and under the influence of no sedative(s), amnesic(s), or anxiolytics, the patient was informed of the treatment options, risks and possible complications. To fulfill our ethical and legal obligations, as recommended by the American Medical Association's Code of Ethics, I have informed the patient of my clinical impression; the nature and purpose of the treatment or procedure; the risks, benefits, and possible complications of the intervention; the alternatives, including doing nothing; the risk(s) and benefit(s) of the alternative treatment(s) or procedure(s); and the risk(s) and benefit(s) of doing nothing. The patient was provided information about the general risks and possible complications associated with the procedure. These may include, but are not limited to: failure to achieve desired goals, infection, bleeding, organ or nerve damage, allergic reactions, paralysis, and death. In addition, the patient was informed of those risks and complications associated to Spine-related procedures, such as failure to decrease pain; infection (i.e.: Meningitis, epidural or intraspinal abscess); bleeding (i.e.: epidural hematoma, subarachnoid hemorrhage, or  any other type of intraspinal or peri-dural bleeding); organ or nerve damage (i.e.: Any type of peripheral nerve, nerve root, or spinal cord injury) with subsequent damage to sensory, motor, and/or autonomic systems, resulting in permanent pain, numbness, and/or weakness of one or several areas of the body; allergic reactions; (i.e.: anaphylactic reaction); and/or death. Furthermore, the patient was informed of those risks and complications associated with the medications. These include, but are not limited to: allergic reactions (i.e.: anaphylactic or anaphylactoid reaction(s)); adrenal axis suppression; blood sugar elevation that in diabetics may result in ketoacidosis or comma; water  retention that in patients with history of congestive heart failure may result in shortness of breath, pulmonary edema, and decompensation with resultant heart failure; weight gain; swelling or edema; medication-induced neural toxicity; particulate matter embolism and blood vessel occlusion with resultant organ, and/or nervous system infarction; and/or aseptic necrosis of one or more joints. Finally, the patient was informed that Medicine is not an exact science; therefore, there is also the possibility of unforeseen or unpredictable risks and/or possible complications that may result in a catastrophic outcome. The patient indicated having understood very clearly. We have given the patient no guarantees and we have made no promises. Enough time was given to the patient to ask questions, all of which were answered to the patient's satisfaction. Miranda Fleming has indicated that she wanted to continue with the procedure. Attestation: I, the ordering provider, attest that I have discussed with the patient the benefits, risks, side-effects, alternatives, likelihood of achieving goals, and potential problems during recovery for the procedure that I have provided informed consent. Date  Time: 08/23/2024  8:40 AM  Pre-Procedure  Preparation:  Monitoring: As per clinic protocol. Respiration, ETCO2, SpO2, BP, heart rate and rhythm monitor placed and checked for adequate function Safety Precautions: Patient was assessed for positional comfort and pressure points before starting the procedure. Time-out: I initiated and conducted the Time-out before starting the procedure, as per protocol. The patient was asked to participate by confirming the accuracy of the  Time Out information. Verification of the correct person, site, and procedure were performed and confirmed by me, the nursing staff, and the patient. Time-out conducted as per Joint Commission's Universal Protocol (UP.01.01.01). Time: 0930 Start Time: 0930 hrs.  Description  Narrative of Procedure:          Rationale (medical necessity): procedure needed and proper for the diagnosis and/or treatment of the patient's medical symptoms and needs. Start Time: 0930 hrs. Safety Precautions: Aspiration looking for blood return was conducted prior to all injections. At no point did we inject any substances, as a needle was being advanced. No attempts were made at seeking any paresthesias. Safe injection practices and needle disposal techniques used. Medications properly checked for expiration dates. SDV (single dose vial) medications used. Description of procedure: Protocol guidelines were followed. The patient was assisted into a comfortable position. The target area was identified and the area prepped in the usual manner. Skin & deeper tissues infiltrated with local anesthetic. Appropriate amount of time allowed to pass for local anesthetics to take effect. Using fluoroscopic guidance, the epidural needle was introduced through the skin, ipsilateral to the reported pain, and advanced to the target area. Posterior laminar os was contacted and the needle walked caudad, until the lamina was cleared. The ligamentum flavum was engaged and the epidural space identified using  "loss-of-resistance technique" with 2-3 ml of PF-NaCl (0.9% NSS), in a 5cc dedicated LOR syringe. (See Imaging guidance below for use of contrast details.) Once proper needle placement was secured, and negative aspiration confirmed, the solution was injected in intermittent fashion, asking for systemic symptoms every 0.5cc. The needles were then removed and the area cleansed, making sure to leave some of the prepping solution back to take advantage of its long term bactericidal properties.  Vitals:   08/23/24 0944 08/23/24 0951 08/23/24 1000 08/23/24 1014  BP: 117/71 125/77 134/83 122/78  Pulse:      Resp: 17 17 20 14   Temp:  97.9 F (36.6 C)  (!) 97.2 F (36.2 C)  TempSrc:  Temporal  Temporal  SpO2: 100% 100% 99% 99%  Weight:      Height:         End Time: 0944 hrs.  Imaging Guidance (Spinal):          Type of Imaging Technique: Fluoroscopy Guidance (Spinal) Indication(s): Fluoroscopy guidance for needle placement to enhance accuracy in procedures requiring precise needle localization for targeted delivery of medication in or near specific anatomical locations not easily accessible without such real-time imaging assistance. Exposure Time: Please see nurses notes. Contrast: Before injecting any contrast, we confirmed that the patient did not have an allergy to iodine, shellfish, or radiological contrast. Once satisfactory needle placement was completed at the desired level, radiological contrast was injected. Contrast injected under live fluoroscopy. No contrast complications. See chart for type and volume of contrast used. Fluoroscopic Guidance: I was personally present during the use of fluoroscopy. Tunnel Vision Technique used to obtain the best possible view of the target area. Parallax error corrected before commencing the procedure. Direction-depth-direction technique used to introduce the needle under continuous pulsed fluoroscopy. Once target was reached, antero-posterior,  oblique, and lateral fluoroscopic projection used confirm needle placement in all planes. Images permanently stored in EMR. Interpretation: I personally interpreted the imaging intraoperatively. Adequate needle placement confirmed in multiple planes. Appropriate spread of contrast into desired area was observed. No evidence of afferent or efferent intravascular uptake. No intrathecal or subarachnoid spread observed. Permanent images saved into the patient's  record.  Post-operative Assessment:  Post-procedure Vital Signs:  Pulse/HCG Rate: (!) 10187 Temp: (!) 97.2 F (36.2 C) Resp: 14 BP: 122/78 SpO2: 99 %  EBL: None  Complications: No immediate post-treatment complications observed by team, or reported by patient.  Note: The patient tolerated the entire procedure well. A repeat set of vitals were taken after the procedure and the patient was kept under observation following institutional policy, for this type of procedure. Post-procedural neurological assessment was performed, showing return to baseline, prior to discharge. The patient was provided with post-procedure discharge instructions, including a section on how to identify potential problems. Should any problems arise concerning this procedure, the patient was given instructions to immediately contact us , at any time, without hesitation. In any case, we plan to contact the patient by telephone for a follow-up status report regarding this interventional procedure.  Comments:  No additional relevant information.  Plan of Care (POC)  Orders:  Orders Placed This Encounter  Procedures   Cervical Epidural Injection    Indication(s): Radiculitis and cervicalgia associated with cervical degenerative disc disease. Position: Prone Imaging guidance: Fluoroscopy required. Contrast required unless contraindicated by allergy or severe CKD. Equipment & Materials: Epidural tray & needle.    Scheduling Instructions:     Procedure: Cervical  Epidural Steroid Injection/Block     Planned Level(s): C7-T1     Laterality: Left-sided     Anxiolysis: Patient's choice.     Date: 08/23/2024    Where will this procedure be performed?:   [facility]             by Dr. Tanya   Cervical Epidural Injection    Sedation: Patient's choice. Purpose: Therapeutic Indication(s): Radiculitis and cervicalgia associater with cervical degenerative disc disease.    Standing Status:   Future    Expected Date:   09/06/2024    Expiration Date:   08/23/2025    Scheduling Instructions:     Procedure: Cervical Epidural Steroid Injection/Block     Level(s): C7-T1     Laterality: TBD     Timeframe: 2 weeks from now.    Where will this procedure be performed?:   [facility]             by Dr. Tanya [facility] 1-60 MIN NO REPORT    Intraoperative interpretation by procedural physician at [facility].    Standing Status:   Standing    Number of Occurrences:   1    Reason for exam::   Assistance in needle guidance and placement for procedures requiring needle placement in or near specific anatomical locations not easily accessible without such assistance.   Informed Consent Details: Physician/Practitioner Attestation; Transcribe to consent form and obtain patient signature    Nursing instructions: Transcribe to consent form and obtain patient signature. Always confirm laterality of pain with Miranda Fleming, before procedure.    Physician/Practitioner attestation of informed consent for procedure/surgical case:   I, the physician/practitioner, attest that I have discussed with the patient the benefits, risks, side effects, alternatives, likelihood of achieving goals and potential problems during recovery for the procedure that I have provided informed consent.    Procedure:   Cervical Epidural Steroid Injection (CESI) under fluoroscopic guidance    Physician/Practitioner performing the procedure:   Ariyona Eid A. Tanya  MD    Indication/Reason:   Indications: Cervicalgia (neck pain), cervical radicular pain, radiculitis (arm/shoulder pain, numbness, and/or weakness), degenerative disc disease, severe enough to greatly impact quality  of life or function.   Provide equipment / supplies at bedside    Procedural tray: Epidural Tray (Disposable  single use) Skin infiltration needle: Regular 1.5-in, 25-G, (x1) Block needle size: Regular standard Catheter: No catheter required    Standing Status:   Standing    Number of Occurrences:   1    Specify:   Epidural Tray   Saline lock IV    Have LR [phone] mL available and administer at 125 mL/hr if patient becomes hypotensive.    Standing Status:   Standing    Number of Occurrences:   1   Latex precautions    Activate Latex-Free Protocol.    Standing Status:   Standing    Number of Occurrences:   1     Analgesic: No chronic opioid analgesics therapy prescribed by our practice. Tramadol 50 mg tablet, 1 tab p.o. daily (# 30) (30/month) (last filled on 11/28/2023) MME/day: 10 mg/day    Medications ordered for procedure: Meds ordered this encounter  Medications   iohexol  (OMNIPAQUE ) 180 MG/ML injection 10 mL    Must be Myelogram-compatible. If not available, you may substitute with a water -soluble, non-ionic, hypoallergenic, myelogram-compatible radiological contrast medium.   lidocaine  (XYLOCAINE ) 2 % (with pres) injection 400 mg   pentafluoroprop-tetrafluoroeth (GEBAUERS) aerosol   midazolam  (VERSED ) 5 MG/5ML injection 0.5-2 mg    Make sure Flumazenil is available in the pyxis when using this medication. If oversedation occurs, administer 0.2 mg IV over 15 sec. If after 45 sec no response, administer 0.2 mg again over 1 min; may repeat at 1 min intervals; not to exceed 4 doses (1 mg)   fentaNYL  (SUBLIMAZE ) injection 25-50 mcg    Make sure Narcan is available in the pyxis when using this medication. In the event of respiratory depression (RR< 8/min): Titrate  NARCAN (naloxone) in increments of 0.1 to 0.2 mg IV at 2-3 minute intervals, until desired degree of reversal.   sodium chloride  flush (NS) 0.9 % injection 1 mL   ropivacaine  (PF) 2 mg/mL (0.2%) (NAROPIN ) injection 1 mL   dexamethasone  (DECADRON ) injection 10 mg   Medications administered: We administered iohexol , lidocaine , pentafluoroprop-tetrafluoroeth, midazolam , fentaNYL , sodium chloride  flush, ropivacaine  (PF) 2 mg/mL (0.2%), and dexamethasone .  See the medical record for exact dosing, route, and time of administration.    Interventional Therapies  Risk Factors  Considerations  Medical Comorbidities:  Allergy: LATEX  Hx. DVT     Planned  Pending:    Diagnostic left cervical ESI #1    Under consideration:   Diagnostic left cervical ESI #1    Completed:   Diagnostic/therapeutic bilateral cervical facet (TON-C6) MBB x1 (05/24/2024) (100/100/0/0)  Diagnostic/therapeutic midline (T2-3) thoracic ESI x1 (12/24/2023) (100/100/5/RUE:100LUE:90UB:5-10)  Therapeutic left (T2-3) thoracic ESI x1 (03/10/2024) (100/100/90x36hrs/45)  Therapeutic left (T3-4) thoracic ESI x1 (04/07/2024) (DNKFU-Returned 05/11/2024) (0/0/0/0)    Therapeutic  Palliative (PRN) options:   None established   Completed by other providers:   None reported      Follow-up plan:   Return in about 2 weeks (around 09/06/2024) for (ECT): (L) CESI #2, (PPE).     Recent Visits Date Type Provider Dept  08/22/24 Office Visit Tanya Glisson, MD [facility]  06/07/24 Office Visit Tanya Glisson, MD [facility]  Showing recent visits within past 90 days and meeting all other requirements Today's Visits Date Type Provider Dept  08/23/24 Procedure visit Tanya Glisson, MD [facility]  Showing today's visits and meeting all other requirements Future Appointments Date  Type Provider Dept  09/13/24 Appointment Tanya Glisson, MD [facility]  Showing future  appointments within next 90 days and meeting all other requirements   Disposition: Discharge home  Discharge (Date  Time): 08/23/2024; 1015 hrs.   Primary Care Physician: System, Provider Not In Location: [facility] Note by: Glisson DELENA Tanya, MD (TTS technology used. I apologize for any typographical errors that were not detected and corrected.) Date: 08/23/2024; Time: 10:21 AM  Disclaimer:  Medicine is not an Visual merchandiser. The only guarantee in medicine is that nothing is guaranteed. It is important to note that the decision to proceed with this intervention was based on the information collected from the patient. The Data and conclusions were drawn from the patient's questionnaire, the interview, and the physical examination. Because the information was provided in large part by the patient, it cannot be guaranteed that it has not been purposely or unconsciously manipulated. Every effort has been made to obtain as much relevant data as possible for this evaluation. It is important to note that the conclusions that lead to this procedure are derived in large part from the available data. Always take into account that the treatment will also be dependent on availability of resources and existing treatment guidelines, considered by other Pain Management Practitioners as being common knowledge and practice, at the time of the intervention. For Medico-Legal purposes, it is also important to point out that variation in procedural techniques and pharmacological choices are the acceptable norm. The indications, contraindications, technique, and results of the above procedure should only be interpreted and judged by a Board-Certified Interventional Pain Specialist with extensive familiarity and expertise in the same exact procedure and technique.

## 2024-08-23 NOTE — Patient Instructions (Addendum)
 ______________________________________________________________________    Post-Procedure Discharge Instructions  INSTRUCTIONS Apply ice:  Purpose: This will minimize any swelling and discomfort after procedure.  When: Day of procedure, as soon as you get home. How: Fill a plastic sandwich bag with crushed ice. Cover it with a small towel and apply to injection site. How long: (15 min on, 15 min off) Apply for 15 minutes then remove x 15 minutes.  Repeat sequence on day of procedure, until you go to bed. Apply heat:  Purpose: To treat any soreness and discomfort from the procedure. When: Starting the next day after the procedure. How: Apply heat to procedure site starting the day following the procedure. How long: May continue to repeat daily, until discomfort goes away. Food intake: Start with clear liquids (like water ) and advance to regular food, as tolerated.  Physical activities: Keep activities to a minimum for the first 8 hours after the procedure. After that, then as tolerated. Driving: If you have received any sedation, be responsible and do not drive. You are not allowed to drive for 24 hours after having sedation. Blood thinner: (Applies only to those taking blood thinners) You may restart your blood thinner 6 hours after your procedure. Insulin: (Applies only to Diabetic patients taking insulin) As soon as you can eat, you may resume your normal dosing schedule. Infection prevention: Keep procedure site clean and dry. Shower daily and clean area with soap and water .  PAIN DIARY Post-procedure Pain Diary: Extremely important that this be done correctly and accurately. Recorded information will be used to determine the next step in treatment. For the purpose of accuracy, follow these rules: Evaluate only the area treated. Do not report or include pain from an untreated area. For the purpose of this evaluation, ignore all other areas of pain, except for the treated area. After your  procedure, avoid taking a long nap and attempting to complete the pain diary after you wake up. Instead, set your alarm clock to go off every hour, on the hour, for the initial 8 hours after the procedure. Document the duration of the numbing medicine, and the relief you are getting from it. Do not go to sleep and attempt to complete it later. It will not be accurate. If you received sedation, it is likely that you were given a medication that may cause amnesia. Because of this, completing the diary at a later time may cause the information to be inaccurate. This information is needed to plan your care. Follow-up appointment: Keep your post-procedure follow-up evaluation appointment after the procedure (usually 2 weeks for most procedures, 6 weeks for radiofrequencies). DO NOT FORGET to bring you pain diary with you.   EXPECT... (What should I expect to see with my procedure?) From numbing medicine (AKA: Local Anesthetics): Numbness or decrease in pain. You may also experience some weakness, which if present, could last for the duration of the local anesthetic. Onset: Full effect within 15 minutes of injected. Duration: It will depend on the type of local anesthetic used. On the average, 1 to 8 hours.  From steroids (Applies only if steroids were used): Decrease in swelling or inflammation. Once inflammation is improved, relief of the pain will follow. Onset of benefits: Depends on the amount of swelling present. The more swelling, the longer it will take for the benefits to be seen. In some cases, up to 10 days. Duration: Steroids will stay in the system x 2 weeks. Duration of benefits will depend on multiple posibilities including persistent irritating  factors. Side-effects: If present, they may typically last 2 weeks (the duration of the steroids). Frequent: Cramps (if they occur, drink Gatorade and take over-the-counter Magnesium 450-500 mg once to twice a day); water  retention with temporary weight  gain; increases in blood sugar; decreased immune system response; increased appetite. Occasional: Facial flushing (red, warm cheeks); mood swings; menstrual changes. Uncommon: Long-term decrease or suppression of natural hormones; bone thinning. (These are more common with higher doses or more frequent use. This is why we prefer that our patients avoid having any injection therapies in other practices.)  Very Rare: Severe mood changes; psychosis; aseptic necrosis. From procedure: Some discomfort is to be expected once the numbing medicine wears off. This should be minimal if ice and heat are applied as instructed.  CALL IF... (When should I call?) You experience numbness and weakness that gets worse with time, as opposed to wearing off. New onset bowel or bladder incontinence. (Applies only to procedures done in the spine)  Emergency Numbers: Durning business hours (Monday - Thursday, 8:00 AM - 4:00 PM) (Friday, 9:00 AM - 12:00 Noon): [phone] After hours: [phone] NOTE: If you are having a problem and are unable connect with, or to talk to a provider, then go to your nearest urgent care or emergency department. If the problem is serious and urgent, please call 911. ______________________________________________________________________     ______________________________________________________________________    Steroid injections  Common steroids for injections Triamcinolone: Used by many sports medicine physicians for large joint and bursal injections, often combined with a local anesthetic like lidocaine . A study focusing on coccydynia (tailbone pain) found triamcinolone was more effective than betamethasone, suggesting it may also be preferable for other localized inflammation conditions. Methylprednisolone: A common alternative to triamcinolone that is also a strong anti-inflammatory. It is available in different formulations, with the acetate suspension being the long-acting  option for intra-articular injections. Dexamethasone : This is a non-particulate steroid, meaning it has a lower risk of tissue damage compared to particulate steroids like triamcinolone and methylprednisolone. While less common for this specific use, it is an option for targeted injections.   Considerations for physicians Particulate vs. non-particulate steroids: Triamcinolone and methylprednisolone are particulate, meaning they can clump together. Dexamethasone  is non-particulate. Particulate steroids are often preferred for their longer-lasting effects but carry a theoretical higher risk for certain injections (though this is less of a concern in the costochondral joints). Combined injectate: Corticosteroids are typically mixed with a local anesthetic like lidocaine  to provide both immediate pain relief (from the anesthetic) and longer-term inflammation reduction (from the steroid). Imaging guidance: To ensure accurate placement of the needle and medication, physicians may use ultrasound or fluoroscopic guidance for the injection, especially in complex or refractory cases.   Patient guidance Before undergoing a steroid injection, discuss the options with your physician. They will determine the best steroid, dosage, and procedure for your specific case based on factors like: Severity of your condition History of response to other treatments Your overall health status Experience and preference of the physician  Last  Updated: 07/05/2024 ______________________________________________________________________    ______________________________________________________________________    Procedure instructions  Stop blood-thinners  Do not eat or drink fluids (other than water ) for 6 hours before your procedure  No water  for 2 hours before your procedure  Take your blood pressure medicine with a sip of water   Arrive 30 minutes before your appointment  If sedation is planned, bring suitable  driver. [place], [place], & public transportation are NOT APPROVED)  Carefully read  the Preparing for your procedure detailed instructions  If you have questions call us  at [phone]  Procedure appointments are for procedures only.   NO medication refills or new problem evaluations will be done on procedure days.   Only the scheduled, pre-approved procedure and side will be done.   ______________________________________________________________________     ______________________________________________________________________    Preparing for your procedure  Appointments: If you think you may not be able to keep your appointment, call 24-48 hours in advance to cancel. We need time to make it available to others.  Procedure visits are for procedures only. During your procedure appointment there will be: NO Prescription Refills*. NO medication changes or discussions*. NO discussion of disability issues*. NO unrelated pain problem evaluations*. NO evaluations to order other pain procedures*. *These will be addressed at a separate and distinct evaluation encounter on the provider's evaluation schedule and not during procedure days.  Instructions: Food intake: Avoid eating anything solid for at least 8 hours prior to your procedure. Clear liquid intake: You may take clear liquids such as water  up to 2 hours prior to your procedure. (No carbonated drinks. No soda.) Transportation: Unless otherwise stated by your physician, bring a driver. (Driver cannot be a Market researcher, Pharmacist, community, or any other form of public transportation.) Morning Medicines: Except for blood thinners, take all of your other morning medications with a sip of water . Make sure to take your heart and blood pressure medicines. If your blood pressure's lower number is above 100, the case will be rescheduled. Blood thinners: Make sure to stop your blood thinners as instructed.  If you take a blood thinner, but were not instructed to  stop it, call our office [phone] and ask to talk to a nurse. Not stopping a blood thinner prior to certain procedures could lead to serious complications. Diabetics on insulin: Notify the staff so that you can be scheduled 1st case in the morning. If your diabetes requires high dose insulin, take only  of your normal insulin dose the morning of the procedure and notify the staff that you have done so. Preventing infections: Shower with an antibacterial soap the morning of your procedure.  Build-up your immune system: Take 1000 mg of Vitamin C with every meal (3 times a day) the day prior to your procedure. Antibiotics: Inform the nursing staff if you are taking any antibiotics or if you have any conditions that may require antibiotics prior to procedures. (Example: recent joint implants)   Pregnancy: If you are pregnant make sure to notify the nursing staff. Not doing so may result in injury to the fetus, including death.  Sickness: If you have a cold, fever, or any active infections, call and cancel or reschedule your procedure. Receiving steroids while having an infection may result in complications. Arrival: You must be in the facility at least 30 minutes prior to your scheduled procedure. Tardiness: Your scheduled time is also the cutoff time. If you do not arrive at least 15 minutes prior to your procedure, you will be rescheduled.  Children: Do not bring any children with you. Make arrangements to keep them home. Dress appropriately: There is always a possibility that your clothing may get soiled. Avoid long dresses. Valuables: Do not bring any jewelry or valuables.  Reasons to call and reschedule or cancel your procedure: (Following these recommendations will minimize the risk of a serious complication.) Surgeries: Avoid having procedures within 2 weeks of any surgery. (Avoid for 2 weeks before or after any surgery).  Flu Shots: Avoid having procedures within 2 weeks of a flu shots or .  (Avoid for 2 weeks before or after immunizations). Barium: Avoid having a procedure within 7-10 days after having had a radiological study involving the use of radiological contrast. (Myelograms, Barium swallow or enema study). Heart attacks: Avoid any elective procedures or surgeries for the initial 6 months after a Myocardial Infarction (Heart Attack). Blood thinners: It is imperative that you stop these medications before procedures. Let us  know if you if you take any blood thinner.  Infection: Avoid procedures during or within two weeks of an infection (including chest colds or gastrointestinal problems). Symptoms associated with infections include: Localized redness, fever, chills, night sweats or profuse sweating, burning sensation when voiding, cough, congestion, stuffiness, runny nose, sore throat, diarrhea, nausea, vomiting, cold or Flu symptoms, recent or current infections. It is specially important if the infection is over the area that we intend to treat. Heart and lung problems: Symptoms that may suggest an active cardiopulmonary problem include: cough, chest pain, breathing difficulties or shortness of breath, dizziness, ankle swelling, uncontrolled high or unusually low blood pressure, and/or palpitations. If you are experiencing any of these symptoms, cancel your procedure and contact your primary care physician for an evaluation.  Remember:  Regular Business hours are:  Monday to Thursday 8:00 AM to 4:00 PM  Provider's Schedule: Eric Como, MD:  Procedure days: Tuesday and Thursday 7:30 AM to 4:00 PM  Wallie Sherry, MD:  Procedure days: Monday and Wednesday 7:30 AM to 4:00 PM Last  Updated: 10/20/2023 ______________________________________________________________________     ______________________________________________________________________    General Risks and Possible Complications  Patient Responsibilities: It is important that you read this as it is part of  your informed consent. It is our duty to inform you of the risks and possible complications associated with treatments offered to you. It is your responsibility as a patient to read this and to ask questions about anything that is not clear or that you believe was not covered in this document.  Patient's Rights: You have the right to refuse treatment. You also have the right to change your mind, even after initially having agreed to have the treatment done. However, under this last option, if you wait until the last second to change your mind, you may be charged for the materials used up to that point.  Introduction: Medicine is not an Visual merchandiser. Everything in Medicine, including the lack of treatment(s), carries the potential for danger, harm, or loss (which is by definition: Risk). In Medicine, a complication is a secondary problem, condition, or disease that can aggravate an already existing one. All treatments carry the risk of possible complications. The fact that a side effects or complications occurs, does not imply that the treatment was conducted incorrectly. It must be clearly understood that these can happen even when everything is done following the highest safety standards.  No treatment: You can choose not to proceed with the proposed treatment alternative. The "PRO(s)" would include: avoiding the risk of complications associated with the therapy. The "CON(s)" would include: not getting any of the treatment benefits. These benefits fall under one of three categories: diagnostic; therapeutic; and/or palliative. Diagnostic benefits include: getting information which can ultimately lead to improvement of the disease or symptom(s). Therapeutic benefits are those associated with the successful treatment of the disease. Finally, palliative benefits are those related to the decrease of the primary symptoms, without necessarily curing the condition (example: decreasing the pain from a  flare-up of a  chronic condition, such as incurable terminal cancer).  General Risks and Complications: These are associated to most interventional treatments. They can occur alone, or in combination. They fall under one of the following six (6) categories: no benefit or worsening of symptoms; bleeding; infection; nerve damage; allergic reactions; and/or death. No benefits or worsening of symptoms: In Medicine there are no guarantees, only probabilities. No healthcare provider can ever guarantee that a medical treatment will work, they can only state the probability that it may. Furthermore, there is always the possibility that the condition may worsen, either directly, or indirectly, as a consequence of the treatment. Bleeding: This is more common if the patient is taking a blood thinner, either prescription or over the counter (example: Goody Powders, Fish oil, Aspirin, Garlic, etc.), or if suffering a condition associated with impaired coagulation (example: Hemophilia, cirrhosis of the liver, low platelet counts, etc.). However, even if you do not have one on these, it can still happen. If you have any of these conditions, or take one of these drugs, make sure to notify your treating physician. Infection: This is more common in patients with a compromised immune system, either due to disease (example: diabetes, cancer, human immunodeficiency virus [HIV], etc.), or due to medications or treatments (example: therapies used to treat cancer and rheumatological diseases). However, even if you do not have one on these, it can still happen. If you have any of these conditions, or take one of these drugs, make sure to notify your treating physician. Nerve Damage: This is more common when the treatment is an invasive one, but it can also happen with the use of medications, such as those used in the treatment of cancer. The damage can occur to small secondary nerves, or to large primary ones, such as those in the spinal cord and  brain. This damage may be temporary or permanent and it may lead to impairments that can range from temporary numbness to permanent paralysis and/or brain death. Allergic Reactions: Any time a substance or material comes in contact with our body, there is the possibility of an allergic reaction. These can range from a mild skin rash (contact dermatitis) to a severe systemic reaction (anaphylactic reaction), which can result in death. Death: In general, any medical intervention can result in death, most of the time due to an unforeseen complication. ______________________________________________________________________

## 2024-08-24 ENCOUNTER — Telehealth: Payer: Self-pay

## 2024-08-30 ENCOUNTER — Ambulatory Visit (INDEPENDENT_AMBULATORY_CARE_PROVIDER_SITE_OTHER): Admitting: Vascular Surgery

## 2024-08-30 ENCOUNTER — Encounter (INDEPENDENT_AMBULATORY_CARE_PROVIDER_SITE_OTHER): Payer: Self-pay | Admitting: Vascular Surgery

## 2024-08-30 VITALS — BP 106/72 | HR 103 | Resp 16 | Ht 65.5 in | Wt 112.0 lb

## 2024-08-30 DIAGNOSIS — Z86718 Personal history of other venous thrombosis and embolism: Secondary | ICD-10-CM

## 2024-08-30 DIAGNOSIS — G629 Polyneuropathy, unspecified: Secondary | ICD-10-CM

## 2024-08-30 DIAGNOSIS — I6501 Occlusion and stenosis of right vertebral artery: Secondary | ICD-10-CM

## 2024-08-30 NOTE — Assessment & Plan Note (Signed)
 No recent symptoms worrisome for DVT.  Had a negative study earlier this year.

## 2024-08-30 NOTE — Progress Notes (Signed)
 Patient ID: Miranda Fleming, female   DOB: Mar 24, 1966, 58 y.o.   MRN: [id number]  Chief Complaint  Patient presents with   New Patient (Initial Visit)    Ref Miranda Fleming right vertebral artery stenosis    HPI Miranda Fleming is a 58 y.o. female.  I am asked to see the patient by Miranda Fleming for evaluation of a diminutive right vertebral artery with diminished flow.  This was detected on imaging studies earlier this year.  I have independently reviewed her MR angiogram from approximately 6 months ago.  This demonstrates a large dominant left vertebral artery which is patent and continuous.  The right vertebral artery is diminutive which is likely chronic and possibly developmentally small.  This is not an important clinical finding and nothing would be done for this with a large dominant and patent left vertebral artery.  Her carotid arteries were clean.  The patient has had several syncopal or presyncopal episodes.  She has had numbness going down both arms and potentially some facial droop.   Past Medical History:  Diagnosis Date   DVT, lower extremity, distal, acute (HCC)    paired right peroneal veins; 2 weeks after back surgery   Palpitations 11/23/2015   SUI (stress urinary incontinence, female)    Wears glasses     Past Surgical History:  Procedure Laterality Date   ABDOMINAL HYSTERECTOMY  1995   W/ RIGHT SALPINGOOPHORECTOMY   EXPLORATORY LAPAROTOMY W/  LEFT SALPINGOOPHORECTOMY  01-25-2001   LAPAROSCOPY'S AND LAPAROTOMY'S FOR PERSISTANT OVARIAN CYST  X 5  1987;  1989;  1990;  1993;  1994   PUBOVAGINAL SLING N/A 10/31/2013   Procedure: LYNX PUBO-VAGINAL SLING;  Surgeon: Miranda LILLETTE Gal, MD;  Location: [facility];  Service: Urology;  Laterality: N/A;     Family History  Problem Relation Age of Onset   Ovarian cancer Mother    Osteoporosis Mother    Breast cancer Mother        Age 14   Diabetes Maternal Aunt    Ovarian cancer Maternal  Grandmother    Colon cancer Paternal Grandfather    Healthy Father    Osteopenia Sister    Healthy Brother       Social History   Tobacco Use   Smoking status: Former    Current packs/day: 0.00    Average packs/day: 1.5 packs/day for 41.0 years (61.6 ttl pk-yrs)    Types: Cigarettes    Start date: 10/26/1980    Quit date: 2023    Years since quitting: 2.8   Smokeless tobacco: Never  Vaping Use   Vaping status: Never Used  Substance Use Topics   Alcohol use: Yes    Alcohol/week: 4.0 standard drinks of alcohol    Types: 4 Standard drinks or equivalent per week     Allergies  Allergen Reactions   Amoxicillin Rash   Latex Rash    Current Outpatient Medications  Medication Sig Dispense Refill   acetaminophen  (TYLENOL ) 500 MG tablet Take 500 mg by mouth 2 (two) times daily.     ALPRAZolam  (XANAX ) 1 MG tablet Take 1 mg by mouth every 8 (eight) hours as needed.     gabapentin  (NEURONTIN ) 300 MG capsule Take 1 capsule (300 mg total) by mouth 3 (three) times daily. 90 capsule 2   predniSONE (DELTASONE) 20 MG tablet Take 3 tablets (60 mg total) by mouth daily with breakfast for 3 days, THEN 2 tablets (40 mg total) daily with  breakfast for 3 days, THEN 1 tablet (20 mg total) daily with breakfast for 3 days. (Patient not taking: No sig reported) 18 tablet 0   No current facility-administered medications for this visit.      REVIEW OF SYSTEMS (Negative unless checked)  Constitutional: [] Weight loss  [] Fever  [] Chills Cardiac: [] Chest pain   [] Chest pressure   [x] Palpitations   [] Shortness of breath when laying flat   [] Shortness of breath at rest   [] Shortness of breath with exertion. Vascular:  [] Pain in legs with walking   [] Pain in legs at rest   [] Pain in legs when laying flat   [] Claudication   [] Pain in feet when walking  [] Pain in feet at rest  [] Pain in feet when laying flat   [] History of DVT   [] Phlebitis   [] Swelling in legs   [] Varicose veins   [] Non-healing  ulcers Pulmonary:   [] Uses home oxygen   [] Productive cough   [] Hemoptysis   [] Wheeze  [] COPD   [] Asthma Neurologic:  [x] Dizziness  [x] Blackouts   [] Seizures   [] History of stroke   [] History of TIA  [] Aphasia   [] Temporary blindness   [] Dysphagia   [x] Weakness or numbness in arms   [x] Weakness or numbness in legs Musculoskeletal:  [x] Arthritis   [] Joint swelling   [x] Joint pain   [] Low back pain Hematologic:  [] Easy bruising  [] Easy bleeding   [] Hypercoagulable state   [] Anemic  [] Hepatitis Gastrointestinal:  [] Blood in stool   [] Vomiting blood  [] Gastroesophageal reflux/heartburn   [] Abdominal pain Genitourinary:  [] Chronic kidney disease   [] Difficult urination  [] Frequent urination  [] Burning with urination   [] Hematuria Skin:  [] Rashes   [] Ulcers   [] Wounds Psychological:  [] History of anxiety   []  History of major depression.    Physical Exam BP 106/72   Pulse (!) 103   Resp 16   Ht 5' 5.5 (1.664 m)   Wt 112 lb (50.8 kg)   BMI 18.35 kg/m  Gen:  WD/WN, NAD Head: [place]/AT, No temporalis wasting.  Ear/Nose/Throat: Hearing grossly intact, nares w/o erythema or drainage, oropharynx w/o Erythema/Exudate Eyes: Conjunctiva clear, sclera non-icteric  Neck: trachea midline.  No JVD.  Pulmonary:  Good air movement, respirations not labored, no use of accessory muscles  Cardiac: tachycardic Vascular:  Vessel Right Left  Radial Palpable Palpable                                   Gastrointestinal:. No masses, surgical incisions, or scars. Musculoskeletal: M/S 5/5 throughout.  Extremities without ischemic changes.  In a cervical collar.  No deformity or atrophy.  No edema. Neurologic: Sensation grossly intact in extremities.  Symmetrical.  Speech is fluent. Motor exam as listed above. Psychiatric: Judgment intact, Mood & affect appropriate for pt's clinical situation. Dermatologic: No rashes or ulcers noted.  No cellulitis or open wounds.    Radiology DG PAIN CLINIC C-ARM 1-60  MIN NO REPORT Result Date: 08/23/2024 Fluoro was used, but no Radiologist interpretation will be provided. Please refer to NOTES tab for provider progress note.   Labs No results found for this or any previous visit (from the past 2160 hours).  Assessment/Plan:  Obstruction of right vertebral artery I have independently reviewed her MR angiogram from approximately 6 months ago.  This demonstrates a large dominant left vertebral artery which is patent and continuous.  The right vertebral artery is diminutive which is likely chronic and possibly  developmentally small.  This is not an important clinical finding and nothing would be done for this with a large dominant and patent left vertebral artery.  Her carotid arteries were clean. I had a long discussion with the patient and her husband today.  This would be extremely unlikely to be causing any of her current symptoms.  She has a diminutive right vertebral artery but a very large and dominant left vertebral artery with normal flow.  There is no intervention or surgery that would be of benefit in terms of revascularization of the vertebral artery on the right side, and would be very unlikely to actually have any positive effect if we tried to do this.  Nothing further would be planned from a vascular workup at this time.  History of DVT (deep vein thrombosis) No recent symptoms worrisome for DVT.  Had a negative study earlier this year.      Selinda Gu 08/30/2024, 9:57 AM   This note was created with Dragon medical transcription system.  Any errors from dictation are unintentional.

## 2024-08-30 NOTE — Assessment & Plan Note (Addendum)
 I have independently reviewed her MR angiogram from approximately 6 months ago.  This demonstrates a large dominant left vertebral artery which is patent and continuous.  The right vertebral artery is diminutive which is likely chronic and possibly developmentally small.  This is not an important clinical finding and nothing would be done for this with a large dominant and patent left vertebral artery.  Her carotid arteries were clean. I had a long discussion with the patient and her husband today.  This would be extremely unlikely to be causing any of her current symptoms.  She has a diminutive right vertebral artery but a very large and dominant left vertebral artery with normal flow.  There is no intervention or surgery that would be of benefit in terms of revascularization of the vertebral artery on the right side, and would be very unlikely to actually have any positive effect if we tried to do this.  Nothing further would be planned from a vascular workup at this time.

## 2024-09-01 ENCOUNTER — Ambulatory Visit: Admitting: Pain Medicine

## 2024-09-08 ENCOUNTER — Ambulatory Visit: Admitting: Gastroenterology

## 2024-09-11 NOTE — Progress Notes (Unsigned)
 PROVIDER NOTE: Interpretation of information contained herein should be left to medically-trained personnel. Specific patient instructions are provided elsewhere under Patient Instructions section of medical record. This document was created in part using AI and STT-dictation technology, any transcriptional errors that may result from this process are unintentional.  Patient: Miranda Fleming  Service: E/M   PCP: System, Provider Not In  DOB: 05/29/1966  DOS: 09/12/2024  Provider: Eric DELENA Como, MD  MRN: [id number]  Delivery: Face-to-face  Specialty: Interventional Pain Management  Type: Established Patient  Setting: Ambulatory outpatient facility  Specialty designation: 09  Referring Prov.: No ref. provider found  Location: Outpatient office facility       History of present illness (HPI) [name], a 58 y.o. year old female, is here today because of her Cervicalgia [M54.2]. Miranda Fleming primary complain today is No chief complaint on file.  Pertinent problems: [name] has Non-traumatic compression fracture of T1 thoracic vertebra, sequela; Pain in right knee; Carpal tunnel syndrome of right wrist; Median neuropathy; Migraine; Neuropathy; Right radial nerve palsy; Ulnar nerve palsy; Chronic pain syndrome; Dupuytren's disease of palm of hands (Bilateral); Chronic thoracic back pain (1ry area of Pain) (Midline); Thoracic spine pain; Osteopenia determined by x-ray; Left cervical radiculopathy; Right cervical radiculopathy; Cervicalgia; Obstruction of right vertebral artery; Stenosis of right vertebral artery; Ulnar neuritis, left; Arthralgia of both knees; Other spondylosis, thoracic region; Abnormal MRI, thoracic spine (02/16/2024); Abnormal MRI, cervical spine (02/16/2024 & 03/23/2024); Non-traumatic compression fracture of T2 thoracic vertebra, sequela; Non-traumatic compression fracture of T3 thoracic vertebra, sequela; Non-traumatic compression fracture of T4 thoracic  vertebra, sequela; Compression of thoracic vertebra, sequela (T1, T2, T3, T4); DDD (degenerative disc disease), thoracic; Thoracic vertebrogenic pain; Thoracic facet arthropathy; Thoracic facet joint pain; Grade 1 Anterolisthesis of thoracic spine (T2-3); Grade 1 Anterolisthesis of cervical spine (C7-T1); DDD (degenerative disc disease), cervical; Cervical spondylosis; Compression fracture of thoracic vertebra with routine healing; Lumbar spine pain; Cervicogenic headache (Bilateral); Cervical facet joint pain (Multilevel) (Bilateral); Cervical facet joint arthropathy; Cervical facet syndrome; Spondylosis without myelopathy or radiculopathy, cervical region; Burning pain (cervical) (Left); Neurogenic pain (cervical) (Bilateral) (L>R); Thoracic back pain; Neck pain; Compression fracture of thoracic vertebra (HCC); Arthralgia of left knee; and Arthralgia of right knee on their pertinent problem list.  Pain Assessment: Severity of   is reported as a  /10. Location:    / . Onset:  . Quality:  . Timing:  . Modifying factor(s):  Miranda Fleming Vitals:  vitals were not taken for this visit.  BMI: Estimated body mass index is 18.35 kg/m as calculated from the following:   Height as of 08/30/24: 5' 5.5 (1.664 m).   Weight as of 08/30/24: 112 lb (50.8 kg).  Last encounter: 08/22/2024. Last procedure: 08/23/2024.  Reason for encounter: post-procedure evaluation and assessment.   Discussed the use of AI scribe software for clinical note transcription with the patient, who gave verbal consent to proceed.  History of Present Illness          Post-Procedure Evaluation   Type: Cervical Epidural Steroid injection (CESI) (Interlaminar) #1  Laterality: Left (-LT)  Level: C7-T1 DOS: 08/23/2024  Provider: Eric DELENA Como, MD Imaging: Fluoroscopy-guided Spinal ([id number]) Anesthesia: Local anesthesia (1-2% Lidocaine ) Anxiolysis: IV Versed  1.0 mg Sedation: Minimal Sedation Fentanyl  1 mL (50 mcg)  Medical  Necessity Purpose: Diagnostic/Therapeutic Rationale (medical necessity): procedure needed and proper for the diagnosis and/or treatment of [name]'s medical symptoms and needs. Indications: Cervicalgia, cervical radicular pain, degenerative disc disease, severe enough  to impact quality of life or function. 1. Cervicalgia   2. Left cervical radiculopathy   3. Cervicogenic headache (Bilateral)   4. Chronic thoracic back pain (1ry area of Pain) (Midline)   5. Compression of thoracic vertebra, sequela (T1, T2, T3, T4)   6. DDD (degenerative disc disease), cervical   7. DDD (degenerative disc disease), thoracic   8. Grade 1 Anterolisthesis of cervical spine (C7-T1)   9. Neurogenic pain (cervical) (Bilateral) (L>R)   10. Non-traumatic compression fracture of T1 thoracic vertebra, sequela   11. Right cervical radiculopathy   12. Stenosis of right vertebral artery   13. Burning pain (cervical) (Left)   14. Abnormal MRI, cervical spine (02/16/2024 & 03/23/2024)    NAS-11 Pain score:   Pre-procedure: 10-Worst pain ever/10   Post-procedure: 1 /10   Note: The patient describes having Dizzy Spells and falls as a consequence of these. She has a history of vertebral artery stenosis and she may be experiencing TIAs. She has been instructed to follow-up with her PCP, ASAP to have this looked into further. She has assured us  that she will, ASAP.    Effectiveness:  Initial hour after procedure:   ***. Subsequent 4-6 hours post-procedure:   ***. Analgesia past initial 6 hours:   ***. Ongoing improvement:  Analgesic:  *** Function:    ***    ROM:    ***     Pharmacotherapy Assessment   Analgesic: No chronic opioid analgesics therapy prescribed by our practice. Tramadol 50 mg tablet, 1 tab p.o. daily (# 30) (30/month) (last filled on 11/28/2023) MME/day: 10 mg/day   Monitoring: [place] PMP: PDMP reviewed during this encounter.       Pharmacotherapy: No side-effects or adverse reactions  reported. Compliance: No problems identified. Effectiveness: Clinically acceptable.  No notes on file  UDS:  Summary  Date Value Ref Range Status  12/07/2023 FINAL  Final    Comment:    ==================================================================== Compliance Drug Analysis, Ur ==================================================================== Specimen Alert Not Detected result may be consistent with the time of last use noted for this medication. AS NEEDED (Tramadol) ==================================================================== Test                             Result       Flag       Units  Drug Present and Declared for Prescription Verification   Alprazolam                      419          EXPECTED   ng/mg creat   Alpha-hydroxyalprazolam        758          EXPECTED   ng/mg creat    Source of alprazolam  is a scheduled prescription medication. Alpha-    hydroxyalprazolam is an expected metabolite of alprazolam .  Drug Present not Declared for Prescription Verification   Ibuprofen                       PRESENT      UNEXPECTED   Diphenhydramine                 PRESENT      UNEXPECTED  Drug Absent but Declared for Prescription Verification   Tramadol                       Not Detected UNEXPECTED ng/mg creat ====================================================================  Test                      Result    Flag   Units      Ref Range   Creatinine              31               mg/dL      >=79 ==================================================================== Declared Medications:  The flagging and interpretation on this report are based on the  following declared medications.  Unexpected results may arise from  inaccuracies in the declared medications.   **Note: The testing scope of this panel includes these medications:   Alprazolam  (Xanax )  Tramadol (Ultram)   **Note: The testing scope of this panel does not include the  following reported  medications:   Calcium  Estradiol  (Estrace )  Fish Oil  Multivitamin  Supplement  Vitamin B12 ==================================================================== For clinical consultation, please call [phone]. ====================================================================     No results found for: CBDTHCR No results found for: D8THCCBX No results found for: D9THCCBX  ROS  Constitutional: Denies any fever or chills Gastrointestinal: No reported hemesis, hematochezia, vomiting, or acute GI distress Musculoskeletal: Denies any acute onset joint swelling, redness, loss of ROM, or weakness Neurological: No reported episodes of acute onset apraxia, aphasia, dysarthria, agnosia, amnesia, paralysis, loss of coordination, or loss of consciousness  Medication Review  ALPRAZolam , acetaminophen , and gabapentin   History Review  Allergy: [name] is allergic to amoxicillin and latex. Drug: [name]  has no history on file for drug use. Alcohol:  reports current alcohol use of about 4.0 standard drinks of alcohol per week. Tobacco:  reports that she quit smoking about 2 years ago. Her smoking use included cigarettes. She started smoking about 43 years ago. She has a 61.6 pack-year smoking history. She has never used smokeless tobacco. Social: [name]  reports that she quit smoking about 2 years ago. Her smoking use included cigarettes. She started smoking about 43 years ago. She has a 61.6 pack-year smoking history. She has never used smokeless tobacco. She reports current alcohol use of about 4.0 standard drinks of alcohol per week. Medical:  has a past medical history of DVT, lower extremity, distal, acute (HCC), Palpitations (11/23/2015), SUI (stress urinary incontinence, female), and Wears glasses. Surgical: Ms. Kemp  has a past surgical history that includes EXPLORATORY LAPAROTOMY W/  LEFT SALPINGOOPHORECTOMY (01-25-2001); Abdominal hysterectomy (1995);  LAPAROSCOPY'S AND LAPAROTOMY'S FOR PERSISTANT OVARIAN CYST (X 5  1987;  1989;  1990;  1993;  1994); and Pubovaginal sling (N/A, 10/31/2013). Family: family history includes Breast cancer in her mother; Colon cancer in her paternal grandfather; Diabetes in her maternal aunt; Healthy in her brother and father; Osteopenia in her sister; Osteoporosis in her mother; Ovarian cancer in her maternal grandmother and mother.  Laboratory Chemistry Profile   Renal Lab Results  Component Value Date   BUN 10 01/10/2024   CREATININE 0.43 (L) 01/10/2024   BCR 19 12/07/2023   GFRAA >60 12/18/2017   GFRNONAA >60 01/10/2024    Hepatic Lab Results  Component Value Date   AST 19 01/10/2024   ALT 22 01/10/2024   ALBUMIN 3.5 01/10/2024   ALKPHOS 45 01/10/2024   LIPASE 35 05/24/2021   AMMONIA <10 01/09/2024    Electrolytes Lab Results  Component Value Date   NA 135 01/10/2024   K 3.1 (L) 01/10/2024   CL 106 01/10/2024   CALCIUM 8.5 (L) 01/10/2024  MG 1.8 01/10/2024    Bone Lab Results  Component Value Date   VD25OH 50.8 12/18/2017   25OHVITD1 68 12/07/2023   25OHVITD2 41 12/07/2023   25OHVITD3 27 12/07/2023    Inflammation (CRP: Acute Phase) (ESR: Chronic Phase) Lab Results  Component Value Date   CRP <1 12/07/2023   ESRSEDRATE 3 12/07/2023   LATICACIDVEN 1.4 01/09/2024         Note: Above Lab results reviewed.  Recent Imaging Review  DG PAIN CLINIC C-ARM 1-60 MIN NO REPORT Fluoro was used, but no Radiologist interpretation will be provided.  Please refer to NOTES tab for provider progress note. Note: Reviewed        Physical Exam  Vitals: There were no vitals taken for this visit. BMI: Estimated body mass index is 18.35 kg/m as calculated from the following:   Height as of 08/30/24: 5' 5.5 (1.664 m).   Weight as of 08/30/24: 112 lb (50.8 kg). Ideal: Ideal body weight: 58.2 kg (128 lb 3.2 oz) General appearance: Well nourished, well developed, and well hydrated. In no  apparent acute distress Mental status: Alert, oriented x 3 (person, place, & time)       Respiratory: No evidence of acute respiratory distress Eyes: PERLA   Assessment   Diagnosis Status  1. Cervicalgia   2. Left cervical radiculopathy   3. Cervicogenic headache (Bilateral)   4. Chronic thoracic back pain (1ry area of Pain) (Midline)   5. Postop check    Controlled Controlled Controlled   Updated Problems: No problems updated.  Plan of Care  Problem-specific:  Assessment and Plan            [name] has a current medication list which includes the following long-term medication(s): gabapentin .  Pharmacotherapy (Medications Ordered): No orders of the defined types were placed in this encounter.  Orders:  No orders of the defined types were placed in this encounter.    Interventional Therapies  Risk Factors  Considerations  Medical Comorbidities:  Allergy: LATEX  Hx. DVT     Planned  Pending:    Diagnostic left cervical ESI #1    Under consideration:   Diagnostic left cervical ESI #1    Completed:   Diagnostic/therapeutic bilateral cervical facet (TON-C6) MBB x1 (05/24/2024) (100/100/0/0)  Diagnostic/therapeutic midline (T2-3) thoracic ESI x1 (12/24/2023) (100/100/5/RUE:100LUE:90UB:5-10)  Therapeutic left (T2-3) thoracic ESI x1 (03/10/2024) (100/100/90x36hrs/45)  Therapeutic left (T3-4) thoracic ESI x1 (04/07/2024) (DNKFU-Returned 05/11/2024) (0/0/0/0)    Therapeutic  Palliative (PRN) options:   None established   Completed by other providers:   None reported     No follow-ups on file.    Recent Visits Date Type Provider Dept  08/23/24 Procedure visit Tanya Glisson, MD [facility]  08/22/24 Office Visit Tanya Glisson, MD [facility]  Showing recent visits within past 90 days and meeting all other requirements Future Appointments Date Type Provider Dept  09/12/24 Appointment Tanya Glisson,  MD [facility]  09/13/24 Appointment Tanya Glisson, MD [facility]  Showing future appointments within next 90 days and meeting all other requirements  I discussed the assessment and treatment plan with the patient. The patient was provided an opportunity to ask questions and all were answered. The patient agreed with the plan and demonstrated an understanding of the instructions.  Patient advised to call back or seek an in-person evaluation if the symptoms or condition worsens.  Duration of encounter: *** minutes.  Total time on encounter, as per [facility]  guidelines included both the face-to-face and non-face-to-face time personally spent by the physician and/or other qualified health care professional(s) on the day of the encounter (includes time in activities that require the physician or other qualified health care professional and does not include time in activities normally performed by clinical staff). Physician's time may include the following activities when performed: Preparing to see the patient (e.g., pre-charting review of records, searching for previously ordered imaging, lab work, and nerve conduction tests) Review of prior analgesic pharmacotherapies. Reviewing PMP Interpreting ordered tests (e.g., lab work, imaging, nerve conduction tests) Performing post-procedure evaluations, including interpretation of diagnostic procedures Obtaining and/or reviewing separately obtained history Performing a medically appropriate examination and/or evaluation Counseling and educating the patient/family/caregiver Ordering medications, tests, or procedures Referring and communicating with other health care professionals (when not separately reported) Documenting clinical information in the electronic or other health record Independently interpreting results (not separately reported) and communicating results to the patient/ family/caregiver Care coordination (not separately  reported)  Note by: Eric DELENA Como, MD (TTS and AI technology used. I apologize for any typographical errors that were not detected and corrected.) Date: 09/12/2024; Time: 8:12 AM

## 2024-09-12 ENCOUNTER — Encounter: Payer: Self-pay | Admitting: Pain Medicine

## 2024-09-12 ENCOUNTER — Ambulatory Visit: Attending: Pain Medicine | Admitting: Pain Medicine

## 2024-09-12 VITALS — BP 113/69 | HR 99 | Temp 97.4°F | Resp 15 | Ht 65.0 in | Wt 120.0 lb

## 2024-09-12 DIAGNOSIS — M542 Cervicalgia: Secondary | ICD-10-CM | POA: Insufficient documentation

## 2024-09-12 DIAGNOSIS — G4486 Cervicogenic headache: Secondary | ICD-10-CM | POA: Diagnosis present

## 2024-09-12 DIAGNOSIS — M546 Pain in thoracic spine: Secondary | ICD-10-CM | POA: Insufficient documentation

## 2024-09-12 DIAGNOSIS — Z09 Encounter for follow-up examination after completed treatment for conditions other than malignant neoplasm: Secondary | ICD-10-CM | POA: Insufficient documentation

## 2024-09-12 DIAGNOSIS — M5412 Radiculopathy, cervical region: Secondary | ICD-10-CM | POA: Insufficient documentation

## 2024-09-12 DIAGNOSIS — G8929 Other chronic pain: Secondary | ICD-10-CM | POA: Insufficient documentation

## 2024-09-12 NOTE — Progress Notes (Signed)
 Safety precautions to be maintained throughout the outpatient stay will include: orient to surroundings, keep bed in low position, maintain call bell within reach at all times, provide assistance with transfer out of bed and ambulation.

## 2024-09-13 ENCOUNTER — Ambulatory Visit: Admitting: Pain Medicine

## 2024-09-13 ENCOUNTER — Ambulatory Visit (HOSPITAL_BASED_OUTPATIENT_CLINIC_OR_DEPARTMENT_OTHER): Admitting: Pain Medicine

## 2024-09-13 ENCOUNTER — Ambulatory Visit
Admission: RE | Admit: 2024-09-13 | Discharge: 2024-09-13 | Disposition: A | Discharge status: Home / Self Care | Source: Ambulatory Visit | Attending: Pain Medicine | Admitting: Pain Medicine

## 2024-09-13 ENCOUNTER — Encounter: Payer: Self-pay | Admitting: Pain Medicine

## 2024-09-13 VITALS — BP 120/66 | HR 108 | Temp 97.4°F | Resp 16 | Ht 65.0 in | Wt 120.0 lb

## 2024-09-13 DIAGNOSIS — M4854XA Collapsed vertebra, not elsewhere classified, thoracic region, initial encounter for fracture: Secondary | ICD-10-CM | POA: Diagnosis not present

## 2024-09-13 DIAGNOSIS — S22000S Wedge compression fracture of unspecified thoracic vertebra, sequela: Secondary | ICD-10-CM

## 2024-09-13 DIAGNOSIS — M5010 Cervical disc disorder with radiculopathy, unspecified cervical region: Secondary | ICD-10-CM | POA: Diagnosis present

## 2024-09-13 DIAGNOSIS — M4854XS Collapsed vertebra, not elsewhere classified, thoracic region, sequela of fracture: Secondary | ICD-10-CM

## 2024-09-13 DIAGNOSIS — M5412 Radiculopathy, cervical region: Secondary | ICD-10-CM

## 2024-09-13 DIAGNOSIS — M4312 Spondylolisthesis, cervical region: Secondary | ICD-10-CM | POA: Diagnosis not present

## 2024-09-13 DIAGNOSIS — Z91040 Latex allergy status: Secondary | ICD-10-CM

## 2024-09-13 DIAGNOSIS — M542 Cervicalgia: Secondary | ICD-10-CM

## 2024-09-13 DIAGNOSIS — R52 Pain, unspecified: Secondary | ICD-10-CM

## 2024-09-13 DIAGNOSIS — M5030 Other cervical disc degeneration, unspecified cervical region: Secondary | ICD-10-CM

## 2024-09-13 MED ORDER — DEXAMETHASONE SOD PHOSPHATE PF 10 MG/ML IJ SOLN
10.0000 mg | Freq: Once | INTRAMUSCULAR | Status: AC
  Administered 2024-09-13: 10 mg

## 2024-09-13 MED ORDER — IOHEXOL 180 MG/ML  SOLN
10.0000 mL | Freq: Once | INTRAMUSCULAR | Status: AC
  Administered 2024-09-13: 10 mL via EPIDURAL

## 2024-09-13 MED ORDER — IOHEXOL 180 MG/ML  SOLN
INTRAMUSCULAR | Status: AC
  Filled 2024-09-13: qty 10

## 2024-09-13 MED ORDER — PENTAFLUOROPROP-TETRAFLUOROETH EX AERO
INHALATION_SPRAY | Freq: Once | CUTANEOUS | Status: AC
  Administered 2024-09-13: 30 via TOPICAL

## 2024-09-13 MED ORDER — FENTANYL CITRATE (PF) 100 MCG/2ML IJ SOLN: 25.0000 ug | INTRAMUSCULAR | Status: DC | PRN

## 2024-09-13 MED ORDER — ROPIVACAINE HCL 2 MG/ML IJ SOLN
INTRAMUSCULAR | Status: AC
  Filled 2024-09-13: qty 20

## 2024-09-13 MED ORDER — ROPIVACAINE HCL 2 MG/ML IJ SOLN
1.0000 mL | Freq: Once | INTRAMUSCULAR | Status: AC
  Administered 2024-09-13: 1 mL via EPIDURAL

## 2024-09-13 MED ORDER — LIDOCAINE HCL (PF) 2 % IJ SOLN
INTRAMUSCULAR | Status: AC
  Filled 2024-09-13: qty 10

## 2024-09-13 MED ORDER — MIDAZOLAM HCL 5 MG/5ML IJ SOLN
0.5000 mg | Freq: Once | INTRAMUSCULAR | Status: AC
  Administered 2024-09-13: 1 mg via INTRAVENOUS

## 2024-09-13 MED ORDER — LIDOCAINE HCL 2 % IJ SOLN
20.0000 mL | Freq: Once | INTRAMUSCULAR | Status: AC
  Administered 2024-09-13: 100 mg

## 2024-09-13 MED ORDER — MIDAZOLAM HCL 5 MG/5ML IJ SOLN
INTRAMUSCULAR | Status: AC
  Filled 2024-09-13: qty 5

## 2024-09-13 MED ORDER — SODIUM CHLORIDE (PF) 0.9 % IJ SOLN
INTRAMUSCULAR | Status: AC
Start: 2024-09-13 — End: 2024-09-13
  Filled 2024-09-13: qty 10

## 2024-09-13 MED ORDER — SODIUM CHLORIDE 0.9% FLUSH
1.0000 mL | Freq: Once | INTRAVENOUS | Status: AC
  Administered 2024-09-13: 1 mL

## 2024-09-13 MED ORDER — FENTANYL CITRATE (PF) 100 MCG/2ML IJ SOLN
INTRAMUSCULAR | Status: AC
  Filled 2024-09-13: qty 2

## 2024-09-13 NOTE — Patient Instructions (Signed)
 ______________________________________________________________________    Post-Procedure Discharge Instructions  INSTRUCTIONS Apply ice:  Purpose: This will minimize any swelling and discomfort after procedure.  When: Day of procedure, as soon as you get home. How: Fill a plastic sandwich bag with crushed ice. Cover it with a small towel and apply to injection site. How long: (15 min on, 15 min off) Apply for 15 minutes then remove x 15 minutes.  Repeat sequence on day of procedure, until you go to bed. Apply heat:  Purpose: To treat any soreness and discomfort from the procedure. When: Starting the next day after the procedure. How: Apply heat to procedure site starting the day following the procedure. How long: May continue to repeat daily, until discomfort goes away. Food intake: Start with clear liquids (like water) and advance to regular food, as tolerated.  Physical activities: Keep activities to a minimum for the first 8 hours after the procedure. After that, then as tolerated. Driving: If you have received any sedation, be responsible and do not drive. You are not allowed to drive for 24 hours after having sedation. Blood thinner: (Applies only to those taking blood thinners) You may restart your blood thinner 6 hours after your procedure. Insulin: (Applies only to Diabetic patients taking insulin) As soon as you can eat, you may resume your normal dosing schedule. Infection prevention: Keep procedure site clean and dry. Shower daily and clean area with soap and water.  PAIN DIARY Post-procedure Pain Diary: Extremely important that this be done correctly and accurately. Recorded information will be used to determine the next step in treatment. For the purpose of accuracy, follow these rules: Evaluate only the area treated. Do not report or include pain from an untreated area. For the purpose of this evaluation, ignore all other areas of pain, except for the treated area. After your  procedure, avoid taking a long nap and attempting to complete the pain diary after you wake up. Instead, set your alarm clock to go off every hour, on the hour, for the initial 8 hours after the procedure. Document the duration of the numbing medicine, and the relief you are getting from it. Do not go to sleep and attempt to complete it later. It will not be accurate. If you received sedation, it is likely that you were given a medication that may cause amnesia. Because of this, completing the diary at a later time may cause the information to be inaccurate. This information is needed to plan your care. Follow-up appointment: Keep your post-procedure follow-up evaluation appointment after the procedure (usually 2 weeks for most procedures, 6 weeks for radiofrequencies). DO NOT FORGET to bring you pain diary with you.   EXPECT... (What should I expect to see with my procedure?) From numbing medicine (AKA: Local Anesthetics): Numbness or decrease in pain. You may also experience some weakness, which if present, could last for the duration of the local anesthetic. Onset: Full effect within 15 minutes of injected. Duration: It will depend on the type of local anesthetic used. On the average, 1 to 8 hours.  From steroids (Applies only if steroids were used): Decrease in swelling or inflammation. Once inflammation is improved, relief of the pain will follow. Onset of benefits: Depends on the amount of swelling present. The more swelling, the longer it will take for the benefits to be seen. In some cases, up to 10 days. Duration: Steroids will stay in the system x 2 weeks. Duration of benefits will depend on multiple posibilities including persistent irritating  factors. Side-effects: If present, they may typically last 2 weeks (the duration of the steroids). Frequent: Cramps (if they occur, drink Gatorade and take over-the-counter Magnesium 450-500 mg once to twice a day); water retention with temporary weight  gain; increases in blood sugar; decreased immune system response; increased appetite. Occasional: Facial flushing (red, warm cheeks); mood swings; menstrual changes. Uncommon: Long-term decrease or suppression of natural hormones; bone thinning. (These are more common with higher doses or more frequent use. This is why we prefer that our patients avoid having any injection therapies in other practices.)  Very Rare: Severe mood changes; psychosis; aseptic necrosis. From procedure: Some discomfort is to be expected once the numbing medicine wears off. This should be minimal if ice and heat are applied as instructed.  CALL IF... (When should I call?) You experience numbness and weakness that gets worse with time, as opposed to wearing off. New onset bowel or bladder incontinence. (Applies only to procedures done in the spine)  Emergency Numbers: Durning business hours (Monday - Thursday, 8:00 AM - 4:00 PM) (Friday, 9:00 AM - 12:00 Noon): [phone] After hours: [phone] NOTE: If you are having a problem and are unable connect with, or to talk to a provider, then go to your nearest urgent care or emergency department. If the problem is serious and urgent, please call 911. ______________________________________________________________________     ______________________________________________________________________    Steroid injections  Common steroids for injections Triamcinolone : Used by many sports medicine physicians for large joint and bursal injections, often combined with a local anesthetic like lidocaine . A study focusing on coccydynia (tailbone pain) found triamcinolone  was more effective than betamethasone, suggesting it may also be preferable for other localized inflammation conditions. Methylprednisolone: A common alternative to triamcinolone  that is also a strong anti-inflammatory. It is available in different formulations, with the acetate suspension being the long-acting  option for intra-articular injections. Dexamethasone : This is a non-particulate steroid, meaning it has a lower risk of tissue damage compared to particulate steroids like triamcinolone  and methylprednisolone. While less common for this specific use, it is an option for targeted injections.   Considerations for physicians Particulate vs. non-particulate steroids: Triamcinolone  and methylprednisolone are particulate, meaning they can clump together. Dexamethasone  is non-particulate. Particulate steroids are often preferred for their longer-lasting effects but carry a theoretical higher risk for certain injections (though this is less of a concern in the costochondral joints). Combined injectate: Corticosteroids are typically mixed with a local anesthetic like lidocaine  to provide both immediate pain relief (from the anesthetic) and longer-term inflammation reduction (from the steroid). Imaging guidance: To ensure accurate placement of the needle and medication, physicians may use ultrasound or fluoroscopic guidance for the injection, especially in complex or refractory cases.   Patient guidance Before undergoing a steroid injection, discuss the options with your physician. They will determine the best steroid, dosage, and procedure for your specific case based on factors like: Severity of your condition History of response to other treatments Your overall health status Experience and preference of the physician  Last  Updated: 07/05/2024 ______________________________________________________________________

## 2024-09-13 NOTE — Progress Notes (Signed)
 PROVIDER NOTE: Interpretation of information contained herein should be left to medically-trained personnel. Specific patient instructions are provided elsewhere under Patient Instructions section of medical record. This document was created in part using STT-dictation technology, any transcriptional errors that may result from this process are unintentional.  Patient: Miranda Fleming Type: Established DOB: [birth year] MRN: [id number] PCP: System, Provider Not In  Service: Procedure DOS: 09/13/2024 Setting: Ambulatory Location: Ambulatory outpatient facility Delivery: Face-to-face Provider: Eric DELENA Como, MD Specialty: Interventional Pain Management Specialty designation: 09 Location: Outpatient facility Ref. Prov.: No ref. provider found       Interventional Therapy   Type: Cervical Epidural Steroid injection (CESI) (Interlaminar) #2  Laterality: Left (-LT)  Level: C7-T1 DOS: 09/13/2024  Provider: Eric DELENA Como, MD Imaging: Fluoroscopy-guided Spinal ([id number]) Anesthesia: Local anesthesia (1-2% Lidocaine ) Anxiolysis: IV Versed  2.0 mg Sedation: Minimal Sedation None required. No Fentanyl  administered.          Medical Necessity Purpose: Diagnostic/Therapeutic Rationale (medical necessity): procedure needed and proper for the diagnosis and/or treatment of Miranda Fleming's medical symptoms and needs. Indications: Cervicalgia, cervical radicular pain, degenerative disc disease, severe enough to impact quality of life or function. 1. Left cervical radiculopathy   2. Cervicalgia   3. Burning pain (cervical) (Left)   4. Compression of thoracic vertebra, sequela (T1, T2, T3, T4)   5. DDD (degenerative disc disease), cervical   6. Grade 1 Anterolisthesis of cervical spine (C7-T1)   7. Non-traumatic compression fracture of T1 thoracic vertebra, sequela    NAS-11 Pain score:   Pre-procedure: 5 /10   Post-procedure: 0-No pain/10     Position  Prep  Materials:   Location setting: Procedure suite Position: Prone, on modified reverse trendelenburg to facilitate breathing, with head in head-cradle. Pillows positioned under chest (below chin-level) with cervical spine flexed. Safety Precautions: Patient was assessed for positional comfort and pressure points before starting the procedure. Prepping solution: DuraPrep (Iodine Povacrylex [0.7% available iodine] and Isopropyl Alcohol, 74% w/w) Prep Area: Entire  cervicothoracic region Approach: percutaneous, paramedial Intended target: Posterior cervical epidural space Materials Procedure:  Tray: Epidural Needle(s): Epidural (Tuohy) Qty: 1 Length: (90mm) 3.5-inch Gauge: 17G  H&P (Pre-op Assessment):  Miranda Fleming is a 58 y.o. (year old), female patient, seen today for interventional treatment. She  has a past surgical history that includes EXPLORATORY LAPAROTOMY W/  LEFT SALPINGOOPHORECTOMY (01-25-2001); Abdominal hysterectomy (1995); LAPAROSCOPY'S AND LAPAROTOMY'S FOR PERSISTANT OVARIAN CYST (X 5  1987;  1989;  1990;  1993;  1994); and Pubovaginal sling (N/A, 10/31/2013). Miranda Fleming has a current medication list which includes the following prescription(s): acetaminophen , alprazolam , and gabapentin , and the following Facility-Administered Medications: fentanyl . Her primarily concern today is the Back Pain and Shoulder Pain (Between shoulder blades)  Initial Vital Signs:  Pulse/HCG Rate: (!) 108ECG Heart Rate: 85 Temp: (!) 97.4 F (36.3 C) Resp: 16 BP: 114/78 SpO2: 100 %  BMI: Estimated body mass index is 19.97 kg/m as calculated from the following:   Height as of this encounter: 5' 5 (1.651 m).   Weight as of this encounter: 120 lb (54.4 kg).  Risk Assessment: Allergies: Reviewed. She is allergic to amoxicillin and latex.  Allergy Precautions: None required Coagulopathies: Reviewed. None identified.  Blood-thinner therapy: None at this time Active Infection(s): Reviewed. None identified. [name] is afebrile  Site Confirmation: Miranda Fleming was asked to confirm the procedure and laterality before marking the site Procedure checklist: Completed Consent: Before the procedure and under the influence of no sedative(s), amnesic(s), or anxiolytics,  the patient was informed of the treatment options, risks and possible complications. To fulfill our ethical and legal obligations, as recommended by the American Medical Association's Code of Ethics, I have informed the patient of my clinical impression; the nature and purpose of the treatment or procedure; the risks, benefits, and possible complications of the intervention; the alternatives, including doing nothing; the risk(s) and benefit(s) of the alternative treatment(s) or procedure(s); and the risk(s) and benefit(s) of doing nothing. The patient was provided information about the general risks and possible complications associated with the procedure. These may include, but are not limited to: failure to achieve desired goals, infection, bleeding, organ or nerve damage, allergic reactions, paralysis, and death. In addition, the patient was informed of those risks and complications associated to Spine-related procedures, such as failure to decrease pain; infection (i.e.: Meningitis, epidural or intraspinal abscess); bleeding (i.e.: epidural hematoma, subarachnoid hemorrhage, or any other type of intraspinal or peri-dural bleeding); organ or nerve damage (i.e.: Any type of peripheral nerve, nerve root, or spinal cord injury) with subsequent damage to sensory, motor, and/or autonomic systems, resulting in permanent pain, numbness, and/or weakness of one or several areas of the body; allergic reactions; (i.e.: anaphylactic reaction); and/or death. Furthermore, the patient was informed of those risks and complications associated with the medications. These include, but are not limited to: allergic reactions (i.e.: anaphylactic or anaphylactoid  reaction(s)); adrenal axis suppression; blood sugar elevation that in diabetics may result in ketoacidosis or comma; water  retention that in patients with history of congestive heart failure may result in shortness of breath, pulmonary edema, and decompensation with resultant heart failure; weight gain; swelling or edema; medication-induced neural toxicity; particulate matter embolism and blood vessel occlusion with resultant organ, and/or nervous system infarction; and/or aseptic necrosis of one or more joints. Finally, the patient was informed that Medicine is not an exact science; therefore, there is also the possibility of unforeseen or unpredictable risks and/or possible complications that may result in a catastrophic outcome. The patient indicated having understood very clearly. We have given the patient no guarantees and we have made no promises. Enough time was given to the patient to ask questions, all of which were answered to the patient's satisfaction. Miranda Fleming has indicated that she wanted to continue with the procedure. Attestation: I, the ordering provider, attest that I have discussed with the patient the benefits, risks, side-effects, alternatives, likelihood of achieving goals, and potential problems during recovery for the procedure that I have provided informed consent. Date  Time: 09/13/2024  8:53 AM  Pre-Procedure Preparation:  Monitoring: As per clinic protocol. Respiration, ETCO2, SpO2, BP, heart rate and rhythm monitor placed and checked for adequate function Safety Precautions: Patient was assessed for positional comfort and pressure points before starting the procedure. Time-out: I initiated and conducted the Time-out before starting the procedure, as per protocol. The patient was asked to participate by confirming the accuracy of the Time Out information. Verification of the correct person, site, and procedure were performed and confirmed by me, the nursing staff, and the  patient. Time-out conducted as per Joint Commission's Universal Protocol (UP.01.01.01). Time: 0936 Start Time: 0936 hrs.  Description  Narrative of Procedure:          Rationale (medical necessity): procedure needed and proper for the diagnosis and/or treatment of the patient's medical symptoms and needs. Start Time: 0936 hrs. Safety Precautions: Aspiration looking for blood return was conducted prior to all injections. At no point did we inject any substances, as a  needle was being advanced. No attempts were made at seeking any paresthesias. Safe injection practices and needle disposal techniques used. Medications properly checked for expiration dates. SDV (single dose vial) medications used. Description of procedure: Protocol guidelines were followed. The patient was assisted into a comfortable position. The target area was identified and the area prepped in the usual manner. Skin & deeper tissues infiltrated with local anesthetic. Appropriate amount of time allowed to pass for local anesthetics to take effect. Using fluoroscopic guidance, the epidural needle was introduced through the skin, ipsilateral to the reported pain, and advanced to the target area. Posterior laminar os was contacted and the needle walked caudad, until the lamina was cleared. The ligamentum flavum was engaged and the epidural space identified using "loss-of-resistance technique" with 2-3 ml of PF-NaCl (0.9% NSS), in a 5cc dedicated LOR syringe. (See Imaging guidance below for use of contrast details.) Once proper needle placement was secured, and negative aspiration confirmed, the solution was injected in intermittent fashion, asking for systemic symptoms every 0.5cc. The needles were then removed and the area cleansed, making sure to leave some of the prepping solution back to take advantage of its long term bactericidal properties.  Vitals:   09/13/24 0932 09/13/24 0937 09/13/24 0944 09/13/24 0950  BP: 125/80 115/71  113/65 120/66  Pulse:      Resp: 17 15 17 16   Temp:      SpO2: 100% 100% 100% 100%  Weight:      Height:         End Time: 0943 hrs.  Imaging Guidance (Spinal):          Type of Imaging Technique: Fluoroscopy Guidance (Spinal) Indication(s): Fluoroscopy guidance for needle placement to enhance accuracy in procedures requiring precise needle localization for targeted delivery of medication in or near specific anatomical locations not easily accessible without such real-time imaging assistance. Exposure Time: Please see nurses notes. Contrast: Before injecting any contrast, we confirmed that the patient did not have an allergy to iodine, shellfish, or radiological contrast. Once satisfactory needle placement was completed at the desired level, radiological contrast was injected. Contrast injected under live fluoroscopy. No contrast complications. See chart for type and volume of contrast used. Fluoroscopic Guidance: I was personally present during the use of fluoroscopy. Tunnel Vision Technique used to obtain the best possible view of the target area. Parallax error corrected before commencing the procedure. Direction-depth-direction technique used to introduce the needle under continuous pulsed fluoroscopy. Once target was reached, antero-posterior, oblique, and lateral fluoroscopic projection used confirm needle placement in all planes. Images permanently stored in EMR. Interpretation: I personally interpreted the imaging intraoperatively. Adequate needle placement confirmed in multiple planes. Appropriate spread of contrast into desired area was observed. No evidence of afferent or efferent intravascular uptake. No intrathecal or subarachnoid spread observed. Permanent images saved into the patient's record.  Post-operative Assessment:  Post-procedure Vital Signs:  Pulse/HCG Rate: (!) 10880 Temp: (!) 97.4 F (36.3 C) Resp: 16 BP: 120/66 SpO2: 100 %  EBL: None  Complications: No  immediate post-treatment complications observed by team, or reported by patient.  Note: The patient tolerated the entire procedure well. A repeat set of vitals were taken after the procedure and the patient was kept under observation following institutional policy, for this type of procedure. Post-procedural neurological assessment was performed, showing return to baseline, prior to discharge. The patient was provided with post-procedure discharge instructions, including a section on how to identify potential problems. Should any problems arise concerning this procedure,  the patient was given instructions to immediately contact us , at any time, without hesitation. In any case, we plan to contact the patient by telephone for a follow-up status report regarding this interventional procedure.  Comments:  No additional relevant information.  Plan of Care (POC)  Orders:  Orders Placed This Encounter  Procedures   Cervical Epidural Injection    Indication(s): Radiculitis and cervicalgia associated with cervical degenerative disc disease. Position: Prone Imaging guidance: Fluoroscopy required. Contrast required unless contraindicated by allergy or severe CKD. Equipment & Materials: Epidural tray & needle.    Scheduling Instructions:     Procedure: Cervical Epidural Steroid Injection/Block     Planned Level(s): C7-T1     Laterality: Left-sided     Anxiolysis: Patient's choice.     Date: 09/13/2024    Where will this procedure be performed?:   [facility]             by Dr. [facility] 1-60 MIN NO REPORT    Intraoperative interpretation by procedural physician at [facility].    Standing Status:   Standing    Number of Occurrences:   1    Reason for exam::   Assistance in needle guidance and placement for procedures requiring needle placement in or near specific anatomical locations not easily accessible without such assistance.   Informed Consent Details:  Physician/Practitioner Attestation; Transcribe to consent form and obtain patient signature    Nursing instructions: Transcribe to consent form and obtain patient signature. Always confirm laterality of pain with Miranda Fleming, before procedure.    Physician/Practitioner attestation of informed consent for procedure/surgical case:   I, the physician/practitioner, attest that I have discussed with the patient the benefits, risks, side effects, alternatives, likelihood of achieving goals and potential problems during recovery for the procedure that I have provided informed consent.    Procedure:   Cervical Epidural Steroid Injection (CESI) under fluoroscopic guidance    Physician/Practitioner performing the procedure:   Annemarie Sebree A. Tanya MD    Indication/Reason:   Indications: Cervicalgia (neck pain), cervical radicular pain, radiculitis (arm/shoulder pain, numbness, and/or weakness), degenerative disc disease, severe enough to greatly impact quality of life or function.   Provide equipment / supplies at bedside    Procedural tray: Epidural Tray (Disposable  single use) Skin infiltration needle: Regular 1.5-in, 25-G, (x1) Block needle size: Regular standard Catheter: No catheter required    Standing Status:   Standing    Number of Occurrences:   1    Specify:   Epidural Tray   Saline lock IV    Have LR [phone] mL available and administer at 125 mL/hr if patient becomes hypotensive.    Standing Status:   Standing    Number of Occurrences:   1   Latex precautions    Activate Latex-Free Protocol.    Standing Status:   Standing    Number of Occurrences:   1     Analgesic: No chronic opioid analgesics therapy prescribed by our practice. Tramadol 50 mg tablet, 1 tab p.o. daily (# 30) (30/month) (last filled on 11/28/2023) MME/day: 10 mg/day    Medications ordered for procedure: Meds ordered this encounter  Medications   iohexol  (OMNIPAQUE ) 180 MG/ML injection 10 mL    Must be  Myelogram-compatible. If not available, you may substitute with a water -soluble, non-ionic, hypoallergenic, myelogram-compatible radiological contrast medium.   lidocaine  (XYLOCAINE ) 2 % (with pres) injection 400 mg   pentafluoroprop-tetrafluoroeth (GEBAUERS) aerosol  midazolam  (VERSED ) 5 MG/5ML injection 0.5-2 mg    Make sure Flumazenil is available in the pyxis when using this medication. If oversedation occurs, administer 0.2 mg IV over 15 sec. If after 45 sec no response, administer 0.2 mg again over 1 min; may repeat at 1 min intervals; not to exceed 4 doses (1 mg)   fentaNYL  (SUBLIMAZE ) injection 25-50 mcg    Make sure Narcan is available in the pyxis when using this medication. In the event of respiratory depression (RR< 8/min): Titrate NARCAN (naloxone) in increments of 0.1 to 0.2 mg IV at 2-3 minute intervals, until desired degree of reversal.   sodium chloride  flush (NS) 0.9 % injection 1 mL   ropivacaine  (PF) 2 mg/mL (0.2%) (NAROPIN ) injection 1 mL   dexamethasone  (DECADRON ) injection 10 mg   Medications administered: We administered iohexol , lidocaine , pentafluoroprop-tetrafluoroeth, midazolam , sodium chloride  flush, ropivacaine  (PF) 2 mg/mL (0.2%), and dexamethasone .  See the medical record for exact dosing, route, and time of administration.    Interventional Therapies  Risk Factors  Considerations  Medical Comorbidities:  Allergy: LATEX  Hx. DVT     Planned  Pending:    Diagnostic left cervical ESI #2    Under consideration:   Diagnostic left cervical ESI #2    Completed:   Diagnostic left cervical ESI x1 (08/23/2024) (100/100/80/80)  Diagnostic/therapeutic bilateral cervical facet (TON-C6) MBB x1 (05/24/2024) (100/100/0/0)  Diagnostic/therapeutic midline (T2-3) thoracic ESI x1 (12/24/2023) (100/100/5/RUE:100LUE:90UB:5-10)  Therapeutic left (T2-3) thoracic ESI x1 (03/10/2024) (100/100/90x36hrs/45)  Therapeutic left (T3-4) thoracic ESI x1 (04/07/2024)  (DNKFU-Returned 05/11/2024) (0/0/0/0)    Therapeutic  Palliative (PRN) options:   None established   Completed by other providers:   None reported      Follow-up plan:   Return in about 2 weeks (around 09/27/2024) for (Face2F), (PPE).     Recent Visits Date Type Provider Dept  09/12/24 Office Visit Tanya Glisson, MD [facility]  08/23/24 Procedure visit Tanya Glisson, MD [facility]  08/22/24 Office Visit Tanya Glisson, MD [facility]  Showing recent visits within past 90 days and meeting all other requirements Today's Visits Date Type Provider Dept  09/13/24 Procedure visit Tanya Glisson, MD [facility]  Showing today's visits and meeting all other requirements Future Appointments Date Type Provider Dept  10/05/24 Appointment Tanya Glisson, MD [facility]  Showing future appointments within next 90 days and meeting all other requirements   Disposition: Discharge home  Discharge (Date  Time): 09/13/2024;   hrs.   Primary Care Physician: System, Provider Not In Location: [facility] Note by: Glisson DELENA Tanya, MD (TTS technology used. I apologize for any typographical errors that were not detected and corrected.) Date: 09/13/2024; Time: 10:03 AM  Disclaimer:  Medicine is not an visual merchandiser. The only guarantee in medicine is that nothing is guaranteed. It is important to note that the decision to proceed with this intervention was based on the information collected from the patient. The Data and conclusions were drawn from the patient's questionnaire, the interview, and the physical examination. Because the information was provided in large part by the patient, it cannot be guaranteed that it has not been purposely or unconsciously manipulated. Every effort has been made to obtain as much relevant data as possible for this evaluation. It is important to note that the  conclusions that lead to this procedure are derived in large part from the available data. Always take into account that the treatment will also  be dependent on availability of resources and existing treatment guidelines, considered by other Pain Management Practitioners as being common knowledge and practice, at the time of the intervention. For Medico-Legal purposes, it is also important to point out that variation in procedural techniques and pharmacological choices are the acceptable norm. The indications, contraindications, technique, and results of the above procedure should only be interpreted and judged by a Board-Certified Interventional Pain Specialist with extensive familiarity and expertise in the same exact procedure and technique.

## 2024-09-13 NOTE — Progress Notes (Signed)
 Safety precautions to be maintained throughout the outpatient stay will include: orient to surroundings, keep bed in low position, maintain call bell within reach at all times, provide assistance with transfer out of bed and ambulation.

## 2024-09-14 ENCOUNTER — Telehealth: Payer: Self-pay

## 2024-09-14 DIAGNOSIS — F4322 Adjustment disorder with anxiety: Secondary | ICD-10-CM | POA: Diagnosis not present

## 2024-09-14 NOTE — Telephone Encounter (Signed)
 Post procedure follow up. Patient states she is doing fine.

## 2024-09-16 ENCOUNTER — Encounter (HOSPITAL_COMMUNITY): Payer: Self-pay | Admitting: Nurse Practitioner

## 2024-09-21 DIAGNOSIS — F4322 Adjustment disorder with anxiety: Secondary | ICD-10-CM | POA: Diagnosis not present

## 2024-09-26 ENCOUNTER — Telehealth: Payer: Self-pay | Admitting: Pain Medicine

## 2024-09-26 ENCOUNTER — Emergency Department
Admission: EM | Admit: 2024-09-26 | Discharge: 2024-09-26 | Disposition: A | Discharge status: Home / Self Care | Attending: Emergency Medicine | Admitting: Emergency Medicine

## 2024-09-26 ENCOUNTER — Ambulatory Visit: Admitting: Pain Medicine

## 2024-09-26 ENCOUNTER — Other Ambulatory Visit: Payer: Self-pay

## 2024-09-26 DIAGNOSIS — G43809 Other migraine, not intractable, without status migrainosus: Secondary | ICD-10-CM | POA: Insufficient documentation

## 2024-09-26 LAB — COMPREHENSIVE METABOLIC PANEL WITH GFR
ALT: 48 U/L — ABNORMAL HIGH (ref 0–44)
AST: 56 U/L — ABNORMAL HIGH (ref 15–41)
Albumin: 4.6 g/dL (ref 3.5–5.0)
Alkaline Phosphatase: 88 U/L (ref 38–126)
Anion gap: 16 — ABNORMAL HIGH (ref 5–15)
BUN: 11 mg/dL (ref 6–20)
CO2: 23 mmol/L (ref 22–32)
Calcium: 9.8 mg/dL (ref 8.9–10.3)
Chloride: 96 mmol/L — ABNORMAL LOW (ref 98–111)
Creatinine, Ser: 0.7 mg/dL (ref 0.44–1.00)
GFR, Estimated: >60 mL/min (ref >60)
Glucose, Bld: 91 mg/dL (ref 70–99)
Potassium: 4.2 mmol/L (ref 3.5–5.1)
Sodium: 135 mmol/L (ref 135–145)
Total Bilirubin: 0.4 mg/dL (ref 0.0–1.2)
Total Protein: 7.1 g/dL (ref 6.5–8.1)

## 2024-09-26 LAB — CBC WITH DIFFERENTIAL/PLATELET
Abs Immature Granulocytes: 0.05 K/uL (ref 0.00–0.07)
Basophils Absolute: 0 K/uL (ref 0.0–0.1)
Basophils Relative: 0 %
Eosinophils Absolute: 0 K/uL (ref 0.0–0.5)
Eosinophils Relative: 0 %
HCT: 39.2 % (ref 36.0–46.0)
Hemoglobin: 13.8 g/dL (ref 12.0–15.0)
Immature Granulocytes: 1 %
Lymphocytes Relative: 26 %
Lymphs Abs: 2.1 K/uL (ref 0.7–4.0)
MCH: 35.7 pg — ABNORMAL HIGH (ref 26.0–34.0)
MCHC: 35.2 g/dL (ref 30.0–36.0)
MCV: 101.3 fL — ABNORMAL HIGH (ref 80.0–100.0)
Monocytes Absolute: 0.5 K/uL (ref 0.1–1.0)
Monocytes Relative: 6 %
Neutro Abs: 5.3 K/uL (ref 1.7–7.7)
Neutrophils Relative %: 67 %
Platelets: 190 K/uL (ref 150–400)
RBC: 3.87 MIL/uL (ref 3.87–5.11)
RDW: 14.1 % (ref 11.5–15.5)
WBC: 8 K/uL (ref 4.0–10.5)
nRBC: 0 % (ref 0.0–0.2)

## 2024-09-26 MED ORDER — SODIUM CHLORIDE 0.9 % IV BOLUS
500.0000 mL | Freq: Once | INTRAVENOUS | Status: AC
  Administered 2024-09-26: 500 mL via INTRAVENOUS

## 2024-09-26 MED ORDER — DEXAMETHASONE SOD PHOSPHATE PF 10 MG/ML IJ SOLN
8.0000 mg | Freq: Once | INTRAMUSCULAR | Status: AC
  Administered 2024-09-26: 8 mg via INTRAVENOUS

## 2024-09-26 MED ORDER — DIPHENHYDRAMINE HCL 50 MG/ML IJ SOLN
25.0000 mg | Freq: Once | INTRAMUSCULAR | Status: AC
  Administered 2024-09-26: 25 mg via INTRAVENOUS
  Filled 2024-09-26: qty 1

## 2024-09-26 MED ORDER — KETOROLAC TROMETHAMINE 30 MG/ML IJ SOLN
30.0000 mg | Freq: Once | INTRAMUSCULAR | Status: AC
  Administered 2024-09-26: 30 mg via INTRAVENOUS
  Filled 2024-09-26: qty 1

## 2024-09-26 MED ORDER — METOCLOPRAMIDE HCL 5 MG/ML IJ SOLN
10.0000 mg | Freq: Once | INTRAMUSCULAR | Status: AC
  Administered 2024-09-26: 10 mg via INTRAVENOUS
  Filled 2024-09-26: qty 2

## 2024-09-26 NOTE — ED Provider Notes (Signed)
 [facility] Provider Note    Event Date/Time   First MD Initiated Contact with Patient 09/26/24 1138     (approximate)   History   Migraine   HPI  Miranda Fleming is a 58 y.o. female who reports history of thoracic back fracture with subsequent chronic pain.  She presents with complaints of pain radiating from her neck into the base of her skull which she describes as throbbing.  No weakness.  No fevers.  She does see pain management, last saw them 3 weeks ago     Physical Exam   Triage Vital Signs: ED Triage Vitals  Encounter Vitals Group     BP 09/26/24 1114 122/77     Girls Systolic BP Percentile --      Girls Diastolic BP Percentile --      Boys Systolic BP Percentile --      Boys Diastolic BP Percentile --      Pulse Rate 09/26/24 1114 (!) 108     Resp 09/26/24 1114 17     Temp 09/26/24 1114 98.4 F (36.9 C)     Temp src --      SpO2 09/26/24 1114 98 %     Weight 09/26/24 1115 49.9 kg (110 lb)     Height 09/26/24 1115 1.651 m (5' 5)     Head Circumference --      Peak Flow --      Pain Score 09/26/24 1114 10     Pain Loc --      Pain Education --      Exclude from Growth Chart --     Most recent vital signs: Vitals:   09/26/24 1114  BP: 122/77  Pulse: (!) 108  Resp: 17  Temp: 98.4 F (36.9 C)  SpO2: 98%     General: Awake, uncomfortable CV:  Good peripheral perfusion.  Resp:  Normal effort.  Abd:  No distention.  Other:  Normal strength in all extremities, PERRLA, EOMI   ED Results / Procedures / Treatments   Labs (all labs ordered are listed, but only abnormal results are displayed) Labs Reviewed  CBC WITH DIFFERENTIAL/PLATELET - Abnormal; Notable for the following components:      Result Value   MCV 101.3 (*)    MCH 35.7 (*)    All other components within normal limits  COMPREHENSIVE METABOLIC PANEL WITH GFR - Abnormal; Notable for the following components:   Chloride 96 (*)    AST 56 (*)    ALT 48  (*)    Anion gap 16 (*)    All other components within normal limits     EKG     RADIOLOGY     PROCEDURES:  Critical Care performed:   Procedures   MEDICATIONS ORDERED IN ED: Medications  ketorolac  (TORADOL ) 30 MG/ML injection 30 mg (30 mg Intravenous Given 09/26/24 1205)  diphenhydrAMINE  (BENADRYL ) injection 25 mg (25 mg Intravenous Given 09/26/24 1204)  metoCLOPramide  (REGLAN ) injection 10 mg (10 mg Intravenous Given 09/26/24 1206)  sodium chloride  0.9 % bolus 500 mL (0 mLs Intravenous Stopped 09/26/24 1237)  dexamethasone  (DECADRON ) injection 8 mg (8 mg Intravenous Given 09/26/24 1356)     IMPRESSION / MDM / ASSESSMENT AND PLAN / ED COURSE  I reviewed the triage vital signs and the nursing notes. Patient's presentation is most consistent with severe exacerbation of chronic illness.  Patient presents with pain as described above, this seems to be similar to episodes that she has  had in the past.  She is afebrile with reassuring labs, will treat with migraine cocktail, IV fluids and reevaluate.  ----------------------------------------- 2:50 PM on 09/26/2024 ----------------------------------------- Patient reports feeling significantly improved, she is ready for discharge, no indication for admission, return precautions discussed      FINAL CLINICAL IMPRESSION(S) / ED DIAGNOSES   Final diagnoses:  Other migraine without status migrainosus, not intractable     Rx / DC Orders   ED Discharge Orders     None        Note:  This document was prepared using Dragon voice recognition software and may include unintentional dictation errors.   Arlander Charleston, MD 09/26/24 1450

## 2024-09-26 NOTE — ED Triage Notes (Signed)
 Pt comes in via pov complaints of a migraine that started on Friday. Pt states that's she gets steroid injections in her upper left part of her shoulder near her spine for pain about 3 weeks ago. Pt has a prescription for Tramadol, but hasn't needed to take in in about a month. Pt complains of pain 10/10 at this time. Pt is alert and oriented x4.

## 2024-09-26 NOTE — Telephone Encounter (Signed)
 Patient states that she has pain in the left side of her head.  States the whole left side is numb and swollen and she has pain that shoots down the main artery in her neck causing a  bad headache.  Patient had cervical epidural procedure on 09-13-24 and states that she did very well with that.  States this is not the same kind of pain as prior to procedure. Patient states that she thinks she may need to go to the ED and I agreed with the patient.  Will send this to Dr Tanya sot hat he is aware.

## 2024-09-26 NOTE — Telephone Encounter (Signed)
 PT stated that left side of her head numb and feels like jello or water  down her neck she has pain and a bad headache. This has been happening since Friday. If she doesn't hear anything in about 30 minutes she's going to the ER.

## 2024-09-27 ENCOUNTER — Encounter: Payer: Self-pay | Admitting: *Deleted

## 2024-09-27 DIAGNOSIS — F4312 Post-traumatic stress disorder, chronic: Secondary | ICD-10-CM | POA: Diagnosis not present

## 2024-09-27 DIAGNOSIS — F4322 Adjustment disorder with anxiety: Secondary | ICD-10-CM | POA: Diagnosis not present

## 2024-09-29 ENCOUNTER — Ambulatory Visit

## 2024-09-29 ENCOUNTER — Encounter: Payer: Self-pay | Admitting: Cardiovascular Disease

## 2024-09-29 ENCOUNTER — Ambulatory Visit: Attending: Cardiovascular Disease | Admitting: Cardiovascular Disease

## 2024-09-29 VITALS — BP 111/73 | HR 87 | Ht 65.0 in | Wt 115.5 lb

## 2024-09-29 DIAGNOSIS — I951 Orthostatic hypotension: Secondary | ICD-10-CM

## 2024-09-29 DIAGNOSIS — R0602 Shortness of breath: Secondary | ICD-10-CM | POA: Diagnosis not present

## 2024-09-29 DIAGNOSIS — R002 Palpitations: Secondary | ICD-10-CM

## 2024-09-29 DIAGNOSIS — R55 Syncope and collapse: Secondary | ICD-10-CM

## 2024-09-29 NOTE — Patient Instructions (Signed)
 Medication Instructions:  No changes *If you need a refill on your cardiac medications before your next appointment, please call your pharmacy*  Lab Work: None ordered If you have labs (blood work) drawn today and your tests are completely normal, you will receive your results only by: MyChart Message (if you have MyChart) OR A paper copy in the mail If you have any lab test that is abnormal or we need to change your treatment, we will call you to review the results.  Testing/Procedures: Your physician has requested that you have an echocardiogram. Echocardiography is a painless test that uses sound waves to create images of your heart. It provides your doctor with information about the size and shape of your heart and how well your heart's chambers and valves are working.   You may receive an ultrasound enhancing agent through an IV if needed to better visualize your heart during the echo. This procedure takes approximately one hour.  There are no restrictions for this procedure.  This will take place at [address] [facility], [place] [address]  Please note: We ask at that you not bring children with you during ultrasound (echo/ vascular) testing. Due to room size and safety concerns, children are not allowed in the ultrasound rooms during exams. Our front office staff cannot provide observation of children in our lobby area while testing is being conducted. An adult accompanying a patient to their appointment will only be allowed in the ultrasound room at the discretion of the ultrasound technician under special circumstances. We apologize for any inconvenience.   Follow-Up: At [facility], you and your health needs are our priority.  As part of our continuing mission to provide you with exceptional heart care, our providers are all part of one team.  This team includes your primary Cardiologist (physician) and Advanced Practice Providers or APPs  (Physician Assistants and Nurse Practitioners) who all work together to provide you with the care you need, when you need it.  Your next appointment:   Follow up with APP after the echo  We recommend signing up for the patient portal called MyChart.  Sign up information is provided on this After Visit Summary.  MyChart is used to connect with patients for Virtual Visits (Telemedicine).  Patients are able to view lab/test results, encounter notes, upcoming appointments, etc.  Non-urgent messages can be sent to your provider as well.   To learn more about what you can do with MyChart, go to [email or web address].   Other Instructions ZIO XT- Long Term Monitor Instructions  Your physician has requested you wear a ZIO patch monitor for 14 days.  This is a single patch monitor. [employer] supplies one patch monitor per enrollment. Additional stickers are not available. Please do not apply patch if you will be having a Nuclear Stress Test, Echocardiogram, Cardiac CT, MRI, or Chest Xray during the period you would be wearing the monitor. The patch cannot be worn during these tests. You cannot remove and re-apply the ZIO XT patch monitor.  Your ZIO patch monitor will be mailed 3 day USPS to your address on file. It may take 3-5 days to receive your monitor after you have been enrolled. Once you have received your monitor, please review the enclosed instructions. Your monitor has already been registered assigning a specific monitor serial number to you.  Billing and Patient Assistance Program Information  We have supplied [employer] with any of your insurance information on file for billing purposes.  [employer] offers a sliding scale Patient Assistance Program for patients that do not have insurance, or whose insurance does not completely cover the cost of the ZIO monitor.  You must apply for the Patient Assistance Program to qualify for this discounted rate.  To apply, please call [employer] at [phone],  select option 4, select option 2, ask to apply for Patient Assistance Program. Meredeth will ask your household income, and how many people are in your household. They will quote your out-of-pocket cost based on that information. [employer] will also be able to set up a 43-month, interest-free payment plan if needed.  Applying the monitor   Shave hair from upper left chest.  Hold abrader disc by orange tab. Rub abrader in 40 strokes over the upper left chest as indicated in your monitor instructions.  Clean area with 4 enclosed alcohol pads. Let dry.  Apply patch as indicated in monitor instructions. Patch will be placed under collarbone on left side of chest with arrow pointing upward.  Rub patch adhesive wings for 2 minutes. Remove white label marked 1. Remove the white label marked 2. Rub patch adhesive wings for 2 additional minutes.  While looking in a mirror, press and release button in center of patch. A small green light will flash 3-4 times. This will be your only indicator that the monitor has been turned on.  Do not shower for the first 24 hours. You may shower after the first 24 hours.  Press the button if you feel a symptom. You will hear a small click. Record Date, Time and Symptom in the Patient Logbook.  When you are ready to remove the patch, follow instructions on the last 2 pages of Patient Logbook.  Stick patch monitor into the tabs at the bottom of the return box.  Place Patient Logbook in the blue and white box. Use locking tab on box and tape box closed securely. The blue and white box has prepaid postage on it. Please place it in the mailbox as soon as possible. Your physician should have your test results approximately 7-14 days after the monitor has been mailed back to [facility].  Call [facility] Customer Care at [phone] if you have questions regarding your ZIO XT patch monitor.  Call them immediately if you see an orange light blinking on your monitor.  If  your monitor falls off in less than 4 days, contact our Monitor department at [phone].  If your monitor becomes loose or falls off after 4 days call [employer] at [phone] for suggestions on securing your monitor.  Fast Heart Rate After Standing (Postural Orthostatic Tachycardia Syndrome): What to Know Postural orthostatic tachycardia syndrome (POTS) is a group of symptoms that happen when you stand up after lying down. It involves a fast heart rate. The symptoms get better when you lie back down. In some cases, POTS may be seen with other health problems. In other cases, it may happen on its own. What are the causes? The cause of POTS isn't known. What increases the risk? You may be more at risk if: You're female and: 81-33 years old. About to have your period. Pregnant. You take certain medicines. You've had a major injury. You have certain health problems, such as: An infection from a germ called a virus. An autoimmune disease. This happens when your body's defense system (immune system) attacks your body. A loss of red blood cells over time. A gene problem that causes your joints to be too mobile, such as Ehlers-Danlos  syndrome. A thyroid  that makes too much of the thyroid  hormone. Fibromyalgia. You've had surgery. You're often dehydrated. This means there's not enough water  in your body. What are the signs or symptoms? The most common symptom is feeling light-headed when you stand up from lying down or sitting. Other symptoms may include: A fast heart rate within 10 minutes of standing up. Uneven heartbeats. Feeling like you may faint or fainting. Feeling short of breath. Chest pain. Feeling like you may throw up or throwing up. You may also: Feel tired. Have muscle pain or a headache. Have memory problems. Have trouble falling asleep or staying asleep. Your symptoms may be worse in the morning. How is this diagnosed? You may be diagnosed based on you and your  family's health history. You may have an exam that includes: Checking your heart rate and blood pressure when you are: Lying down. Sitting. Standing. You may be diagnosed based on your health history and your family's health history. You may have an exam that includes: Checking your heart rate and blood pressure when: Lying down. Sitting. Standing. Blood tests. Pee tests. Tests to check for health problems that are seen with POTS. How is this treated? Treatment depends on how bad your symptoms are and if you have other health problems. You may need to: Treat other health problems. Drink two glasses of water  before getting up after you lie down. Take in more salt (sodium). Take medicines to: Control your blood pressure. Slow your heart rate. Stop taking certain medicines. Start an exercise program. Wear lower body pressure (compression) devices. Follow these instructions at home: Medicines Take your medicines only as told. Talk to your health care provider about: Any vitamins, herbs, or supplements you take. Starting any new medicines. You may need to stop or adjust some medicines if they cause POTS. Eating and drinking Drink more fluids as told. Take in more salt as told. General instructions Exercise as told. Do an aerobic exercise for 30 minutes a day, at least 4 days a week. Aerobic exercises are those that cause your heart to beat faster. Do not smoke, vape, or use nicotine or tobacco. Keep all follow-up visits. Your treatment plan may change over time. Where to find more information Standing Up to POTS: [email or web address] Contact a health care provider if: Your symptoms don't get better after treatment. Your symptoms get worse. You have new symptoms. You have fast or uneven heartbeats. You faint. Get help right away if: You have chest pain. You have trouble breathing. These symptoms may be an emergency. Call 911 right away. Do not wait to see if the symptoms  will go away. Do not drive yourself to the hospital. This information is not intended to replace advice given to you by your health care provider. Make sure you discuss any questions you have with your health care provider. Document Revised: 01/18/2024 Document Reviewed: 01/18/2024 Elsevier Patient Education  2025 [employer].

## 2024-09-29 NOTE — Progress Notes (Signed)
 Cardiology Office Note   Date:  09/29/2024   ID:  Miranda Fleming, Miranda Fleming 1966/10/30, MRN [id number]  PCP:  Myrna Camelia HERO, NP  Cardiologist:   Deatrice Cage, MD   Chief Complaint  Patient presents with   New Patient (Initial Visit)    Recurrent syncope; Tachycardia; Palpitations c/o migraine. Meds reviewed verbally with pt.      History of Present Illness: Miranda Fleming is a 58 y.o. female who was referred for evaluation of tachycardia, dizziness and syncope.  She has no chronic medical conditions such as hypertension, diabetes or hyperlipidemia.  She does have chronically low blood pressure. She is not a smoker and reports only occasional alcohol use.  However, she was hospitalized in March with symptoms of alcohol withdrawal after she was drinking too much wine.  She reports that it was a temporary situation when she was under stress and normally she does not drink much. She suffered from a cervical spine fracture in 2024 and has had significant chronic pain since then requiring frequent epidural injections.  Her mobility is very limited at the present time and she walks slowly with a walker. Over the last year, she experienced extreme dizziness especially when she is standing up with episodes of loss of consciousness associated with palpitations and tachycardia.  She denies any chest pain but does report shortness of breath with minimal exertion.   Past Medical History:  Diagnosis Date   DVT, lower extremity, distal, acute (HCC)    paired right peroneal veins; 2 weeks after back surgery   Palpitations 11/23/2015   SUI (stress urinary incontinence, female)    Wears glasses     Past Surgical History:  Procedure Laterality Date   ABDOMINAL HYSTERECTOMY  1995   W/ RIGHT SALPINGOOPHORECTOMY   EXPLORATORY LAPAROTOMY W/  LEFT SALPINGOOPHORECTOMY  01-25-2001   LAPAROSCOPY'S AND LAPAROTOMY'S FOR PERSISTANT OVARIAN CYST  X 5  1987;  1989;  1990;  1993;  1994    PUBOVAGINAL SLING N/A 10/31/2013   Procedure: LYNX PUBO-VAGINAL SLING;  Surgeon: Arlena LILLETTE Gal, MD;  Location: [facility];  Service: Urology;  Laterality: N/A;     Current Outpatient Medications  Medication Sig Dispense Refill   acetaminophen  (TYLENOL ) 500 MG tablet Take 500 mg by mouth 2 (two) times daily.     ALPRAZolam  (XANAX ) 1 MG tablet Take 1 mg by mouth every 8 (eight) hours as needed.     b complex vitamins capsule Take 1 capsule by mouth daily.     gabapentin  (NEURONTIN ) 300 MG capsule Take 1 capsule (300 mg total) by mouth 3 (three) times daily. 90 capsule 2   mirtazapine (REMERON) 15 MG tablet Take 15 mg by mouth at bedtime.     Multiple Vitamin (MULTIVITAMIN ADULT PO) Take by mouth daily.     traMADol (ULTRAM) 50 MG tablet Take 50 mg by mouth daily.     No current facility-administered medications for this visit.    Allergies:   Amoxicillin and Latex    Social History:  The patient  reports that she quit smoking about 2 years ago. Her smoking use included cigarettes. She started smoking about 43 years ago. She has a 61.6 pack-year smoking history. She has never used smokeless tobacco. She reports current alcohol use of about 4.0 standard drinks of alcohol per week.   Family History:  The patient's family history includes Atrial fibrillation in her mother; Breast cancer in her mother; Colon cancer in her paternal grandfather;  Diabetes in her maternal aunt; Healthy in her brother and father; Osteopenia in her sister; Osteoporosis in her mother; Ovarian cancer in her maternal grandmother and mother.    ROS:  Please see the history of present illness.   Otherwise, review of systems are positive for none.   All other systems are reviewed and negative.    PHYSICAL EXAM: VS:  BP 111/73 (BP Location: Right Arm, Cuff Size: Normal)   Pulse 87   Ht 5' 5 (1.651 m)   Wt 115 lb 8 oz (52.4 kg)   SpO2 98%   BMI 19.22 kg/m  , BMI Body mass index is 19.22  kg/m. GEN: Well nourished, well developed, in no acute distress  HEENT: normal  Neck: no JVD, carotid bruits, or masses Cardiac: RRR; no murmurs, rubs, or gallops,no edema  Respiratory:  clear to auscultation bilaterally, normal work of breathing GI: soft, nontender, nondistended, + BS MS: no deformity or atrophy  Skin: warm and dry, no rash Neuro:  Strength and sensation are intact Psych: euthymic mood, full affect   EKG:  EKG is ordered today. The ekg ordered today demonstrates : Normal sinus rhythm Normal ECG    Recent Labs: 01/09/2024: TSH 2.450 01/10/2024: Magnesium 1.8 09/26/2024: ALT 48; BUN 11; Creatinine, Ser 0.70; Hemoglobin 13.8; Platelets 190; Potassium 4.2; Sodium 135    Lipid Panel    Component Value Date/Time   CHOL 163 07/01/2012 1536   TRIG 98 07/01/2012 1536   HDL 74 07/01/2012 1536   CHOLHDL 2.2 07/01/2012 1536   VLDL 20 07/01/2012 1536   LDLCALC 69 07/01/2012 1536      Wt Readings from Last 3 Encounters:  09/29/24 115 lb 8 oz (52.4 kg)  09/26/24 110 lb (49.9 kg)  09/13/24 120 lb (54.4 kg)          09/29/2024    8:40 AM  PAD Screen  Previous PAD dx? No  Previous surgical procedure? No  Pain with walking? No  Feet/toe relief with dangling? No  Painful, non-healing ulcers? No  Extremities discolored? No      ASSESSMENT AND PLAN:  1.  Orthostatic hypotension: I suspect that her symptoms are due to orthostatic hypotension as there was significant drop in blood pressure with standing position with associated symptoms.  In addition, her heart rate increased from 89-107.  Systolic blood pressure dropped from 131-102.  I discussed with her the importance of increased hydration and salt intake.  Will provided her with instructions to help deal with this.  I do suspect that significant decrease in physical activities have contributed to dysfunctional autonomic regulation.  I discussed with her the importance of finding a way to exercise  regularly.  2.  Intermittent palpitations and tachycardia: I suspect sinus tachycardia but we have to rule out arrhythmia.  I requested a 2-week outpatient monitor.  3.  Significant shortness of breath with exertion: Likely due to physical deconditioning.  However will obtain an echocardiogram to evaluate her cardiac status.    Disposition:   FU after cardiac testing.  Signed,  Deatrice Cage, MD  09/29/2024 8:49 AM     [facility]

## 2024-09-30 NOTE — Telephone Encounter (Signed)
 Followed up with patient and she states she is still having this pain.  Informed her of what Dr Tanya had to say and she states she will go back to the ED and follow up.

## 2024-10-04 DIAGNOSIS — F4322 Adjustment disorder with anxiety: Secondary | ICD-10-CM | POA: Diagnosis not present

## 2024-10-05 ENCOUNTER — Ambulatory Visit: Attending: Pain Medicine | Admitting: Pain Medicine

## 2024-10-05 VITALS — BP 110/71 | HR 113 | Temp 98.2°F | Resp 16 | Ht 65.0 in | Wt 115.0 lb

## 2024-10-05 DIAGNOSIS — M4314 Spondylolisthesis, thoracic region: Secondary | ICD-10-CM | POA: Insufficient documentation

## 2024-10-05 DIAGNOSIS — M5412 Radiculopathy, cervical region: Secondary | ICD-10-CM | POA: Insufficient documentation

## 2024-10-05 DIAGNOSIS — G4486 Cervicogenic headache: Secondary | ICD-10-CM | POA: Insufficient documentation

## 2024-10-05 DIAGNOSIS — Z09 Encounter for follow-up examination after completed treatment for conditions other than malignant neoplasm: Secondary | ICD-10-CM | POA: Insufficient documentation

## 2024-10-05 DIAGNOSIS — M542 Cervicalgia: Secondary | ICD-10-CM | POA: Insufficient documentation

## 2024-10-05 DIAGNOSIS — M546 Pain in thoracic spine: Secondary | ICD-10-CM | POA: Insufficient documentation

## 2024-10-05 DIAGNOSIS — R52 Pain, unspecified: Secondary | ICD-10-CM | POA: Diagnosis present

## 2024-10-05 DIAGNOSIS — G8929 Other chronic pain: Secondary | ICD-10-CM | POA: Insufficient documentation

## 2024-10-05 DIAGNOSIS — M47814 Spondylosis without myelopathy or radiculopathy, thoracic region: Secondary | ICD-10-CM | POA: Insufficient documentation

## 2024-10-05 DIAGNOSIS — M5489 Other dorsalgia: Secondary | ICD-10-CM | POA: Insufficient documentation

## 2024-10-05 DIAGNOSIS — M4854XS Collapsed vertebra, not elsewhere classified, thoracic region, sequela of fracture: Secondary | ICD-10-CM | POA: Insufficient documentation

## 2024-10-05 DIAGNOSIS — S22000S Wedge compression fracture of unspecified thoracic vertebra, sequela: Secondary | ICD-10-CM | POA: Insufficient documentation

## 2024-10-05 NOTE — Progress Notes (Signed)
 PROVIDER NOTE: Interpretation of information contained herein should be left to medically-trained personnel. Specific patient instructions are provided elsewhere under Patient Instructions section of medical record. This document was created in part using AI and STT-dictation technology, any transcriptional errors that may result from this process are unintentional.  Patient: Miranda Fleming  Service: E/M   PCP: Myrna Camelia HERO, NP  DOB: 1966/09/09  DOS: 10/05/2024  Provider: Eric DELENA Como, MD  MRN: [id number]  Delivery: Face-to-face  Specialty: Interventional Pain Management  Type: Established Patient  Setting: Ambulatory outpatient facility  Specialty designation: 09  Referring Prov.: No ref. provider found  Location: Outpatient office facility       History of present illness (HPI) [name], a 58 y.o. year old female, is here today because of her Left cervical radiculopathy [M54.12]. [name] primary complain today is Neck Pain  Pertinent problems: [name] has Non-traumatic compression fracture of T1 thoracic vertebra, sequela; Pain in right knee; Carpal tunnel syndrome of right wrist; Median neuropathy; Migraine; Neuropathy; Right radial nerve palsy; Ulnar nerve palsy; Chronic pain syndrome; Dupuytren's disease of palm of hands (Bilateral); Chronic thoracic back pain (1ry area of Pain) (Midline); Thoracic spine pain; Osteopenia determined by x-ray; Left cervical radiculopathy; Right cervical radiculopathy; Cervicalgia; Obstruction of right vertebral artery; Vertebral artery stenosis (developmental) (Right); Ulnar neuritis, left; Arthralgia of both knees; Other spondylosis, thoracic region; Abnormal MRI, thoracic spine (02/16/2024); Abnormal MRI, cervical spine (02/16/2024 & 03/23/2024); Non-traumatic compression fracture of T2 thoracic vertebra, sequela; Non-traumatic compression fracture of T3 thoracic vertebra, sequela; Non-traumatic compression fracture of T4  thoracic vertebra, sequela; Compression of thoracic vertebra, sequela (T1, T2, T3, T4); DDD (degenerative disc disease), thoracic; Thoracic vertebrogenic pain; Thoracic facet arthropathy; Thoracic facet joint pain; Grade 1 Anterolisthesis of thoracic spine (T2-3); Grade 1 Anterolisthesis of cervical spine (C7-T1); DDD (degenerative disc disease), cervical; Cervical spondylosis; Compression fracture of thoracic vertebra with routine healing; Lumbar spine pain; Cervicogenic headache (Bilateral); Cervical facet joint pain (Multilevel) (Bilateral); Cervical facet joint arthropathy; Cervical facet syndrome; Spondylosis without myelopathy or radiculopathy, cervical region; Burning pain (cervical) (Left); Neurogenic pain (cervical) (Bilateral) (L>R); Neck pain; Compression fracture of thoracic vertebra (HCC); Arthralgia of left knee; Arthralgia of right knee; and Chronic thoracic back pain (Bilateral) on their pertinent problem list.  Pain Assessment: Severity of Chronic pain is reported as a 0-No pain/10. Location: Neck Lower, Right, Left/top of shoulders bilateral and between shoulders blades. Onset: More than a month ago. Quality: Aching, Discomfort, Numbness, Burning. Timing:  . Modifying factor(s): procedures. Vitals:  height is 5' 5 (1.651 m) and weight is 115 lb (52.2 kg). Her temperature is 98.2 F (36.8 C). Her blood pressure is 110/71 and her pulse is 113 (abnormal). Her respiration is 16 and oxygen saturation is 100%.  BMI: Estimated body mass index is 19.14 kg/m as calculated from the following:   Height as of this encounter: 5' 5 (1.651 m).   Weight as of this encounter: 115 lb (52.2 kg).  Last encounter: 09/12/2024. Last procedure: 09/13/2024.  Reason for encounter: post-procedure evaluation and assessment.   Discussed the use of AI scribe software for clinical note transcription with the patient, who gave verbal consent to proceed.  History of Present Illness   Miranda Fleming is a  58 year old female who presents for follow-up after her second left-sided C7-T1 cervical epidural steroid injection. She was referred by a vascular specialist, Dr. Marea, who found no vascular cause for her symptoms.  She had complete relief  of neck pain during the local anesthetic phase, now with about 98% overall improvement. Swelling in the neck recurs with movement.  She has unpredictable dizziness with falls, including two falls a week before her last injections. She uses a walker for stability when dizzy. CT showed one small vertebral artery with adequate compensation from the other side. She is undergoing a 14-day Holter monitor to evaluate for arrhythmia as a cause of dizziness.  She has severe mid thoracic pain between the shoulder blades, rated 8 to 10 out of 10, nonradiating to the front and distinct from the improved neck pain.      Post-Procedure Evaluation   Type: Cervical Epidural Steroid injection (CESI) (Interlaminar) #2  Laterality: Left (-LT)  Level: C7-T1 DOS: 09/13/2024  Provider: Eric DELENA Como, MD Imaging: Fluoroscopy-guided Spinal ([id number]) Anesthesia: Local anesthesia (1-2% Lidocaine ) Anxiolysis: IV Versed  2.0 mg Sedation: Minimal Sedation None required. No Fentanyl  administered.          Medical Necessity Purpose: Diagnostic/Therapeutic Rationale (medical necessity): procedure needed and proper for the diagnosis and/or treatment of [name]'s medical symptoms and needs. Indications: Cervicalgia, cervical radicular pain, degenerative disc disease, severe enough to impact quality of life or function. 1. Left cervical radiculopathy   2. Cervicalgia   3. Burning pain (cervical) (Left)   4. Compression of thoracic vertebra, sequela (T1, T2, T3, T4)   5. DDD (degenerative disc disease), cervical   6. Grade 1 Anterolisthesis of cervical spine (C7-T1)   7. Non-traumatic compression fracture of T1 thoracic vertebra, sequela    NAS-11 Pain score:    Pre-procedure: 5 /10   Post-procedure: 0-No pain/10     Effectiveness:  Initial hour after procedure: 100 %. Subsequent 4-6 hours post-procedure: 100 %. Analgesia past initial 6 hours: 98 % (current). Ongoing improvement:  Analgesic: The patient indicates having attained 100% relief of the pain for the duration of local anesthetic followed by an ongoing 98% improvement of her symptoms.  She has regained almost complete range of motion of the cervical spine and she is very happy with the results does seem to be ongoing. Function: [name] reports improvement in function ROM: [name] reports improvement in ROM  Pharmacotherapy Assessment   Opioid Analgesic: No chronic opioid analgesics therapy prescribed by our practice. Tramadol 50 mg tablet, 1 tab p.o. daily (# 30) (30/month) (last filled on 11/28/2023) MME/day: 10 mg/day   Monitoring: [place] PMP: PDMP reviewed during this encounter.       Pharmacotherapy: No side-effects or adverse reactions reported. Compliance: No problems identified. Effectiveness: Clinically acceptable.  Dorlene Montie FALCON, RN  10/05/2024  9:54 AM  Sign when Signing Visit Safety precautions to be maintained throughout the outpatient stay will include: orient to surroundings, keep bed in low position, maintain call bell within reach at all times, provide assistance with transfer out of bed and ambulation.     UDS:  Summary  Date Value Ref Range Status  12/07/2023 FINAL  Final    Comment:    ==================================================================== Compliance Drug Analysis, Ur ==================================================================== Specimen Alert Not Detected result may be consistent with the time of last use noted for this medication. AS NEEDED (Tramadol) ==================================================================== Test                             Result       Flag       Units  Drug Present and Declared for  Prescription Verification   Alprazolam   419          EXPECTED   ng/mg creat   Alpha-hydroxyalprazolam        758          EXPECTED   ng/mg creat    Source of alprazolam  is a scheduled prescription medication. Alpha-    hydroxyalprazolam is an expected metabolite of alprazolam .  Drug Present not Declared for Prescription Verification   Ibuprofen                       PRESENT      UNEXPECTED   Diphenhydramine                 PRESENT      UNEXPECTED  Drug Absent but Declared for Prescription Verification   Tramadol                       Not Detected UNEXPECTED ng/mg creat ==================================================================== Test                      Result    Flag   Units      Ref Range   Creatinine              31               mg/dL      >=79 ==================================================================== Declared Medications:  The flagging and interpretation on this report are based on the  following declared medications.  Unexpected results may arise from  inaccuracies in the declared medications.   **Note: The testing scope of this panel includes these medications:   Alprazolam  (Xanax )  Tramadol (Ultram)   **Note: The testing scope of this panel does not include the  following reported medications:   Calcium  Estradiol  (Estrace )  Fish Oil  Multivitamin  Supplement  Vitamin B12 ==================================================================== For clinical consultation, please call [phone]. ====================================================================     No results found for: CBDTHCR No results found for: D8THCCBX No results found for: D9THCCBX  ROS  Constitutional: Denies any fever or chills Gastrointestinal: No reported hemesis, hematochezia, vomiting, or acute GI distress Musculoskeletal: Denies any acute onset joint swelling, redness, loss of ROM, or weakness Neurological: No reported episodes of acute  onset apraxia, aphasia, dysarthria, agnosia, amnesia, paralysis, loss of coordination, or loss of consciousness  Medication Review  ALPRAZolam , Multiple Vitamin, acetaminophen , b complex vitamins, gabapentin , mirtazapine, and traMADol  History Review  Allergy: [name] is allergic to amoxicillin and latex. Drug: [name]  has no history on file for drug use. Alcohol:  reports current alcohol use of about 4.0 standard drinks of alcohol per week. Tobacco:  reports that she quit smoking about 2 years ago. Her smoking use included cigarettes. She started smoking about 43 years ago. She has a 61.6 pack-year smoking history. She has never used smokeless tobacco. Social: [name]  reports that she quit smoking about 2 years ago. Her smoking use included cigarettes. She started smoking about 43 years ago. She has a 61.6 pack-year smoking history. She has never used smokeless tobacco. She reports current alcohol use of about 4.0 standard drinks of alcohol per week. Medical:  has a past medical history of DVT, lower extremity, distal, acute (HCC), Palpitations (11/23/2015), SUI (stress urinary incontinence, female), and Wears glasses. Surgical: [name]  has a past surgical history that includes EXPLORATORY LAPAROTOMY W/  LEFT SALPINGOOPHORECTOMY (01-25-2001); Abdominal hysterectomy (1995); LAPAROSCOPY'S AND LAPAROTOMY'S FOR PERSISTANT OVARIAN CYST (X  5  1987;  1989;  1990;  1993;  1994); and Pubovaginal sling (N/A, 10/31/2013). Family: family history includes Atrial fibrillation in her mother; Breast cancer in her mother; Colon cancer in her paternal grandfather; Diabetes in her maternal aunt; Healthy in her brother and father; Osteopenia in her sister; Osteoporosis in her mother; Ovarian cancer in her maternal grandmother and mother.  Laboratory Chemistry Profile   Renal Lab Results  Component Value Date   BUN 11 09/26/2024   CREATININE 0.70 09/26/2024   BCR 19 12/07/2023   GFRAA >60  12/18/2017   GFRNONAA >60 09/26/2024    Hepatic Lab Results  Component Value Date   AST 56 (H) 09/26/2024   ALT 48 (H) 09/26/2024   ALBUMIN 4.6 09/26/2024   ALKPHOS 88 09/26/2024   LIPASE 35 05/24/2021   AMMONIA <10 01/09/2024    Electrolytes Lab Results  Component Value Date   NA 135 09/26/2024   K 4.2 09/26/2024   CL 96 (L) 09/26/2024   CALCIUM 9.8 09/26/2024   MG 1.8 01/10/2024    Bone Lab Results  Component Value Date   VD25OH 50.8 12/18/2017   25OHVITD1 68 12/07/2023   25OHVITD2 41 12/07/2023   25OHVITD3 27 12/07/2023    Inflammation (CRP: Acute Phase) (ESR: Chronic Phase) Lab Results  Component Value Date   CRP <1 12/07/2023   ESRSEDRATE 3 12/07/2023   LATICACIDVEN 1.4 01/09/2024         Note: Above Lab results reviewed.  Recent Imaging Review  DG PAIN CLINIC C-ARM 1-60 MIN NO REPORT Fluoro was used, but no Radiologist interpretation will be provided.  Please refer to NOTES tab for provider progress note. Note: Reviewed        Physical Exam  Vitals: BP 110/71   Pulse (!) 113   Temp 98.2 F (36.8 C)   Resp 16   Ht 5' 5 (1.651 m)   Wt 115 lb (52.2 kg)   SpO2 100%   BMI 19.14 kg/m  BMI: Estimated body mass index is 19.14 kg/m as calculated from the following:   Height as of this encounter: 5' 5 (1.651 m).   Weight as of this encounter: 115 lb (52.2 kg). Ideal: Ideal body weight: 57 kg (125 lb 10.6 oz) General appearance: Well nourished, well developed, and well hydrated. In no apparent acute distress Mental status: Alert, oriented x 3 (person, place, & time)       Respiratory: No evidence of acute respiratory distress Eyes: PERLA Physical Exam   NECK: Improved range of motion in the neck.       Assessment   Diagnosis Status  1. Left cervical radiculopathy   2. Cervicalgia   3. Chronic thoracic back pain (Bilateral)   4. Burning pain (cervical) (Left)   5. Cervicogenic headache (Bilateral)   6. Postop check   7. Chronic thoracic back  pain (1ry area of Pain) (Midline)   8. Thoracic facet arthropathy   9. Thoracic facet joint pain   10. Compression of thoracic vertebra, sequela (T1, T2, T3, T4)   11. Grade 1 Anterolisthesis of thoracic spine (T2-3)   12. Non-traumatic compression fracture of T4 thoracic vertebra, sequela   13. Non-traumatic compression fracture of T3 thoracic vertebra, sequela   14. Non-traumatic compression fracture of T2 thoracic vertebra, sequela   15. Non-traumatic compression fracture of T1 thoracic vertebra, sequela   16. Thoracic spine pain   17. Thoracic vertebrogenic pain    Resolved Resolved Recurring   Updated Problems: Problem  Chronic thoracic back pain (Bilateral)  Vertebral artery stenosis (developmental) (Right)    Plan of Care  Problem-specific:  Assessment and Plan    Cervical radiculopathy and cervicalgia status post epidural steroid injection   Her cervical radiculopathy and cervicalgia have significantly improved following the second left-sided C71 cervical epidural steroid injection. She experienced 100% relief during the local anesthetic duration and continues to have 98% improvement. Swelling may recur, potentially with movement. Early intervention is advised if symptoms return to minimize the need for multiple injections. She should avoid overexertion and maintain proper neck alignment, especially when watching TV.  Thoracic facet joint pain   She reports significant pain between her shoulder blades, rated 8 to 10 daily, without radiation to the front. Thoracic facet joint pain is suspected. A diagnostic injection is scheduled to assess thoracic facet joint involvement, and an x-ray-guided joint block will be performed to confirm the diagnosis.       [name] has a current medication list which includes the following long-term medication(s): gabapentin  and mirtazapine.  Pharmacotherapy (Medications Ordered): No orders of the defined types were placed in  this encounter.  Orders:  Orders Placed This Encounter  Procedures   THORACIC FACET BLOCK    Standing Status:   Future    Expiration Date:   01/05/2025    Scheduling Instructions:     Thoracic Medial Branch Block     Side: Bilateral     Sedation: With Sedation.     Timeframe: ASAA    Where will this procedure be performed?:   [facility]   Nursing Instructions:    Please complete this patient's postprocedure evaluation.    Scheduling Instructions:     Please complete this patient's postprocedure evaluation.     Interventional Therapies  Risk Factors  Considerations  Medical Comorbidities:  Allergy: LATEX  Hx. DVT  paroxysmal dizziness     Planned  Pending:   Diagnostic bilateral thoracic facet MBB #1 (with fluoroscopic mapping)   Under consideration:   Diagnostic bilateral thoracic facet MBB #1    Completed:   Diagnostic left cervical ESI x2 (09/13/2024) (100/100/98/98)  Diagnostic/therapeutic bilateral cervical facet (TON-C6) MBB x1 (05/24/2024) (100/100/0/0)  Diagnostic/therapeutic midline (T2-3) thoracic ESI x1 (12/24/2023) (100/100/5/RUE:100LUE:90UB:5-10)  Therapeutic left (T2-3) thoracic ESI x1 (03/10/2024) (100/100/90x36hrs/45)  Therapeutic left (T3-4) thoracic ESI x1 (04/07/2024) (DNKFU-Returned 05/11/2024) (0/0/0/0)    Therapeutic  Palliative (PRN) options:   Therapeutic/palliative left cervical ESI #3    Completed by other providers:   None reported     Return for (ECT):(B) T-FCT Blk #1.    Recent Visits Date Type Provider Dept  09/13/24 Procedure visit Tanya Glisson, MD [facility]  09/12/24 Office Visit Tanya Glisson, MD [facility]  08/23/24 Procedure visit Tanya Glisson, MD [facility]  08/22/24 Office Visit Tanya Glisson, MD [facility]  Showing recent visits within past 90 days and meeting all other requirements Today's Visits Date Type Provider Dept  10/05/24 Office Visit  Tanya Glisson, MD [facility]  Showing today's visits and meeting all other requirements Future Appointments No visits were found meeting these conditions. Showing future appointments within next 90 days and meeting all other requirements  I discussed the assessment and treatment plan with the patient. The patient was provided an opportunity to ask questions and all were answered. The patient agreed with the plan and demonstrated an understanding of the instructions.  Patient advised to call back or seek an in-person evaluation if the  symptoms or condition worsens.  Duration of encounter: 35 minutes.  Total time on encounter, as per AMA guidelines included both the face-to-face and non-face-to-face time personally spent by the physician and/or other qualified health care professional(s) on the day of the encounter (includes time in activities that require the physician or other qualified health care professional and does not include time in activities normally performed by clinical staff). Physician's time may include the following activities when performed: Preparing to see the patient (e.g., pre-charting review of records, searching for previously ordered imaging, lab work, and nerve conduction tests) Review of prior analgesic pharmacotherapies. Reviewing PMP Interpreting ordered tests (e.g., lab work, imaging, nerve conduction tests) Performing post-procedure evaluations, including interpretation of diagnostic procedures Obtaining and/or reviewing separately obtained history Performing a medically appropriate examination and/or evaluation Counseling and educating the patient/family/caregiver Ordering medications, tests, or procedures Referring and communicating with other health care professionals (when not separately reported) Documenting clinical information in the electronic or other health record Independently interpreting results (not separately reported) and  communicating results to the patient/ family/caregiver Care coordination (not separately reported)  Note by: Eric DELENA Como, MD (TTS and AI technology used. I apologize for any typographical errors that were not detected and corrected.) Date: 10/05/2024; Time: 10:47 AM

## 2024-10-05 NOTE — Patient Instructions (Signed)
 ______________________________________________________________________    Procedure instructions  Stop blood-thinners  Do not eat or drink fluids (other than water ) for 6 hours before your procedure  No water  for 2 hours before your procedure  Take your blood pressure medicine with a sip of water   Arrive 30 minutes before your appointment  If sedation is planned, bring suitable driver. [place], [place], & public transportation are NOT APPROVED)  Carefully read the Preparing for your procedure detailed instructions  If you have questions call us  at [phone]  Procedure appointments are for procedures only.   NO medication refills or new problem evaluations will be done on procedure days.   Only the scheduled, pre-approved procedure and side will be done.   ______________________________________________________________________     ______________________________________________________________________    Preparing for your procedure  Appointments: If you think you may not be able to keep your appointment, call 24-48 hours in advance to cancel. We need time to make it available to others.  Procedure visits are for procedures only. During your procedure appointment there will be: NO Prescription Refills*. NO medication changes or discussions*. NO discussion of disability issues*. NO unrelated pain problem evaluations*. NO evaluations to order other pain procedures*. *These will be addressed at a separate and distinct evaluation encounter on the provider's evaluation schedule and not during procedure days.  Instructions: Food intake: Avoid eating anything solid for at least 8 hours prior to your procedure. Clear liquid intake: You may take clear liquids such as water  up to 2 hours prior to your procedure. (No carbonated drinks. No soda.) Transportation: Unless otherwise stated by your physician, bring a driver. (Driver cannot be a Market researcher, Pharmacist, community, or any other form of public  transportation.) Morning Medicines: Except for blood thinners, take all of your other morning medications with a sip of water . Make sure to take your heart and blood pressure medicines. If your blood pressure's lower number is above 100, the case will be rescheduled. Blood thinners: Make sure to stop your blood thinners as instructed.  If you take a blood thinner, but were not instructed to stop it, call our office [phone] and ask to talk to a nurse. Not stopping a blood thinner prior to certain procedures could lead to serious complications. Diabetics on insulin : Notify the staff so that you can be scheduled 1st case in the morning. If your diabetes requires high dose insulin , take only  of your normal insulin  dose the morning of the procedure and notify the staff that you have done so. Preventing infections: Shower with an antibacterial soap the morning of your procedure.  Build-up your immune system: Take 1000 mg of Vitamin C with every meal (3 times a day) the day prior to your procedure. Antibiotics: Inform the nursing staff if you are taking any antibiotics or if you have any conditions that may require antibiotics prior to procedures. (Example: recent joint implants)   Pregnancy: If you are pregnant make sure to notify the nursing staff. Not doing so may result in injury to the fetus, including death.  Sickness: If you have a cold, fever, or any active infections, call and cancel or reschedule your procedure. Receiving steroids while having an infection may result in complications. Arrival: You must be in the facility at least 30 minutes prior to your scheduled procedure. Tardiness: Your scheduled time is also the cutoff time. If you do not arrive at least 15 minutes prior to your procedure, you will be rescheduled.  Children: Do not bring any children with  you. Make arrangements to keep them home. Dress appropriately: There is always a possibility that your clothing may get soiled. Avoid  long dresses. Valuables: Do not bring any jewelry or valuables.  Reasons to call and reschedule or cancel your procedure: (Following these recommendations will minimize the risk of a serious complication.) Surgeries: Avoid having procedures within 2 weeks of any surgery. (Avoid for 2 weeks before or after any surgery). Flu Shots: Avoid having procedures within 2 weeks of a flu shots or . (Avoid for 2 weeks before or after immunizations). Barium: Avoid having a procedure within 7-10 days after having had a radiological study involving the use of radiological contrast. (Myelograms, Barium swallow or enema study). Heart attacks: Avoid any elective procedures or surgeries for the initial 6 months after a Myocardial Infarction (Heart Attack). Blood thinners: It is imperative that you stop these medications before procedures. Let us  know if you if you take any blood thinner.  Infection: Avoid procedures during or within two weeks of an infection (including chest colds or gastrointestinal problems). Symptoms associated with infections include: Localized redness, fever, chills, night sweats or profuse sweating, burning sensation when voiding, cough, congestion, stuffiness, runny nose, sore throat, diarrhea, nausea, vomiting, cold or Flu symptoms, recent or current infections. It is specially important if the infection is over the area that we intend to treat. Heart and lung problems: Symptoms that may suggest an active cardiopulmonary problem include: cough, chest pain, breathing difficulties or shortness of breath, dizziness, ankle swelling, uncontrolled high or unusually low blood pressure, and/or palpitations. If you are experiencing any of these symptoms, cancel your procedure and contact your primary care physician for an evaluation.  Remember:  Regular Business hours are:  Monday to Thursday 8:00 AM to 4:00 PM  Provider's Schedule: Eric Como, MD:  Procedure days: Tuesday and Thursday 7:30  AM to 4:00 PM  Wallie Sherry, MD:  Procedure days: Monday and Wednesday 7:30 AM to 4:00 PM Last  Updated: 10/20/2023 ______________________________________________________________________     ______________________________________________________________________    General Risks and Possible Complications  Patient Responsibilities: It is important that you read this as it is part of your informed consent. It is our duty to inform you of the risks and possible complications associated with treatments offered to you. It is your responsibility as a patient to read this and to ask questions about anything that is not clear or that you believe was not covered in this document.  Patient's Rights: You have the right to refuse treatment. You also have the right to change your mind, even after initially having agreed to have the treatment done. However, under this last option, if you wait until the last second to change your mind, you may be charged for the materials used up to that point.  Introduction: Medicine is not an Visual merchandiser. Everything in Medicine, including the lack of treatment(s), carries the potential for danger, harm, or loss (which is by definition: Risk). In Medicine, a complication is a secondary problem, condition, or disease that can aggravate an already existing one. All treatments carry the risk of possible complications. The fact that a side effects or complications occurs, does not imply that the treatment was conducted incorrectly. It must be clearly understood that these can happen even when everything is done following the highest safety standards.  No treatment: You can choose not to proceed with the proposed treatment alternative. The "PRO(s)" would include: avoiding the risk of complications associated with the therapy. The "CON(s)" would include:  not getting any of the treatment benefits. These benefits fall under one of three categories: diagnostic; therapeutic; and/or  palliative. Diagnostic benefits include: getting information which can ultimately lead to improvement of the disease or symptom(s). Therapeutic benefits are those associated with the successful treatment of the disease. Finally, palliative benefits are those related to the decrease of the primary symptoms, without necessarily curing the condition (example: decreasing the pain from a flare-up of a chronic condition, such as incurable terminal cancer).  General Risks and Complications: These are associated to most interventional treatments. They can occur alone, or in combination. They fall under one of the following six (6) categories: no benefit or worsening of symptoms; bleeding; infection; nerve damage; allergic reactions; and/or death. No benefits or worsening of symptoms: In Medicine there are no guarantees, only probabilities. No healthcare provider can ever guarantee that a medical treatment will work, they can only state the probability that it may. Furthermore, there is always the possibility that the condition may worsen, either directly, or indirectly, as a consequence of the treatment. Bleeding: This is more common if the patient is taking a blood thinner, either prescription or over the counter (example: Goody Powders, Fish oil, Aspirin, Garlic, etc.), or if suffering a condition associated with impaired coagulation (example: Hemophilia, cirrhosis of the liver, low platelet counts, etc.). However, even if you do not have one on these, it can still happen. If you have any of these conditions, or take one of these drugs, make sure to notify your treating physician. Infection: This is more common in patients with a compromised immune system, either due to disease (example: diabetes, cancer, human immunodeficiency virus [HIV], etc.), or due to medications or treatments (example: therapies used to treat cancer and rheumatological diseases). However, even if you do not have one on these, it can still  happen. If you have any of these conditions, or take one of these drugs, make sure to notify your treating physician. Nerve Damage: This is more common when the treatment is an invasive one, but it can also happen with the use of medications, such as those used in the treatment of cancer. The damage can occur to small secondary nerves, or to large primary ones, such as those in the spinal cord and brain. This damage may be temporary or permanent and it may lead to impairments that can range from temporary numbness to permanent paralysis and/or brain death. Allergic Reactions: Any time a substance or material comes in contact with our body, there is the possibility of an allergic reaction. These can range from a mild skin rash (contact dermatitis) to a severe systemic reaction (anaphylactic reaction), which can result in death. Death: In general, any medical intervention can result in death, most of the time due to an unforeseen complication. ______________________________________________________________________      ______________________________________________________________________    Steroid injections  Common steroids for injections Triamcinolone: Used by many sports medicine physicians for large joint and bursal injections, often combined with a local anesthetic like lidocaine . A study focusing on coccydynia (tailbone pain) found triamcinolone was more effective than betamethasone , suggesting it may also be preferable for other localized inflammation conditions. Methylprednisolone: A common alternative to triamcinolone that is also a strong anti-inflammatory. It is available in different formulations, with the acetate suspension being the long-acting option for intra-articular injections. Dexamethasone : This is a non-particulate steroid, meaning it has a lower risk of tissue damage compared to particulate steroids like triamcinolone and methylprednisolone. While less common for this specific  use,  it is an option for targeted injections.   Considerations for physicians Particulate vs. non-particulate steroids: Triamcinolone and methylprednisolone are particulate, meaning they can clump together. Dexamethasone  is non-particulate. Particulate steroids are often preferred for their longer-lasting effects but carry a theoretical higher risk for certain injections (though this is less of a concern in the costochondral joints). Combined injectate: Corticosteroids are typically mixed with a local anesthetic like lidocaine  to provide both immediate pain relief (from the anesthetic) and longer-term inflammation reduction (from the steroid). Imaging guidance: To ensure accurate placement of the needle and medication, physicians may use ultrasound or fluoroscopic guidance for the injection, especially in complex or refractory cases.   Patient guidance Before undergoing a steroid injection, discuss the options with your physician. They will determine the best steroid, dosage, and procedure for your specific case based on factors like: Severity of your condition History of response to other treatments Your overall health status Experience and preference of the physician  Last  Updated: 07/05/2024 ______________________________________________________________________

## 2024-10-05 NOTE — Progress Notes (Signed)
 Safety precautions to be maintained throughout the outpatient stay will include: orient to surroundings, keep bed in low position, maintain call bell within reach at all times, provide assistance with transfer out of bed and ambulation.

## 2024-10-11 ENCOUNTER — Ambulatory Visit (HOSPITAL_BASED_OUTPATIENT_CLINIC_OR_DEPARTMENT_OTHER): Admitting: Pain Medicine

## 2024-10-11 ENCOUNTER — Encounter: Payer: Self-pay | Admitting: Pain Medicine

## 2024-10-11 ENCOUNTER — Ambulatory Visit
Admission: RE | Admit: 2024-10-11 | Discharge: 2024-10-11 | Disposition: A | Source: Ambulatory Visit | Attending: Pain Medicine | Admitting: Pain Medicine

## 2024-10-11 VITALS — BP 115/58 | HR 84 | Temp 96.8°F | Resp 15 | Ht 65.0 in | Wt 125.0 lb

## 2024-10-11 DIAGNOSIS — M5489 Other dorsalgia: Secondary | ICD-10-CM

## 2024-10-11 DIAGNOSIS — M4314 Spondylolisthesis, thoracic region: Secondary | ICD-10-CM

## 2024-10-11 DIAGNOSIS — Z91040 Latex allergy status: Secondary | ICD-10-CM | POA: Insufficient documentation

## 2024-10-11 DIAGNOSIS — M4854XS Collapsed vertebra, not elsewhere classified, thoracic region, sequela of fracture: Secondary | ICD-10-CM | POA: Insufficient documentation

## 2024-10-11 DIAGNOSIS — M47814 Spondylosis without myelopathy or radiculopathy, thoracic region: Secondary | ICD-10-CM | POA: Insufficient documentation

## 2024-10-11 DIAGNOSIS — M5134 Other intervertebral disc degeneration, thoracic region: Secondary | ICD-10-CM

## 2024-10-11 DIAGNOSIS — S22000S Wedge compression fracture of unspecified thoracic vertebra, sequela: Secondary | ICD-10-CM

## 2024-10-11 DIAGNOSIS — G8929 Other chronic pain: Secondary | ICD-10-CM | POA: Insufficient documentation

## 2024-10-11 DIAGNOSIS — M546 Pain in thoracic spine: Secondary | ICD-10-CM | POA: Diagnosis not present

## 2024-10-11 MED ORDER — LIDOCAINE HCL 2 % IJ SOLN
20.0000 mL | Freq: Once | INTRAMUSCULAR | Status: AC
  Administered 2024-10-11: 400 mg

## 2024-10-11 MED ORDER — FENTANYL CITRATE (PF) 100 MCG/2ML IJ SOLN
25.0000 ug | INTRAMUSCULAR | Status: DC | PRN
  Administered 2024-10-11: 50 ug via INTRAVENOUS

## 2024-10-11 MED ORDER — DEXAMETHASONE SOD PHOSPHATE PF 10 MG/ML IJ SOLN
20.0000 mg | Freq: Once | INTRAMUSCULAR | Status: AC
  Administered 2024-10-11: 20 mg

## 2024-10-11 MED ORDER — LIDOCAINE HCL 2 % IJ SOLN
INTRAMUSCULAR | Status: AC
  Filled 2024-10-11: qty 20

## 2024-10-11 MED ORDER — PENTAFLUOROPROP-TETRAFLUOROETH EX AERO: INHALATION_SPRAY | Freq: Once | CUTANEOUS | Status: AC

## 2024-10-11 MED ORDER — FENTANYL CITRATE (PF) 100 MCG/2ML IJ SOLN
INTRAMUSCULAR | Status: AC
  Filled 2024-10-11: qty 2

## 2024-10-11 MED ORDER — MIDAZOLAM HCL 5 MG/5ML IJ SOLN
0.5000 mg | Freq: Once | INTRAMUSCULAR | Status: AC
  Administered 2024-10-11: 2 mg via INTRAVENOUS
  Administered 2024-10-11 (×2): 1 mg via INTRAVENOUS

## 2024-10-11 MED ORDER — ROPIVACAINE HCL 2 MG/ML IJ SOLN
18.0000 mL | Freq: Once | INTRAMUSCULAR | Status: AC
  Administered 2024-10-11: 18 mL via PERINEURAL

## 2024-10-11 MED ORDER — MIDAZOLAM HCL 5 MG/5ML IJ SOLN
INTRAMUSCULAR | Status: AC
  Filled 2024-10-11: qty 5

## 2024-10-11 MED ORDER — ROPIVACAINE HCL 2 MG/ML IJ SOLN
INTRAMUSCULAR | Status: AC
  Filled 2024-10-11: qty 20

## 2024-10-11 NOTE — Progress Notes (Signed)
 PROVIDER NOTE: Interpretation of information contained herein should be left to medically-trained personnel. Specific patient instructions are provided elsewhere under Patient Instructions section of medical record. This document was created in part using STT-dictation technology, any transcriptional errors that may result from this process are unintentional.  Patient: Miranda Fleming Type: Established DOB: 1965/12/16 MRN: [id number] PCP: Myrna Camelia HERO, NP  Service: Procedure DOS: 10/11/2024 Setting: Ambulatory Location: Ambulatory outpatient facility Delivery: Face-to-face Provider: Eric DELENA Como, MD Specialty: Interventional Pain Management Specialty designation: 09 Location: Outpatient facility Ref. Prov.: Myrna Camelia HERO, NP       Interventional Therapy   Procedure: Thoracic facet medial branch block #1  Laterality: Bilateral (-50)  Level: T5, T6, T7, T8, and T9 Medial Branch Level(s)  Imaging: Fluoroscopy-guided Spinal ([id number]) Anesthesia: Local anesthesia (1-2% Lidocaine ) Anxiolysis: IV Versed   4.0 mg           Sedation: Minimal Sedation Fentanyl  1 mL (50 mcg) DOS: 10/11/2024  Performed by: Eric DELENA Como, MD  Purpose: Diagnostic/Therapeutic Indications: Thoracic back pain severe enough to impact quality of life or function. Rationale (medical necessity): procedure needed and proper for the diagnosis and/or treatment of Miranda Fleming's medical symptoms and needs. 1. Chronic thoracic back pain (Bilateral)   2. Thoracic facet arthropathy   3. Thoracic facet joint pain   4. Spondylosis without myelopathy or radiculopathy, thoracic region   5. Grade 1 Anterolisthesis of thoracic spine (T2-3)   6. Thoracic spine pain   7. DDD (degenerative disc disease), thoracic   8. Thoracic vertebrogenic pain   9. Compression of thoracic vertebra, sequela (T1, T2, T3, T4)   10. Non-traumatic compression fracture of T1 thoracic vertebra, sequela   11. Non-traumatic  compression fracture of T2 thoracic vertebra, sequela   12. Non-traumatic compression fracture of T3 thoracic vertebra, sequela   13. Non-traumatic compression fracture of T4 thoracic vertebra, sequela   14. Latex precautions, history of latex allergy    NAS-11 Pain score:   Pre-procedure: 9 /10   Post-procedure: 0-No pain/10     Target: Thoracic posterior (dorsal) primary rami nerve Location: 2.5 cm lateral to midline (spinous process), just cephalad to upper transverse process edge.  (needle tip placement) Region: Thoracic  Approach: Paravertebral  Type of procedure: Percutaneous perineural nerve block   Pertinent Anatomy: There are two potential fascial compartments in the TPVS: the anterior extrapleural paravertebral compartment and the posterior subendothoracic paravertebral compartment. The transverse process and the superior costotransverse ligament form the posterior boundary.  Position / Prep / Materials:  Position: Prone  Prep solution: ChloraPrep (2% chlorhexidine gluconate and 70% isopropyl alcohol) Prep Area: Entire upper back region Materials:  Tray: Block Needle(s):  Type: Spinal  Gauge (G): 22  Length: 3.5-in  Qty: 4  H&P (Pre-op Assessment):  Miranda Fleming is a 58 y.o. (year old), female patient, seen today for interventional treatment. She  has a past surgical history that includes EXPLORATORY LAPAROTOMY W/  LEFT SALPINGOOPHORECTOMY (01-25-2001); Abdominal hysterectomy (1995); LAPAROSCOPY'S AND LAPAROTOMY'S FOR PERSISTANT OVARIAN CYST (X 5  1987;  1989;  1990;  1993;  1994); and Pubovaginal sling (N/A, 10/31/2013). Miranda Fleming has a current medication list which includes the following prescription(s): acetaminophen , alprazolam , b complex vitamins, gabapentin , multiple vitamin, tramadol, and ondansetron , and the following Facility-Administered Medications: fentanyl . Her primarily concern today is the Back Pain (upper)  Initial Vital Signs:  Pulse/HCG Rate: (!) 110ECG  Heart Rate: 92 Temp: (!) 96.8 F (36 C) Resp: 18 BP: 102/83 SpO2: 98 %  BMI: Estimated  body mass index is 20.8 kg/m as calculated from the following:   Height as of this encounter: 5' 5 (1.651 m).   Weight as of this encounter: 125 lb (56.7 kg).  Risk Assessment: Allergies: Reviewed. She is allergic to amoxicillin and latex.  Allergy Precautions: None required Coagulopathies: Reviewed. None identified.  Blood-thinner therapy: None at this time Active Infection(s): Reviewed. None identified. Miranda Fleming is afebrile  Site Confirmation: Miranda Fleming was asked to confirm the procedure and laterality before marking the site Procedure checklist: Completed Consent: Before the procedure and under the influence of no sedative(s), amnesic(s), or anxiolytics, the patient was informed of the treatment options, risks and possible complications. To fulfill our ethical and legal obligations, as recommended by the American Medical Association's Code of Ethics, I have informed the patient of my clinical impression; the nature and purpose of the treatment or procedure; the risks, benefits, and possible complications of the intervention; the alternatives, including doing nothing; the risk(s) and benefit(s) of the alternative treatment(s) or procedure(s); and the risk(s) and benefit(s) of doing nothing. The patient was provided information about the general risks and possible complications associated with the procedure. These may include, but are not limited to: failure to achieve desired goals, infection, bleeding, organ or nerve damage, allergic reactions, paralysis, and death. In addition, the patient was informed of those risks and complications associated to Spine-related procedures, such as failure to decrease pain; infection (i.e.: Meningitis, epidural or intraspinal abscess); bleeding (i.e.: epidural hematoma, subarachnoid hemorrhage, or any other type of intraspinal or peri-dural bleeding); organ or  nerve damage (i.e.: Any type of peripheral nerve, nerve root, or spinal cord injury) with subsequent damage to sensory, motor, and/or autonomic systems, resulting in permanent pain, numbness, and/or weakness of one or several areas of the body; allergic reactions; (i.e.: anaphylactic reaction); and/or death. Furthermore, the patient was informed of those risks and complications associated with the medications. These include, but are not limited to: allergic reactions (i.e.: anaphylactic or anaphylactoid reaction(s)); adrenal axis suppression; blood sugar elevation that in diabetics may result in ketoacidosis or comma; water  retention that in patients with history of congestive heart failure may result in shortness of breath, pulmonary edema, and decompensation with resultant heart failure; weight gain; swelling or edema; medication-induced neural toxicity; particulate matter embolism and blood vessel occlusion with resultant organ, and/or nervous system infarction; and/or aseptic necrosis of one or more joints. Finally, the patient was informed that Medicine is not an exact science; therefore, there is also the possibility of unforeseen or unpredictable risks and/or possible complications that may result in a catastrophic outcome. The patient indicated having understood very clearly. We have given the patient no guarantees and we have made no promises. Enough time was given to the patient to ask questions, all of which were answered to the patient's satisfaction. Miranda Fleming has indicated that she wanted to continue with the procedure. Attestation: I, the ordering provider, attest that I have discussed with the patient the benefits, risks, side-effects, alternatives, likelihood of achieving goals, and potential problems during recovery for the procedure that I have provided informed consent. Date  Time: 10/11/2024 11:22 AM  Pre-Procedure Preparation:  Monitoring: As per clinic protocol. Respiration, ETCO2,  SpO2, BP, heart rate and rhythm monitor placed and checked for adequate function Safety Precautions: Patient was assessed for positional comfort and pressure points before starting the procedure. Time-out: I initiated and conducted the Time-out before starting the procedure, as per protocol. The patient was asked to participate by confirming  the accuracy of the Time Out information. Verification of the correct person, site, and procedure were performed and confirmed by me, the nursing staff, and the patient. Time-out conducted as per Joint Commission's Universal Protocol (UP.01.01.01). Time: 1227 Start Time: 1227 hrs.  Description/Narrative of Procedure:          Start Time: 1227 hrs. Technical description of procedure:  Rationale (medical necessity): procedure needed and proper for the diagnosis and/or treatment of the patient's medical symptoms and needs. Procedural Technique Safety Precautions: Aspiration looking for blood return was conducted prior to all injections. At no point did we inject any substances, as a needle was being advanced. No attempts were made at seeking any paresthesias. Safe injection practices and needle disposal techniques used. Medications properly checked for expiration dates. SDV (single dose vial) medications used. Target Area: For the thoracic medial branch nerve, the target is located between the top of the transverse processes at the point where the transverse process joints the vertebra and the superior-lateral aspect of the transverse process.  (More lateral towards the superior aspect of the thoracic spine.) For safety reasons and to minimize the risk of hemo- or pneumothorax, the needle tip was not advanced past the boundary of the posterior paravertebral compartment (superior costotransverse ligament). Description of the Procedure: Protocol guidelines were followed. The patient was placed in position over the fluoroscopy table. The target area was identified  and the area prepped in the usual manner. Skin & deeper tissues infiltrated with local anesthetic. Appropriate amount of time allowed to pass for local anesthetics to take effect. The procedure needles were then advanced to the target area. Proper needle placement secured. Negative aspiration confirmed. Solution injected in intermittent fashion, asking for systemic symptoms every 0.5cc of injectate. The needles were then removed and the area cleansed, making sure to leave some of the prepping solution back to take advantage of its long term bactericidal properties.             Facet Joint Innervation  T1-2 C8, T1  Medial Branch  T2-3 T1, T2                     T3-4 T2, T3                     T4-5 T3, T4                     T5-6 T4, T5                     T6-7 T5, T6                     T7-8 T6, T7                     T8-9 T7, T8                     T9-10 T8, T9                     T10-11 T9, T10                     T11-12 T10, T11                     T12-L1 T11, T12  Vitals:   10/11/24 1238 10/11/24 1248 10/11/24 1258 10/11/24 1308  BP: 120/72 95/70 124/60 (!) 115/58  Pulse: 84     Resp: 20 12 12 15   Temp:      TempSrc:      SpO2: 100% 97% 98% 98%  Weight:      Height:         End Time: 1236 hrs.  Imaging Guidance (Spinal):         Type of Imaging Technique: Fluoroscopy Guidance (Spinal) Indication(s): Fluoroscopy guidance for needle placement to enhance accuracy in procedures requiring precise needle localization for targeted delivery of medication in or near specific anatomical locations not easily accessible without such real-time imaging assistance. Exposure Time: Please see nurses notes. Contrast: None used. Fluoroscopic Guidance: I was personally present during the use of fluoroscopy. Tunnel Vision Technique used to obtain the best possible view of the target area. Parallax error corrected before commencing the procedure.  Direction-depth-direction technique used to introduce the needle under continuous pulsed fluoroscopy. Once target was reached, antero-posterior, oblique, and lateral fluoroscopic projection used confirm needle placement in all planes. Images permanently stored in EMR. Interpretation: No contrast injected. I personally interpreted the imaging intraoperatively. Adequate needle placement confirmed in multiple planes. Permanent images saved into the patient's record.  Post-operative Assessment:  Post-procedure Vital Signs:  Pulse/HCG Rate: 8485 Temp: (!) 96.8 F (36 C) Resp: 15 BP: (!) 115/58 SpO2: 98 %  EBL: None  Complications: No immediate post-treatment complications observed by team, or reported by patient.  Note: The patient tolerated the entire procedure well. A repeat set of vitals were taken after the procedure and the patient was kept under observation following institutional policy, for this type of procedure. Post-procedural neurological assessment was performed, showing return to baseline, prior to discharge. The patient was provided with post-procedure discharge instructions, including a section on how to identify potential problems. Should any problems arise concerning this procedure, the patient was given instructions to immediately contact us , at any time, without hesitation. In any case, we plan to contact the patient by telephone for a follow-up status report regarding this interventional procedure.  Comments:  No additional relevant information.  Plan of Care (POC)  Orders:  Orders Placed This Encounter  Procedures   THORACIC FACET BLOCK    Scheduling Instructions:     Thoracic Medial Branch Block     Side: Bilateral     Level(s): TBD     Sedation: With Sedation.     Date: 10/11/2024    Where will this procedure be performed?:   [facility]   [facility] C-ARM 1-60 MIN NO REPORT    Intraoperative interpretation by procedural physician at [facility].    Standing Status:   Standing    Number of Occurrences:   1    Reason for exam::   Assistance in needle guidance and placement for procedures requiring needle placement in or near specific anatomical locations not easily accessible without such assistance.   Informed Consent Details: Physician/Practitioner Attestation; Transcribe to consent form and obtain patient signature    Nursing Order: Transcribe to consent form and obtain patient signature. Note: Always confirm laterality of pain with Miranda Fleming, before procedure.    Physician/Practitioner attestation of informed consent for procedure/surgical case:   I, the physician/practitioner, attest that I have discussed with the patient the benefits, risks, side effects, alternatives, likelihood of achieving goals and potential problems during recovery for the procedure that I have provided informed  consent.    Procedure:   Thoracic medial branch facet block    Physician/Practitioner performing the procedure:   Tamma Brigandi A. 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    Standing Status:   Standing    Number of Occurrences:   1   Latex precautions    Activate Latex-Free Protocol.    Standing Status:   Standing    Number of Occurrences:   1     Opioid Analgesic: No chronic opioid analgesics therapy prescribed by our practice. Tramadol 50 mg tablet, 1 tab p.o. daily (# 30)  (30/month) (last filled on 11/28/2023) MME/day: 10 mg/day    Medications ordered for procedure: Meds ordered this encounter  Medications   lidocaine  (XYLOCAINE ) 2 % (with pres) injection 400 mg   pentafluoroprop-tetrafluoroeth (GEBAUERS) aerosol   midazolam  (VERSED ) 5 MG/5ML injection 0.5-2 mg    Make sure Flumazenil is available in the pyxis when using this medication. If oversedation occurs, administer 0.2 mg IV over 15 sec. If after 45 sec no response, administer 0.2 mg again over 1 min; may repeat at 1 min intervals; not to exceed 4 doses (1 mg)   fentaNYL  (SUBLIMAZE ) injection 25-50 mcg    Make sure Narcan is available in the pyxis when using this medication. In the event of respiratory depression (RR< 8/min): Titrate NARCAN (naloxone) in increments of 0.1 to 0.2 mg IV at 2-3 minute intervals, until desired degree of reversal.   ropivacaine  (PF) 2 mg/mL (0.2%) (NAROPIN ) injection 18 mL   dexamethasone  (DECADRON ) injection 20 mg   Medications administered: We administered lidocaine , pentafluoroprop-tetrafluoroeth, midazolam , fentaNYL , ropivacaine  (PF) 2 mg/mL (0.2%), and dexamethasone .  See the medical record for exact dosing, route, and time of administration.    Interventional Therapies  Risk Factors  Considerations  Medical Comorbidities:  Allergy: LATEX  Hx. DVT  paroxysmal dizziness     Planned  Pending:   Diagnostic bilateral thoracic facet MBB #1 (with fluoroscopic mapping)   Under consideration:   Diagnostic bilateral thoracic facet MBB #1    Completed:   Diagnostic left cervical ESI x2 (09/13/2024) (100/100/98/98)  Diagnostic/therapeutic bilateral cervical facet (TON-C6) MBB x1 (05/24/2024) (100/100/0/0)  Diagnostic/therapeutic midline (T2-3) thoracic ESI x1 (12/24/2023) (100/100/5/RUE:100LUE:90UB:5-10)  Therapeutic left (T2-3) thoracic ESI x1 (03/10/2024) (100/100/90x36hrs/45)  Therapeutic left (T3-4) thoracic ESI x1 (04/07/2024) (DNKFU-Returned 05/11/2024)  (0/0/0/0)    Therapeutic  Palliative (PRN) options:   Therapeutic/palliative left cervical ESI #3    Completed by other providers:   None reported      Follow-up plan:   Return in about 2 weeks (around 10/25/2024) for (Face2F), (PPE).     Recent Visits Date Type Provider Dept  10/05/24 Office Visit Tanya Glisson, MD [facility]  09/13/24 Procedure visit Tanya Glisson, MD [facility]  09/12/24 Office Visit Tanya Glisson, MD [facility]  08/23/24 Procedure visit Tanya Glisson, MD [facility]  08/22/24  Office Visit Tanya Glisson, MD [facility]  Showing recent visits within past 90 days and meeting all other requirements Today's Visits Date Type Provider Dept  10/11/24 Procedure visit Tanya Glisson, MD [facility]  Showing today's visits and meeting all other requirements Future Appointments Date Type Provider Dept  10/26/24 Appointment Tanya Glisson, MD [facility]  Showing future appointments within next 90 days and meeting all other requirements   Disposition: Discharge home  Discharge (Date  Time): 10/11/2024; 1310 hrs.   Primary Care Physician: Myrna Camelia HERO, NP Location: [facility] Note by: Glisson DELENA Tanya, MD (TTS technology used. I apologize for any typographical errors that were not detected and corrected.) Date: 10/11/2024; Time: 1:30 PM  Disclaimer:  Medicine is not an visual merchandiser. The only guarantee in medicine is that nothing is guaranteed. It is important to note that the decision to proceed with this intervention was based on the information collected from the patient. The Data and conclusions were drawn from the patient's questionnaire, the interview, and the physical examination. Because the information was provided in large part by the patient, it cannot be guaranteed that it has not been purposely or unconsciously  manipulated. Every effort has been made to obtain as much relevant data as possible for this evaluation. It is important to note that the conclusions that lead to this procedure are derived in large part from the available data. Always take into account that the treatment will also be dependent on availability of resources and existing treatment guidelines, considered by other Pain Management Practitioners as being common knowledge and practice, at the time of the intervention. For Medico-Legal purposes, it is also important to point out that variation in procedural techniques and pharmacological choices are the acceptable norm. The indications, contraindications, technique, and results of the above procedure should only be interpreted and judged by a Board-Certified Interventional Pain Specialist with extensive familiarity and expertise in the same exact procedure and technique.

## 2024-10-11 NOTE — Patient Instructions (Signed)
 ______________________________________________________________________    Post-Procedure Discharge Instructions  INSTRUCTIONS Apply ice:  Purpose: This will minimize any swelling and discomfort after procedure.  When: Day of procedure, as soon as you get home. How: Fill a plastic sandwich bag with crushed ice. Cover it with a small towel and apply to injection site. How long: (15 min on, 15 min off) Apply for 15 minutes then remove x 15 minutes.  Repeat sequence on day of procedure, until you go to bed. Apply heat:  Purpose: To treat any soreness and discomfort from the procedure. When: Starting the next day after the procedure. How: Apply heat to procedure site starting the day following the procedure. How long: May continue to repeat daily, until discomfort goes away. Food intake: Start with clear liquids (like water) and advance to regular food, as tolerated.  Physical activities: Keep activities to a minimum for the first 8 hours after the procedure. After that, then as tolerated. Driving: If you have received any sedation, be responsible and do not drive. You are not allowed to drive for 24 hours after having sedation. Blood thinner: (Applies only to those taking blood thinners) You may restart your blood thinner 6 hours after your procedure. Insulin: (Applies only to Diabetic patients taking insulin) As soon as you can eat, you may resume your normal dosing schedule. Infection prevention: Keep procedure site clean and dry. Shower daily and clean area with soap and water.  PAIN DIARY Post-procedure Pain Diary: Extremely important that this be done correctly and accurately. Recorded information will be used to determine the next step in treatment. For the purpose of accuracy, follow these rules: Evaluate only the area treated. Do not report or include pain from an untreated area. For the purpose of this evaluation, ignore all other areas of pain, except for the treated area. After your  procedure, avoid taking a long nap and attempting to complete the pain diary after you wake up. Instead, set your alarm clock to go off every hour, on the hour, for the initial 8 hours after the procedure. Document the duration of the numbing medicine, and the relief you are getting from it. Do not go to sleep and attempt to complete it later. It will not be accurate. If you received sedation, it is likely that you were given a medication that may cause amnesia. Because of this, completing the diary at a later time may cause the information to be inaccurate. This information is needed to plan your care. Follow-up appointment: Keep your post-procedure follow-up evaluation appointment after the procedure (usually 2 weeks for most procedures, 6 weeks for radiofrequencies). DO NOT FORGET to bring you pain diary with you.   EXPECT... (What should I expect to see with my procedure?) From numbing medicine (AKA: Local Anesthetics): Numbness or decrease in pain. You may also experience some weakness, which if present, could last for the duration of the local anesthetic. Onset: Full effect within 15 minutes of injected. Duration: It will depend on the type of local anesthetic used. On the average, 1 to 8 hours.  From steroids (Applies only if steroids were used): Decrease in swelling or inflammation. Once inflammation is improved, relief of the pain will follow. Onset of benefits: Depends on the amount of swelling present. The more swelling, the longer it will take for the benefits to be seen. In some cases, up to 10 days. Duration: Steroids will stay in the system x 2 weeks. Duration of benefits will depend on multiple posibilities including persistent irritating  factors. Side-effects: If present, they may typically last 2 weeks (the duration of the steroids). Frequent: Cramps (if they occur, drink Gatorade and take over-the-counter Magnesium 450-500 mg once to twice a day); water retention with temporary weight  gain; increases in blood sugar; decreased immune system response; increased appetite. Occasional: Facial flushing (red, warm cheeks); mood swings; menstrual changes. Uncommon: Long-term decrease or suppression of natural hormones; bone thinning. (These are more common with higher doses or more frequent use. This is why we prefer that our patients avoid having any injection therapies in other practices.)  Very Rare: Severe mood changes; psychosis; aseptic necrosis. From procedure: Some discomfort is to be expected once the numbing medicine wears off. This should be minimal if ice and heat are applied as instructed.  CALL IF... (When should I call?) You experience numbness and weakness that gets worse with time, as opposed to wearing off. New onset bowel or bladder incontinence. (Applies only to procedures done in the spine)  Emergency Numbers: Durning business hours (Monday - Thursday, 8:00 AM - 4:00 PM) (Friday, 9:00 AM - 12:00 Noon): [phone] After hours: [phone] NOTE: If you are having a problem and are unable connect with, or to talk to a provider, then go to your nearest urgent care or emergency department. If the problem is serious and urgent, please call 911. ______________________________________________________________________     ______________________________________________________________________    Steroid injections  Common steroids for injections Triamcinolone : Used by many sports medicine physicians for large joint and bursal injections, often combined with a local anesthetic like lidocaine . A study focusing on coccydynia (tailbone pain) found triamcinolone  was more effective than betamethasone, suggesting it may also be preferable for other localized inflammation conditions. Methylprednisolone: A common alternative to triamcinolone  that is also a strong anti-inflammatory. It is available in different formulations, with the acetate suspension being the long-acting  option for intra-articular injections. Dexamethasone : This is a non-particulate steroid, meaning it has a lower risk of tissue damage compared to particulate steroids like triamcinolone  and methylprednisolone. While less common for this specific use, it is an option for targeted injections.   Considerations for physicians Particulate vs. non-particulate steroids: Triamcinolone  and methylprednisolone are particulate, meaning they can clump together. Dexamethasone  is non-particulate. Particulate steroids are often preferred for their longer-lasting effects but carry a theoretical higher risk for certain injections (though this is less of a concern in the costochondral joints). Combined injectate: Corticosteroids are typically mixed with a local anesthetic like lidocaine  to provide both immediate pain relief (from the anesthetic) and longer-term inflammation reduction (from the steroid). Imaging guidance: To ensure accurate placement of the needle and medication, physicians may use ultrasound or fluoroscopic guidance for the injection, especially in complex or refractory cases.   Patient guidance Before undergoing a steroid injection, discuss the options with your physician. They will determine the best steroid, dosage, and procedure for your specific case based on factors like: Severity of your condition History of response to other treatments Your overall health status Experience and preference of the physician  Last  Updated: 07/05/2024 ______________________________________________________________________

## 2024-10-12 ENCOUNTER — Telehealth: Payer: Self-pay | Admitting: *Deleted

## 2024-10-12 NOTE — Telephone Encounter (Signed)
 Post procedure call:   no  questions or concerns.

## 2024-10-26 ENCOUNTER — Ambulatory Visit: Admitting: Pain Medicine

## 2024-10-26 DIAGNOSIS — Z91199 Patient's noncompliance with other medical treatment and regimen due to unspecified reason: Secondary | ICD-10-CM

## 2024-10-26 DIAGNOSIS — G8929 Other chronic pain: Secondary | ICD-10-CM

## 2024-10-26 DIAGNOSIS — Z09 Encounter for follow-up examination after completed treatment for conditions other than malignant neoplasm: Secondary | ICD-10-CM

## 2024-10-26 NOTE — Progress Notes (Signed)
 Department: [facility]) Date: 10/26/2024  Event: Canceled/Rescheduled by patient.  Encounter Type: (PPE) Post-procedure evaluation. Visit to review the results of the diagnostic portion of the interventional therapy. Advance notice: Less than 24 hr notice.  Reason: Sickness.          Significance: Unintended loss of valuable diagnostic information. Accuracy of recall has been shown to be inversely proportional to amount of time since procedure. Note: Patient will R/S

## 2024-10-27 ENCOUNTER — Ambulatory Visit: Payer: Self-pay | Admitting: Cardiovascular Disease

## 2024-10-27 DIAGNOSIS — R002 Palpitations: Secondary | ICD-10-CM | POA: Diagnosis not present

## 2024-10-31 ENCOUNTER — Ambulatory Visit: Attending: Cardiovascular Disease

## 2024-10-31 DIAGNOSIS — R0602 Shortness of breath: Secondary | ICD-10-CM | POA: Diagnosis not present

## 2024-10-31 LAB — ECHOCARDIOGRAM COMPLETE
AR max vel: 2.39 cm2
AV Area VTI: 2.16 cm2
AV Area mean vel: 2.41 cm2
AV Mean grad: 4 mmHg
AV Peak grad: 7.1 mmHg
Ao pk vel: 1.33 m/s
Area-P 1/2: 3.93 cm2
Calc EF: 52.8 %
S' Lateral: 3.1 cm
Single Plane A2C EF: 52 %
Single Plane A4C EF: 52.8 %

## 2024-11-01 ENCOUNTER — Encounter: Payer: Self-pay | Admitting: Cardiology

## 2024-11-01 ENCOUNTER — Ambulatory Visit: Attending: Cardiology | Admitting: Cardiology

## 2024-11-01 VITALS — BP 118/68 | HR 105 | Ht 65.0 in | Wt 115.2 lb

## 2024-11-01 DIAGNOSIS — R000 Tachycardia, unspecified: Secondary | ICD-10-CM | POA: Diagnosis not present

## 2024-11-01 DIAGNOSIS — R002 Palpitations: Secondary | ICD-10-CM

## 2024-11-01 DIAGNOSIS — R0602 Shortness of breath: Secondary | ICD-10-CM

## 2024-11-01 DIAGNOSIS — R072 Precordial pain: Secondary | ICD-10-CM | POA: Diagnosis not present

## 2024-11-01 DIAGNOSIS — R5381 Other malaise: Secondary | ICD-10-CM

## 2024-11-01 DIAGNOSIS — I951 Orthostatic hypotension: Secondary | ICD-10-CM

## 2024-11-01 NOTE — Progress Notes (Signed)
 Cardiology notes efaxed to Dr. Burnetta at [facility] at [phone]

## 2024-11-01 NOTE — Patient Instructions (Signed)
 Medication Instructions:  Your physician recommends that you continue on your current medications as directed. Please refer to the Current Medication list given to you today.   *If you need a refill on your cardiac medications before your next appointment, please call your pharmacy*  Lab Work: No labs ordered today  If you have labs (blood work) drawn today and your tests are completely normal, you will receive your results only by: MyChart Message (if you have MyChart) OR A paper copy in the mail If you have any lab test that is abnormal or we need to change your treatment, we will call you to review the results.  Testing/Procedures: Your provider has ordered a Lexiscan/ Exercise Myoview Stress test. This will take place at [facility]. Please report to the [facility] medical mall entrance. The volunteers at the first desk will direct you where to go.  [facility] MYOVIEW  Your provider has ordered a Stress Test with nuclear imaging. The purpose of this test is to evaluate the blood supply to your heart muscle. This procedure is referred to as a Non-Invasive Stress Test. This is because other than having an IV started in your vein, nothing is inserted or invades your body. Cardiac stress tests are done to find areas of poor blood flow to the heart by determining the extent of coronary artery disease (CAD). Some patients exercise on a treadmill, which naturally increases the blood flow to your heart, while others who are unable to walk on a treadmill due to physical limitations will have a pharmacologic/chemical stress agent called Lexiscan . This medicine will mimic walking on a treadmill by temporarily increasing your coronary blood flow.   Please note: these test may take anywhere between 2-4 hours to complete  How to prepare for your Myoview test:  Nothing to eat for 6 hours prior to the test No caffeine for 24 hours prior to test No smoking 24 hours prior to test. Your medication may be taken with  water .  If your doctor stopped a medication because of this test, do not take that medication. Ladies, please do not wear dresses.  Skirts or pants are appropriate. Please wear a short sleeve shirt. No perfume, cologne or lotion. Wear comfortable walking shoes. No heels!   PLEASE NOTIFY THE OFFICE AT LEAST 24 HOURS IN ADVANCE IF YOU ARE UNABLE TO KEEP YOUR APPOINTMENT.  [phone] AND  PLEASE NOTIFY NUCLEAR MEDICINE AT [facility] AT LEAST 24 HOURS IN ADVANCE IF YOU ARE UNABLE TO KEEP YOUR APPOINTMENT. [phone]   Follow-Up: At [facility], you and your health needs are our priority.  As part of our continuing mission to provide you with exceptional heart care, our providers are all part of one team.  This team includes your primary Cardiologist (physician) and Advanced Practice Providers or APPs (Physician Assistants and Nurse Practitioners) who all work together to provide you with the care you need, when you need it.  Your next appointment:   Post procedure   Provider:   You may see Deatrice Cage, MD or one of the following Advanced Practice Providers on your designated Care Team:   Tylene Lunch, NP

## 2024-11-01 NOTE — Progress Notes (Signed)
 " Cardiology Office Note   Date:  11/01/2024  ID:  Dorthey, Depace Mar 17, 1966, MRN [id number] PCP: Myrna Camelia HERO, NP  [facility] Providers Cardiologist:  Deatrice Cage, MD     History of Present Illness Miranda Fleming is a 58 y.o. female with a past medical history of tachycardia, dizziness, syncope, who is here today for follow-up.  She cervical spine fracture in 2024 and had significant chronic pain since the required frequent epidural injections.  Her mobility was very limited at the present time and she walks slowly with a walker.  Over the last year she has experienced extreme dizziness especially when she was standing up with episodes of loss of consciousness associated with palpitations and tachycardia.  She denied any further associated symptoms or chest pain but did report shortness of breath with minimal exertion.  She was last seen in clinic 09/29/2024 by Dr. Cage.  She continued to suffer from orthostatic hypotension, as she had a significant drop in her blood pressure with a standing position and associated symptoms.  Her heart rate increased from 89-107.  She was encouraged to increase hydration and salt intake.  It was suspected that due to her decrease in physical activities contributed to dysfunctional autonomic regulation.  She was placed on a 2-week monitor and scheduled for an echocardiogram to evaluate functional and structural abnormalities.  She returns to clinic today with multiple complaints.  She states that she has been having chest pressure and chest tightness that feels as though something is sitting on her chest which leads to shortness of breath, palpitations, and dizziness and lightheadedness.  She continues to suffer from palpitations and has had worsening migraines.  She states she can be sitting on the couch and will notice that her heart rate is about 144 per her Apple Watch.  She continues to have fatigue and is not sleeping.  She  has shortness of breath with dyspnea on exertion with any activity.  She previously had worn a brace under the direction of orthopedics and is now working to get back in to physical therapy.  She has been working on maintaining adequate hydration and having additional sodium intake to try to elevate blood pressure.  Unfortunately she has not tried compression therapy.  She continues to fall and continues to have multiple falls.  She has followed up with vascular, neurosurgery, pain medicine, and orthopedics.  She denies any recent hospitalizations or visits to the emergency department.  ROS: 10 point review of systems has been reviewed and considered negative the exception was been listed in the HPI  Studies Reviewed EKG Interpretation Date/Time:  Tuesday November 01 2024 10:14:35 EST Ventricular Rate:  105 PR Interval:  128 QRS Duration:  68 QT Interval:  342 QTC Calculation: 452 R Axis:   6  Text Interpretation: Sinus tachycardia Low voltage QRS When compared with ECG of 29-Sep-2024 08:44, No significant change was found Confirmed by Gerard Frederick ([id number]) on 11/01/2024 10:17:59 AM    2d echo 10/31/2024  1. Left ventricular ejection fraction, by estimation, is 55 to 60%. The  left ventricle has normal function. The left ventricle has no regional  wall motion abnormalities. There is mild left ventricular hypertrophy.  Left ventricular diastolic parameters  are indeterminate.   2. Right ventricular systolic function is normal. The right ventricular  size is normal.   3. The mitral valve is normal in structure. Trivial mitral valve  regurgitation.   4. The aortic valve is tricuspid. Aortic  valve regurgitation is not  visualized.   5. The inferior vena cava is normal in size with greater than 50%  respiratory variability, suggesting right atrial pressure of 3 mmHg.   Event monitor (Zio) 10/24/2024 Patient had a min HR of 60 bpm, max HR of 200 bpm, and avg HR of 87 bpm. Predominant  underlying rhythm was Sinus Rhythm. Slight P wave morphology changes were noted.  11 Supraventricular Tachycardia runs occurred, the run with the fastest interval lasting 7 beats with a max rate of 200 bpm, the longest lasting 16 beats with an avg rate of 119 bpm.  Rare PACs and rare PVCs. Most triggered events correlated with sinus tachycardia. Risk Assessment/Calculations           Physical Exam VS:  BP 118/68 (BP Location: Left Arm, Patient Position: Sitting, Cuff Size: Normal)   Pulse (!) 105   Ht 5' 5 (1.651 m)   Wt 115 lb 3.2 oz (52.3 kg)   SpO2 98%   BMI 19.17 kg/m        Wt Readings from Last 3 Encounters:  11/01/24 115 lb 3.2 oz (52.3 kg)  10/11/24 125 lb (56.7 kg)  10/05/24 115 lb (52.2 kg)    GEN: Well nourished, well developed in no acute distress NECK: No JVD; No carotid bruits CARDIAC: RRR, tachycardic, no murmurs, rubs, gallops RESPIRATORY:  Clear to auscultation without rales, wheezing or rhonchi  ABDOMEN: Soft, non-tender, non-distended EXTREMITIES:  No edema; No deformity   ASSESSMENT AND PLAN Precordial pain which she states she feels something is sitting on her chest stent in the heavy pressure.  Is concerning is that she has a family history of coronary artery disease.  EKG today reveals sinus tachycardia with a rate of 105 with low voltage QRS with no significant change noted.  With ongoing complaints she has been scheduled for Lexiscan Myoview to rule out any ischemic causes of chest discomfort.  Orthostatic hypotension with continued significant drops noted in blood pressure and falling.  She has been encouraged to continue with hydration and salt intake.  She has also been encouraged to start with compression therapy and a recumbent exercises.  She states that after she is finished with her testing that she is working with Dareen to get back into physical therapy as large quantity of her symptoms can be related to deconditioning.  Intermittent  palpitations with a recent ZIO monitor revealing average heart rate of 87 bpm underlying predominant rhythm was sinus rhythm.  She had 11 supraventricular tachycardia runs with the fastest lasting 7 beats.  There were rare PACs and PVCs.  Her events were triggered with sinus tachycardia.  No arrhythmia or pauses noted to suggest cause of syncope.  With her orthostatic hypotension we did discuss the possibility of starting beta-blocker therapy to assist with heart rate control.  Any medication changes have been deferred at this time until testing is completed to see how she does with her blood pressures with increasing her salt and fluid prior to initiating any further medications.  Shortness of breath on exertion that has been ongoing but is also likely multifactorial from physical deconditioning.  Echocardiogram revealed an LVEF of 55 to 60%, no RWMA, mild LVH, trivial mitral regurgitation.  So no structural or functional abnormalities were noted on her study.       Dispo: Patient return to consume day/APP once testing is completed or sooner if needed for further evaluation.  Signed, Artavius Stearns, NP   "

## 2024-11-11 ENCOUNTER — Other Ambulatory Visit: Payer: Self-pay | Admitting: Pain Medicine

## 2024-11-11 DIAGNOSIS — M4854XS Collapsed vertebra, not elsewhere classified, thoracic region, sequela of fracture: Secondary | ICD-10-CM

## 2024-11-11 DIAGNOSIS — M542 Cervicalgia: Secondary | ICD-10-CM

## 2024-11-11 DIAGNOSIS — M4312 Spondylolisthesis, cervical region: Secondary | ICD-10-CM

## 2024-11-11 DIAGNOSIS — S22020D Wedge compression fracture of second thoracic vertebra, subsequent encounter for fracture with routine healing: Secondary | ICD-10-CM

## 2024-11-11 DIAGNOSIS — M792 Neuralgia and neuritis, unspecified: Secondary | ICD-10-CM

## 2024-11-11 DIAGNOSIS — M5030 Other cervical disc degeneration, unspecified cervical region: Secondary | ICD-10-CM

## 2024-11-11 DIAGNOSIS — G4486 Cervicogenic headache: Secondary | ICD-10-CM

## 2024-11-11 DIAGNOSIS — M4314 Spondylolisthesis, thoracic region: Secondary | ICD-10-CM

## 2024-11-11 DIAGNOSIS — R937 Abnormal findings on diagnostic imaging of other parts of musculoskeletal system: Secondary | ICD-10-CM

## 2024-11-21 ENCOUNTER — Encounter
Admission: RE | Admit: 2024-11-21 | Discharge: 2024-11-21 | Disposition: A | Discharge status: Home / Self Care | Source: Ambulatory Visit | Attending: Cardiology | Admitting: Cardiology

## 2024-11-21 DIAGNOSIS — R072 Precordial pain: Secondary | ICD-10-CM | POA: Diagnosis present

## 2024-11-21 LAB — NM MYOCAR MULTI W/SPECT W/WALL MOTION / EF
LV dias vol: 42 mL (ref 46–106)
LV sys vol: 14 mL
Nuc Stress EF: 67 %
Peak HR: 116 {beats}/min
Rest HR: 95 {beats}/min
Rest Nuclear Isotope Dose: 9.5 mCi
SDS: 0
SRS: 1
SSS: 0
ST Depression (mm): 0 mm
Stress Nuclear Isotope Dose: 33.3 mCi
TID: 1.04

## 2024-11-21 MED ORDER — TECHNETIUM TC 99M TETROFOSMIN IV KIT
10.0000 | PACK | Freq: Once | INTRAVENOUS | Status: AC | PRN
  Administered 2024-11-21: 9.52 via INTRAVENOUS

## 2024-11-21 MED ORDER — REGADENOSON 0.4 MG/5ML IV SOLN
0.4000 mg | Freq: Once | INTRAVENOUS | Status: AC
  Administered 2024-11-21: 0.4 mg via INTRAVENOUS

## 2024-11-21 MED ORDER — TECHNETIUM TC 99M TETROFOSMIN IV KIT
33.3000 | PACK | Freq: Once | INTRAVENOUS | Status: AC | PRN
  Administered 2024-11-21: 33.3 via INTRAVENOUS

## 2024-11-22 ENCOUNTER — Ambulatory Visit: Payer: Self-pay | Admitting: Cardiology

## 2024-11-22 NOTE — Progress Notes (Signed)
 Study is normal and considered low risk.  There is no evidence of ischemia and there is no evidence of infarct.  There is mild coronary calcium present on the attenuation correction CT images.  Overall reassuring study.

## 2024-11-24 ENCOUNTER — Encounter: Payer: Self-pay | Admitting: Cardiology

## 2024-11-24 ENCOUNTER — Ambulatory Visit: Attending: Cardiology | Admitting: Cardiology

## 2024-11-24 VITALS — BP 108/70 | HR 101 | Ht 65.0 in | Wt 113.2 lb

## 2024-11-24 DIAGNOSIS — R002 Palpitations: Secondary | ICD-10-CM

## 2024-11-24 DIAGNOSIS — R0602 Shortness of breath: Secondary | ICD-10-CM | POA: Diagnosis not present

## 2024-11-24 DIAGNOSIS — R0789 Other chest pain: Secondary | ICD-10-CM | POA: Diagnosis not present

## 2024-11-24 DIAGNOSIS — R000 Tachycardia, unspecified: Secondary | ICD-10-CM

## 2024-11-24 DIAGNOSIS — I951 Orthostatic hypotension: Secondary | ICD-10-CM

## 2024-11-24 MED ORDER — METOPROLOL SUCCINATE ER 25 MG PO TB24: 12.5000 mg | ORAL_TABLET | Freq: Every day | ORAL | 3 refills | Status: DC

## 2024-11-24 NOTE — Patient Instructions (Signed)
 Medication Instructions:  Your physician recommends the following medication changes.  START TAKING: Toprol  XL 12.5 mg at bedtime  *If you need a refill on your cardiac medications before your next appointment, please call your pharmacy*  Lab Work: No labs ordered today  If you have labs (blood work) drawn today and your tests are completely normal, you will receive your results only by: MyChart Message (if you have MyChart) OR A paper copy in the mail If you have any lab test that is abnormal or we need to change your treatment, we will call you to review the results.  Testing/Procedures: No test ordered today   Follow-Up: At [facility], you and your health needs are our priority.  As part of our continuing mission to provide you with exceptional heart care, our providers are all part of one team.  This team includes your primary Cardiologist (physician) and Advanced Practice Providers or APPs (Physician Assistants and Nurse Practitioners) who all work together to provide you with the care you need, when you need it.  Your next appointment:   4 week(s)  Provider:   You may see Deatrice Cage, MD or one of the following Advanced Practice Providers on your designated Care Team:   Tylene Lunch, NP

## 2024-11-24 NOTE — Progress Notes (Signed)
 " Cardiology Office Note   Date:  11/24/2024  ID:  Miranda, Fleming 1966-03-03, MRN [id number] PCP: Miranda Camelia HERO, NP  [facility] Providers Cardiologist:  Deatrice Cage, MD Cardiology APP:  Gerard Frederick, NP     History of Present Illness Miranda Fleming is a 59 y.o. female with a past medical history of tachycardia, dizziness, syncope, who is here today for follow-up.   She had a cervical spine fracture in 2024 and had significant chronic pain since the required frequent epidural injections.  Her mobility was very limited at the present time and she walks slowly with a walker.  Over the last year she has experienced extreme dizziness especially when she was standing up with episodes of loss of consciousness associated with palpitations and tachycardia.  She denied any further associated symptoms or chest pain but did report shortness of breath with minimal exertion.   She was last seen in clinic 09/29/2024 by Dr. Cage.  She continued to suffer from orthostatic hypotension, as she had a significant drop in her blood pressure with a standing position and associated symptoms.  Her heart rate increased from 89-107.  She was encouraged to increase hydration and salt intake.  It was suspected that due to her decrease in physical activities contributed to dysfunctional autonomic regulation.  She was placed on a 2-week monitor and scheduled for an echocardiogram to evaluate functional and structural abnormalities.  She was last seen in clinic 11/01/2024 with multiple complaints.  She states she been having chest pressure and tightness and feels as though something is sitting on her chest which leads to shortness of breath, palpitations and dizziness.  With all of her atypical symptoms she was scheduled for Lexiscan  Myoview  to rule out any ischemic causes of her chest discomfort.   She returns to clinic today today with continued.  Complaints.  Her chest pressure and chest  tightness has slightly improved with recent stress testing.  She said that she had every symptom available from the Lexiscan  and it was horrible but caffeine to reversed her symptoms and she was able to continue with rest of her day.  She continues to have the palpitations and has elevated heart rates up to 144 just sitting on the couch.  Previously she had been working on maintaining adequate hydration and additional salt to elevate her blood pressure.  She has been encouraged to try compression therapy but has yet to try this.  She has falls without injury.  States that she has been compliant with her medications.  Denies any hospitalizations or visits to the emergency department.  ROS: 10 point review of system was reviewed and considered negative the exception was been listed in the HPI  Studies Reviewed     Lexiscan  MPI 11/21/2024   The study is normal. The study is low risk.   No ST deviation was noted.   LV perfusion is normal. There is no evidence of ischemia. There is no evidence of infarction.   Left ventricular function is normal. Nuclear stress EF: 67%. End diastolic cavity size is normal. End systolic cavity size is normal.   Coronary calcium was absent on the attenuation correction CT images.  2d echo 10/31/2024  1. Left ventricular ejection fraction, by estimation, is 55 to 60%. The  left ventricle has normal function. The left ventricle has no regional  wall motion abnormalities. There is mild left ventricular hypertrophy.  Left ventricular diastolic parameters  are indeterminate.   2. Right ventricular  systolic function is normal. The right ventricular  size is normal.   3. The mitral valve is normal in structure. Trivial mitral valve  regurgitation.   4. The aortic valve is tricuspid. Aortic valve regurgitation is not  visualized.   5. The inferior vena cava is normal in size with greater than 50%  respiratory variability, suggesting right atrial pressure of 3 mmHg.     Event monitor (Zio) 10/24/2024 Patient had a min HR of 60 bpm, max HR of 200 bpm, and avg HR of 87 bpm. Predominant underlying rhythm was Sinus Rhythm. Slight P wave morphology changes were noted.  11 Supraventricular Tachycardia runs occurred, the run with the fastest interval lasting 7 beats with a max rate of 200 bpm, the longest lasting 16 beats with an avg rate of 119 bpm.  Rare PACs and rare PVCs. Most triggered events correlated with sinus tachycardia.  Risk Assessment/Calculations           Physical Exam VS:  BP 108/70 (BP Location: Left Arm, Patient Position: Sitting, Cuff Size: Normal)   Pulse (!) 101   Ht 5' 5 (1.651 m)   Wt 113 lb 3.2 oz (51.3 kg)   SpO2 96%   BMI 18.84 kg/m        Wt Readings from Last 3 Encounters:  11/24/24 113 lb 3.2 oz (51.3 kg)  11/01/24 115 lb 3.2 oz (52.3 kg)  10/11/24 125 lb (56.7 kg)    GEN: Well nourished, well developed in no acute distress NECK: No JVD; No carotid bruits CARDIAC: RRR, tachycardic, no murmurs, rubs, gallops RESPIRATORY:  Clear to auscultation without rales, wheezing or rhonchi  ABDOMEN: Soft, non-tender, non-distended EXTREMITIES:  No edema; No deformity   ASSESSMENT AND PLAN Atypical chest discomfort that has improved since her last visit.  She recently underwent a Lexiscan  Myoview  that was considered normal and low risk.  Some of her symptoms have been relieved with knowing her test was okay.  Has no further ischemic evaluation needed at this time.  Orthostatic hypotension that is improved with pushing fluids and salt.  Blood pressure today is 108/78.  She has been encouraged to continue with liberation of her salt recumbent exercises and start with compression therapy.  Intermittent palpitations that she has continued to have sinus tachycardia with rates up to 140.  ZIO monitor revealed average heart rate of 87 bpm underlying rhythm was sinus.  As she has continue to push hydration and salt work and I added low-dose  beta-blocker Toprol -XL 12.5 mg at bedtime to help with some of the tachycardia which will also help increase her pressures at home.  She has been advised that this medication will likely drop her blood pressure anywhere from 4-8 points which is not significant but if she has any symptoms that are worsening of lightheaded dizziness or drop in her blood pressure notify the office.  If need be we may have to offset her hypotension with midodrine.  With her intermittent palpitations and tachycardia is likely a component of dysfunctional autonomic regulation.  Shortness of breath continues to remain stable but has likely multifactorial from physical deconditioning.  Echocardiogram revealed LVEF 55 to 60%, no RWMA, mild LVH, trivial MR.  Lexiscan  Myoview  was considered normal and low risk.       Dispo: Patient to return to clinic to see MD/APP in 4 weeks or sooner if needed after the initiation of beta-blocker therapy.  Signed, Azrielle Springsteen, NP   "

## 2024-11-27 NOTE — Progress Notes (Unsigned)
 PROVIDER NOTE: Interpretation of information contained herein should be left to medically-trained personnel. Specific patient instructions are provided elsewhere under Patient Instructions section of medical record. This document was created in part using AI and STT-dictation technology, any transcriptional errors that may result from this process are unintentional.  Patient: Miranda Fleming  Service: E/M   PCP: Myrna Camelia HERO, NP  DOB: [birth year]  DOS: 11/28/2024  Provider: Eric DELENA Como, MD  MRN: [id number]  Delivery: Face-to-face  Specialty: Interventional Pain Management  Type: Established Patient  Setting: Ambulatory outpatient facility  Specialty designation: 09  Referring Prov.: Myrna Camelia HERO, NP  Location: Outpatient office facility       History of present illness (HPI) [name], a 59 y.o. year old female, is here today because of her Anterolisthesis of cervical spine [M43.12]. [name] primary complain today is No chief complaint on file.  Pertinent problems: [name] has Compression fracture of T1 thoracic vertebra, sequela; Pain in right knee; Carpal tunnel syndrome of right wrist; Median neuropathy; Migraine; Neuropathy; Right radial nerve palsy; Ulnar nerve palsy; Chronic pain syndrome; Dupuytren's disease of palm of hands (Bilateral); Chronic thoracic back pain (1ry area of Pain) (Midline); Thoracic spine pain; Osteopenia determined by x-ray; Left cervical radiculopathy; Right cervical radiculopathy; Cervicalgia; Obstruction of right vertebral artery; Vertebral artery stenosis (developmental) (Right); Ulnar neuritis, left; Arthralgia of both knees; Other spondylosis, thoracic region; Abnormal MRI, thoracic spine (02/16/2024); Abnormal MRI, cervical spine (02/16/2024 & 03/23/2024); Compression fracture of T2 thoracic vertebra, sequela; Compression fracture of T3 thoracic vertebra, sequela; Compression fracture of T4 thoracic vertebra, sequela;  Compression fracture of (T1, T2, T3, T4) thoracic vertebra, sequela; DDD (degenerative disc disease), thoracic; Thoracic vertebrogenic pain; Thoracic facet arthropathy; Thoracic facet joint pain; Grade 1 Anterolisthesis of thoracic spine (T2-3); Grade 1 Anterolisthesis of cervical spine (C7-T1); DDD (degenerative disc disease), cervical; Cervical spondylosis; Lumbar spine pain; Cervicogenic headache (Bilateral); Cervical facet joint pain (Multilevel) (Bilateral); Cervical facet joint arthropathy; Cervical facet syndrome; Spondylosis without myelopathy or radiculopathy, cervical region; Burning pain (cervical) (Left); Neurogenic pain (cervical) (Bilateral) (L>R); Neck pain; Arthralgia of left knee; Arthralgia of right knee; Chronic thoracic back pain (Bilateral); and Spondylosis without myelopathy or radiculopathy, thoracic region on their pertinent problem list.  Pain Assessment: Severity of   is reported as a  /10. Location:    / . Onset:  . Quality:  . Timing:  . Modifying factor(s):  [name] Vitals:  vitals were not taken for this visit.  BMI: Estimated body mass index is 18.84 kg/m as calculated from the following:   Height as of 11/24/24: 5' 5 (1.651 m).   Weight as of 11/24/24: 113 lb 3.2 oz (51.3 kg).  Last encounter: 10/05/2024. Last procedure: 10/11/2024.  Reason for encounter: post-procedure evaluation and assessment.   Discussed the use of AI scribe software for clinical note transcription with the patient, who gave verbal consent to proceed.  History of Present Illness          Post-Procedure Evaluation   Procedure: Thoracic facet medial branch block #1  Laterality: Bilateral (-50)  Level: T5, T6, T7, T8, and T9 Medial Branch Level(s)  Imaging: Fluoroscopy-guided Spinal ([id number]) Anesthesia: Local anesthesia (1-2% Lidocaine ) Anxiolysis: IV Versed   4.0 mg           Sedation: Minimal Sedation Fentanyl  1 mL (50 mcg) DOS: 10/11/2024  Performed by: Eric DELENA Como, MD  Purpose:  Diagnostic/Therapeutic Indications: Thoracic back pain severe enough to impact quality of life or function. Rationale (  medical necessity): procedure needed and proper for the diagnosis and/or treatment of [name] medical symptoms and needs. 1. Chronic thoracic back pain (Bilateral)   2. Thoracic facet arthropathy   3. Thoracic facet joint pain   4. Spondylosis without myelopathy or radiculopathy, thoracic region   5. Grade 1 Anterolisthesis of thoracic spine (T2-3)   6. Thoracic spine pain   7. DDD (degenerative disc disease), thoracic   8. Thoracic vertebrogenic pain   9. Compression of thoracic vertebra, sequela (T1, T2, T3, T4)   10. Non-traumatic compression fracture of T1 thoracic vertebra, sequela   11. Non-traumatic compression fracture of T2 thoracic vertebra, sequela   12. Non-traumatic compression fracture of T3 thoracic vertebra, sequela   13. Non-traumatic compression fracture of T4 thoracic vertebra, sequela   14. Latex precautions, history of latex allergy    NAS-11 Pain score:   Pre-procedure: 9 /10   Post-procedure: 0-No pain/10     Effectiveness:  Initial hour after procedure:   ***. Subsequent 4-6 hours post-procedure:   ***. Analgesia past initial 6 hours:   ***. Ongoing improvement:  Analgesic:  *** Function:    ***    ROM:    ***    Interpretation: ***  Pharmacotherapy Assessment   Opioid Analgesic: No chronic opioid analgesics therapy prescribed by our practice. Tramadol 50 mg tablet, 1 tab p.o. daily (# 30) (30/month) (last filled on 11/28/2023) MME/day: 10 mg/day   Monitoring: [place] PMP: PDMP reviewed during this encounter.       Pharmacotherapy: No side-effects or adverse reactions reported. Compliance: No problems identified. Effectiveness: Clinically acceptable.  No notes on file  UDS:  Summary  Date Value Ref Range Status  12/07/2023 FINAL  Final    Comment:     ==================================================================== Compliance Drug Analysis, Ur ==================================================================== Specimen Alert Not Detected result may be consistent with the time of last use noted for this medication. AS NEEDED (Tramadol) ==================================================================== Test                             Result       Flag       Units  Drug Present and Declared for Prescription Verification   Alprazolam                      419          EXPECTED   ng/mg creat   Alpha-hydroxyalprazolam        758          EXPECTED   ng/mg creat    Source of alprazolam  is a scheduled prescription medication. Alpha-    hydroxyalprazolam is an expected metabolite of alprazolam .  Drug Present not Declared for Prescription Verification   Ibuprofen                       PRESENT      UNEXPECTED   Diphenhydramine                 PRESENT      UNEXPECTED  Drug Absent but Declared for Prescription Verification   Tramadol                       Not Detected UNEXPECTED ng/mg creat ==================================================================== Test                      Result    Flag   Units  Ref Range   Creatinine              31               mg/dL      >=79 ==================================================================== Declared Medications:  The flagging and interpretation on this report are based on the  following declared medications.  Unexpected results may arise from  inaccuracies in the declared medications.   **Note: The testing scope of this panel includes these medications:   Alprazolam  (Xanax )  Tramadol (Ultram)   **Note: The testing scope of this panel does not include the  following reported medications:   Calcium  Estradiol  (Estrace )  Fish Oil  Multivitamin  Supplement  Vitamin B12 ==================================================================== For clinical consultation, please call  [phone]. ====================================================================     No results found for: CBDTHCR No results found for: D8THCCBX No results found for: D9THCCBX  ROS  Constitutional: Denies any fever or chills Gastrointestinal: No reported hemesis, hematochezia, vomiting, or acute GI distress Musculoskeletal: Denies any acute onset joint swelling, redness, loss of ROM, or weakness Neurological: No reported episodes of acute onset apraxia, aphasia, dysarthria, agnosia, amnesia, paralysis, loss of coordination, or loss of consciousness  Medication Review  ALPRAZolam , Multiple Vitamin, b complex vitamins, gabapentin , metoprolol  succinate, and traMADol  History Review  Allergy: Ms. Fifield is allergic to amoxicillin and latex. Drug: Ms. Lauricella  has no history on file for drug use. Alcohol:  reports current alcohol use of about 4.0 standard drinks of alcohol per week. Tobacco:  reports that she quit smoking about 3 years ago. Her smoking use included cigarettes. She started smoking about 44 years ago. She has a 61.6 pack-year smoking history. She has never used smokeless tobacco. Social: Ms. Muller  reports that she quit smoking about 3 years ago. Her smoking use included cigarettes. She started smoking about 44 years ago. She has a 61.6 pack-year smoking history. She has never used smokeless tobacco. She reports current alcohol use of about 4.0 standard drinks of alcohol per week. Medical:  has a past medical history of DVT, lower extremity, distal, acute (HCC), Palpitations (11/23/2015), SUI (stress urinary incontinence, female), and Wears glasses. Surgical: Ms. Mulhearn  has a past surgical history that includes EXPLORATORY LAPAROTOMY W/  LEFT SALPINGOOPHORECTOMY (01-25-2001); Abdominal hysterectomy (1995); LAPAROSCOPY'S AND LAPAROTOMY'S FOR PERSISTANT OVARIAN CYST (X 5  1987;  1989;  1990;  1993;  1994); and Pubovaginal sling (N/A, 10/31/2013). Family: family  history includes Atrial fibrillation in her mother; Breast cancer in her mother; Colon cancer in her paternal grandfather; Diabetes in her maternal aunt; Healthy in her brother and father; Osteopenia in her sister; Osteoporosis in her mother; Ovarian cancer in her maternal grandmother and mother.  Laboratory Chemistry Profile   Renal Lab Results  Component Value Date   BUN 11 09/26/2024   CREATININE 0.70 09/26/2024   BCR 19 12/07/2023   GFRAA >60 12/18/2017   GFRNONAA >60 09/26/2024    Hepatic Lab Results  Component Value Date   AST 56 (H) 09/26/2024   ALT 48 (H) 09/26/2024   ALBUMIN 4.6 09/26/2024   ALKPHOS 88 09/26/2024   LIPASE 35 05/24/2021   AMMONIA <10 01/09/2024    Electrolytes Lab Results  Component Value Date   NA 135 09/26/2024   K 4.2 09/26/2024   CL 96 (L) 09/26/2024   CALCIUM 9.8 09/26/2024   MG 1.8 01/10/2024    Bone Lab Results  Component Value Date   VD25OH 50.8 12/18/2017   25OHVITD1 68 12/07/2023  25OHVITD2 41 12/07/2023   25OHVITD3 27 12/07/2023    Inflammation (CRP: Acute Phase) (ESR: Chronic Phase) Lab Results  Component Value Date   CRP <1 12/07/2023   ESRSEDRATE 3 12/07/2023   LATICACIDVEN 1.4 01/09/2024         Note: Above Lab results reviewed.  Recent Imaging Review  NM Myocar Multi W/Spect W/Wall Motion / EF   The study is normal. The study is low risk.   No ST deviation was noted.   LV perfusion is normal. There is no evidence of ischemia. There is no  evidence of infarction.   Left ventricular function is normal. Nuclear stress EF: 67%. End  diastolic cavity size is normal. End systolic cavity size is normal.   Coronary calcium was absent on the attenuation correction CT images. Note: Reviewed        Physical Exam  Vitals: There were no vitals taken for this visit. BMI: Estimated body mass index is 18.84 kg/m as calculated from the following:   Height as of 11/24/24: 5' 5 (1.651 m).   Weight as of 11/24/24: 113 lb 3.2 oz  (51.3 kg). Ideal: Ideal body weight: 57 kg (125 lb 10.6 oz) General appearance: Well nourished, well developed, and well hydrated. In no apparent acute distress Mental status: Alert, oriented x 3 (person, place, & time)       Respiratory: No evidence of acute respiratory distress Eyes: PERLA   Assessment   Diagnosis Status  1. Grade 1 Anterolisthesis of cervical spine (C7-T1)   2. Chronic thoracic back pain (Bilateral)   3. Chronic thoracic back pain (1ry area of Pain) (Midline)   4. Grade 1 Anterolisthesis of thoracic spine (T2-3)   5. Thoracic facet joint pain   6. Thoracic facet arthropathy   7. Postop check    Controlled Controlled Controlled   Updated Problems: No problems updated.  Plan of Care  Problem-specific:  Assessment and Plan            Ms. Maudry Zeidan has a current medication list which includes the following long-term medication(s): gabapentin  and metoprolol  succinate.  Pharmacotherapy (Medications Ordered): No orders of the defined types were placed in this encounter.  Orders:  No orders of the defined types were placed in this encounter.    Interventional Therapies  Risk Factors  Considerations  Medical Comorbidities:  Allergy: LATEX  Hx. DVT  paroxysmal dizziness     Planned  Pending:   Diagnostic bilateral thoracic facet MBB #1 (with fluoroscopic mapping)   Under consideration:   Diagnostic bilateral thoracic facet MBB #1    Completed:   Diagnostic left cervical ESI x2 (09/13/2024) (100/100/98/98)  Diagnostic/therapeutic bilateral cervical facet (TON-C6) MBB x1 (05/24/2024) (100/100/0/0)  Diagnostic/therapeutic midline (T2-3) thoracic ESI x1 (12/24/2023) (100/100/5/RUE:100LUE:90UB:5-10)  Therapeutic left (T2-3) thoracic ESI x1 (03/10/2024) (100/100/90x36hrs/45)  Therapeutic left (T3-4) thoracic ESI x1 (04/07/2024) (DNKFU-Returned 05/11/2024) (0/0/0/0)    Therapeutic  Palliative (PRN) options:   Therapeutic/palliative  left cervical ESI #3    Completed by other providers:   None reported     No follow-ups on file.    Recent Visits Date Type Provider Dept  10/11/24 Procedure visit Tanya Glisson, MD [facility]  10/05/24 Office Visit Tanya Glisson, MD [facility]  09/13/24 Procedure visit Tanya Glisson, MD [facility]  09/12/24 Office Visit Tanya Glisson, MD [facility]  Showing recent visits within past 90 days and meeting all other requirements Future Appointments Date Type Provider Dept  11/28/24 Appointment  Tanya Glisson, MD [facility]  Showing future appointments within next 90 days and meeting all other requirements  I discussed the assessment and treatment plan with the patient. The patient was provided an opportunity to ask questions and all were answered. The patient agreed with the plan and demonstrated an understanding of the instructions.  Patient advised to call back or seek an in-person evaluation if the symptoms or condition worsens.  Duration of encounter: *** minutes.  Total time on encounter, as per AMA guidelines included both the face-to-face and non-face-to-face time personally spent by the physician and/or other qualified health care professional(s) on the day of the encounter (includes time in activities that require the physician or other qualified health care professional and does not include time in activities normally performed by clinical staff). Physician's time may include the following activities when performed: Preparing to see the patient (e.g., pre-charting review of records, searching for previously ordered imaging, lab work, and nerve conduction tests) Review of prior analgesic pharmacotherapies. Reviewing PMP Interpreting ordered tests (e.g., lab work, imaging, nerve conduction tests) Performing post-procedure evaluations, including interpretation of diagnostic procedures Obtaining and/or  reviewing separately obtained history Performing a medically appropriate examination and/or evaluation Counseling and educating the patient/family/caregiver Ordering medications, tests, or procedures Referring and communicating with other health care professionals (when not separately reported) Documenting clinical information in the electronic or other health record Independently interpreting results (not separately reported) and communicating results to the patient/ family/caregiver Care coordination (not separately reported)  Note by: Glisson DELENA Tanya, MD (TTS and AI technology used. I apologize for any typographical errors that were not detected and corrected.) Date: 11/28/2024; Time: 7:03 PM

## 2024-11-28 ENCOUNTER — Encounter: Payer: Self-pay | Admitting: Pain Medicine

## 2024-11-28 ENCOUNTER — Ambulatory Visit: Attending: Pain Medicine | Admitting: Pain Medicine

## 2024-11-28 VITALS — BP 135/87 | HR 91 | Temp 97.1°F | Ht 65.0 in | Wt 113.0 lb

## 2024-11-28 DIAGNOSIS — M4312 Spondylolisthesis, cervical region: Secondary | ICD-10-CM | POA: Diagnosis not present

## 2024-11-28 DIAGNOSIS — M546 Pain in thoracic spine: Secondary | ICD-10-CM | POA: Diagnosis not present

## 2024-11-28 DIAGNOSIS — Z09 Encounter for follow-up examination after completed treatment for conditions other than malignant neoplasm: Secondary | ICD-10-CM | POA: Diagnosis not present

## 2024-11-28 DIAGNOSIS — M47814 Spondylosis without myelopathy or radiculopathy, thoracic region: Secondary | ICD-10-CM | POA: Diagnosis not present

## 2024-11-28 DIAGNOSIS — G8929 Other chronic pain: Secondary | ICD-10-CM | POA: Diagnosis not present

## 2024-11-28 DIAGNOSIS — M4314 Spondylolisthesis, thoracic region: Secondary | ICD-10-CM | POA: Diagnosis not present

## 2024-11-28 NOTE — Patient Instructions (Signed)
 " ______________________________________________________________________    Procedure instructions  Stop blood-thinners  Do not eat or drink fluids (other than water ) for 8 hours before your procedure  No water  for 2 hours before your procedure  Take your blood pressure medicine with a sip of water   Arrive 30 minutes before your appointment  If sedation is planned, bring suitable driver. [place], [place], & public transportation are NOT APPROVED)  Carefully read the Preparing for your procedure detailed instructions  If you have questions call us  at [phone]  Procedure appointments are for procedures only.   NO medication refills or new problem evaluations will be done on procedure days.   Only the scheduled, pre-approved procedure and side will be done.   ______________________________________________________________________     ______________________________________________________________________    Preparing for your procedure  Appointments: If you think you may not be able to keep your appointment, call 24-48 hours in advance to cancel. We need time to make it available to others.  Procedure visits are for procedures only. During your procedure appointment there will be: NO Prescription Refills*. NO medication changes or discussions*. NO discussion of disability issues*. NO unrelated pain problem evaluations*. NO evaluations to order other pain procedures*. *These will be addressed at a separate and distinct evaluation encounter on the provider's evaluation schedule and not during procedure days.  Instructions: Food intake: Avoid eating anything solid for at least 8 hours prior to your procedure. Clear liquid intake: You may take clear liquids such as water  up to 2 hours prior to your procedure. (No carbonated drinks. No soda.) Transportation: Unless otherwise stated by your physician, bring a driver. (Driver cannot be a [employer]earcher, Pharmacist, Community, or any other form of public  transportation.) Morning Medicines: Except for blood thinners, take all of your other morning medications with a sip of water . Make sure to take your heart and blood pressure medicines. If your blood pressure's lower number is above 100, the case will be rescheduled. Blood thinners: Make sure to stop your blood thinners as instructed.  If you take a blood thinner, but were not instructed to stop it, call our office [phone] and ask to talk to a nurse. Not stopping a blood thinner prior to certain procedures could lead to serious complications. Diabetics on insulin: Notify the staff so that you can be scheduled 1st case in the morning. If your diabetes requires high dose insulin, take only  of your normal insulin dose the morning of the procedure and notify the staff that you have done so. Preventing infections: Shower with an antibacterial soap the morning of your procedure.  Build-up your immune system: Take 1000 mg of Vitamin C with every meal (3 times a day) the day prior to your procedure. Antibiotics: Inform the nursing staff if you are taking any antibiotics or if you have any conditions that may require antibiotics prior to procedures. (Example: recent joint implants)   Pregnancy: If you are pregnant make sure to notify the nursing staff. Not doing so may result in injury to the fetus, including death.  Sickness: If you have a cold, fever, or any active infections, call and cancel or reschedule your procedure. Receiving steroids while having an infection may result in complications. Arrival: You must be in the facility at least 30 minutes prior to your scheduled procedure. Tardiness: Your scheduled time is also the cutoff time. If you do not arrive at least 15 minutes prior to your procedure, you will be rescheduled.  Children: Do not bring any children  with you. Make arrangements to keep them home. Dress appropriately: There is always a possibility that your clothing may get soiled. Avoid  long dresses. Valuables: Do not bring any jewelry or valuables.  Reasons to call and reschedule or cancel your procedure: (Following these recommendations will minimize the risk of a serious complication.) Surgeries: Avoid having procedures within 2 weeks of any surgery. (Avoid for 2 weeks before or after any surgery). Flu Shots: Avoid having procedures within 2 weeks of a flu shots or . (Avoid for 2 weeks before or after immunizations). Barium: Avoid having a procedure within 7-10 days after having had a radiological study involving the use of radiological contrast. (Myelograms, Barium swallow or enema study). Heart attacks: Avoid any elective procedures or surgeries for the initial 6 months after a Myocardial Infarction (Heart Attack). Blood thinners: It is imperative that you stop these medications before procedures. Let us  know if you if you take any blood thinner.  Infection: Avoid procedures during or within two weeks of an infection (including chest colds or gastrointestinal problems). Symptoms associated with infections include: Localized redness, fever, chills, night sweats or profuse sweating, burning sensation when voiding, cough, congestion, stuffiness, runny nose, sore throat, diarrhea, nausea, vomiting, cold or Flu symptoms, recent or current infections. It is specially important if the infection is over the area that we intend to treat. Heart and lung problems: Symptoms that may suggest an active cardiopulmonary problem include: cough, chest pain, breathing difficulties or shortness of breath, dizziness, ankle swelling, uncontrolled high or unusually low blood pressure, and/or palpitations. If you are experiencing any of these symptoms, cancel your procedure and contact your primary care physician for an evaluation.  Remember:  Regular Business hours are:  Monday to Thursday 8:00 AM to 4:00 PM  Provider's Schedule: Eric Como, MD:  Procedure days: Tuesday and Thursday 7:30  AM to 4:00 PM  Wallie Sherry, MD:  Procedure days: Monday and Wednesday 7:30 AM to 4:00 PM Last  Updated: 10/20/2023 ______________________________________________________________________     ______________________________________________________________________    General Risks and Possible Complications  Patient Responsibilities: It is important that you read this as it is part of your informed consent. It is our duty to inform you of the risks and possible complications associated with treatments offered to you. It is your responsibility as a patient to read this and to ask questions about anything that is not clear or that you believe was not covered in this document.  Patients Rights: You have the right to refuse treatment. You also have the right to change your mind, even after initially having agreed to have the treatment done. However, under this last option, if you wait until the last second to change your mind, you may be charged for the materials used up to that point.  Introduction: Medicine is not an visual merchandiser. Everything in Medicine, including the lack of treatment(s), carries the potential for danger, harm, or loss (which is by definition: Risk). In Medicine, a complication is a secondary problem, condition, or disease that can aggravate an already existing one. All treatments carry the risk of possible complications. The fact that a side effects or complications occurs, does not imply that the treatment was conducted incorrectly. It must be clearly understood that these can happen even when everything is done following the highest safety standards.  No treatment: You can choose not to proceed with the proposed treatment alternative. The PRO(s) would include: avoiding the risk of complications associated with the therapy. The CON(s) would  include: not getting any of the treatment benefits. These benefits fall under one of three categories: diagnostic; therapeutic; and/or  palliative. Diagnostic benefits include: getting information which can ultimately lead to improvement of the disease or symptom(s). Therapeutic benefits are those associated with the successful treatment of the disease. Finally, palliative benefits are those related to the decrease of the primary symptoms, without necessarily curing the condition (example: decreasing the pain from a flare-up of a chronic condition, such as incurable terminal cancer).  General Risks and Complications: These are associated to most interventional treatments. They can occur alone, or in combination. They fall under one of the following six (6) categories: no benefit or worsening of symptoms; bleeding; infection; nerve damage; allergic reactions; and/or death. No benefits or worsening of symptoms: In Medicine there are no guarantees, only probabilities. No healthcare provider can ever guarantee that a medical treatment will work, they can only state the probability that it may. Furthermore, there is always the possibility that the condition may worsen, either directly, or indirectly, as a consequence of the treatment. Bleeding: This is more common if the patient is taking a blood thinner, either prescription or over the counter (example: Goody Powders, Fish oil, Aspirin , Garlic, etc.), or if suffering a condition associated with impaired coagulation (example: Hemophilia, cirrhosis of the liver, low platelet counts, etc.). However, even if you do not have one on these, it can still happen. If you have any of these conditions, or take one of these drugs, make sure to notify your treating physician. Infection: This is more common in patients with a compromised immune system, either due to disease (example: diabetes, cancer, human immunodeficiency virus [HIV], etc.), or due to medications or treatments (example: therapies used to treat cancer and rheumatological diseases). However, even if you do not have one on these, it can still  happen. If you have any of these conditions, or take one of these drugs, make sure to notify your treating physician. Nerve Damage: This is more common when the treatment is an invasive one, but it can also happen with the use of medications, such as those used in the treatment of cancer. The damage can occur to small secondary nerves, or to large primary ones, such as those in the spinal cord and brain. This damage may be temporary or permanent and it may lead to impairments that can range from temporary numbness to permanent paralysis and/or brain death. Allergic Reactions: Any time a substance or material comes in contact with our body, there is the possibility of an allergic reaction. These can range from a mild skin rash (contact dermatitis) to a severe systemic reaction (anaphylactic reaction), which can result in death. Death: In general, any medical intervention can result in death, most of the time due to an unforeseen complication. ______________________________________________________________________      ______________________________________________________________________    Steroid injections  Common steroids for injections Triamcinolone : Used by many sports medicine physicians for large joint and bursal injections, often combined with a local anesthetic like lidocaine . A study focusing on coccydynia (tailbone pain) found triamcinolone  was more effective than betamethasone, suggesting it may also be preferable for other localized inflammation conditions. Methylprednisolone: A common alternative to triamcinolone  that is also a strong anti-inflammatory. It is available in different formulations, with the acetate suspension being the long-acting option for intra-articular injections. Dexamethasone : This is a non-particulate steroid, meaning it has a lower risk of tissue damage compared to particulate steroids like triamcinolone  and methylprednisolone. While less common for this specific  use, it is an option for targeted injections.   Considerations for physicians Particulate vs. non-particulate steroids: Triamcinolone  and methylprednisolone are particulate, meaning they can clump together. Dexamethasone  is non-particulate. Particulate steroids are often preferred for their longer-lasting effects but carry a theoretical higher risk for certain injections (though this is less of a concern in the costochondral joints). Combined injectate: Corticosteroids are typically mixed with a local anesthetic like lidocaine  to provide both immediate pain relief (from the anesthetic) and longer-term inflammation reduction (from the steroid). Imaging guidance: To ensure accurate placement of the needle and medication, physicians may use ultrasound or fluoroscopic guidance for the injection, especially in complex or refractory cases.   Patient guidance Before undergoing a steroid injection, discuss the options with your physician. They will determine the best steroid, dosage, and procedure for your specific case based on factors like: Severity of your condition History of response to other treatments Your overall health status Experience and preference of the physician  Last  Updated: 07/05/2024 ______________________________________________________________________     "

## 2024-11-28 NOTE — Progress Notes (Signed)
 Safety precautions to be maintained throughout the outpatient stay will include: orient to surroundings, keep bed in low position, maintain call bell within reach at all times, provide assistance with transfer out of bed and ambulation.

## 2024-12-01 ENCOUNTER — Other Ambulatory Visit: Payer: Self-pay

## 2024-12-01 MED ORDER — BISOPROLOL FUMARATE 2.5 MG PO TABS: 1.0000 | ORAL_TABLET | Freq: Every day | ORAL | 3 refills | Status: AC

## 2024-12-01 NOTE — Telephone Encounter (Signed)
 Stop the Toprol -XL 12.5 mg daily and start bisoprolol  2.5 mg daily.  Recommend monitoring blood pressure and heart rate at home as well as for any undesired side effects.

## 2024-12-01 NOTE — Progress Notes (Signed)
 Medication change due to side effects

## 2024-12-01 NOTE — Telephone Encounter (Signed)
 Called patient to find out what side effects she was having from Toprol  XL 12.5 mg Patient states that she is unable to sleep and diarrhea  Please advise treatment change

## 2024-12-08 ENCOUNTER — Encounter: Payer: Self-pay | Admitting: Pain Medicine

## 2024-12-08 ENCOUNTER — Ambulatory Visit (HOSPITAL_BASED_OUTPATIENT_CLINIC_OR_DEPARTMENT_OTHER): Admitting: Pain Medicine

## 2024-12-08 ENCOUNTER — Ambulatory Visit
Admission: RE | Admit: 2024-12-08 | Discharge: 2024-12-08 | Disposition: A | Discharge status: Home / Self Care | Source: Ambulatory Visit | Attending: Pain Medicine | Admitting: Pain Medicine

## 2024-12-08 VITALS — BP 126/52 | HR 84 | Temp 97.0°F | Resp 16 | Ht 65.0 in | Wt 120.0 lb

## 2024-12-08 DIAGNOSIS — M546 Pain in thoracic spine: Secondary | ICD-10-CM | POA: Diagnosis not present

## 2024-12-08 DIAGNOSIS — M47814 Spondylosis without myelopathy or radiculopathy, thoracic region: Secondary | ICD-10-CM

## 2024-12-08 DIAGNOSIS — X58XXXS Exposure to other specified factors, sequela: Secondary | ICD-10-CM | POA: Insufficient documentation

## 2024-12-08 DIAGNOSIS — M5134 Other intervertebral disc degeneration, thoracic region: Secondary | ICD-10-CM | POA: Diagnosis not present

## 2024-12-08 DIAGNOSIS — Z5189 Encounter for other specified aftercare: Secondary | ICD-10-CM | POA: Insufficient documentation

## 2024-12-08 DIAGNOSIS — S22028S Other fracture of second thoracic vertebra, sequela: Secondary | ICD-10-CM | POA: Insufficient documentation

## 2024-12-08 DIAGNOSIS — M4314 Spondylolisthesis, thoracic region: Secondary | ICD-10-CM | POA: Insufficient documentation

## 2024-12-08 DIAGNOSIS — S22038S Other fracture of third thoracic vertebra, sequela: Secondary | ICD-10-CM | POA: Diagnosis not present

## 2024-12-08 DIAGNOSIS — S22048S Other fracture of fourth thoracic vertebra, sequela: Secondary | ICD-10-CM | POA: Insufficient documentation

## 2024-12-08 DIAGNOSIS — G8929 Other chronic pain: Secondary | ICD-10-CM

## 2024-12-08 DIAGNOSIS — S22000S Wedge compression fracture of unspecified thoracic vertebra, sequela: Secondary | ICD-10-CM

## 2024-12-08 DIAGNOSIS — S22018S Other fracture of first thoracic vertebra, sequela: Secondary | ICD-10-CM | POA: Insufficient documentation

## 2024-12-08 MED ORDER — LIDOCAINE HCL 2 % IJ SOLN
20.0000 mL | Freq: Once | INTRAMUSCULAR | Status: AC
  Administered 2024-12-08: 400 mg
  Filled 2024-12-08: qty 40

## 2024-12-08 MED ORDER — FENTANYL CITRATE (PF) 100 MCG/2ML IJ SOLN
25.0000 ug | INTRAMUSCULAR | Status: DC | PRN
  Administered 2024-12-08: 100 ug via INTRAVENOUS
  Filled 2024-12-08: qty 2

## 2024-12-08 MED ORDER — ROPIVACAINE HCL 2 MG/ML IJ SOLN
18.0000 mL | Freq: Once | INTRAMUSCULAR | Status: AC
  Administered 2024-12-08: 18 mL via PERINEURAL
  Filled 2024-12-08: qty 20

## 2024-12-08 MED ORDER — MIDAZOLAM HCL (PF) 2 MG/2ML IJ SOLN
0.5000 mg | Freq: Once | INTRAMUSCULAR | Status: AC
  Administered 2024-12-08: 2 mg via INTRAVENOUS
  Filled 2024-12-08: qty 2

## 2024-12-08 MED ORDER — PENTAFLUOROPROP-TETRAFLUOROETH EX AERO
INHALATION_SPRAY | Freq: Once | CUTANEOUS | Status: AC
  Administered 2024-12-08: 30 via TOPICAL

## 2024-12-08 MED ORDER — DEXAMETHASONE SOD PHOSPHATE PF 10 MG/ML IJ SOLN
20.0000 mg | Freq: Once | INTRAMUSCULAR | Status: AC
  Administered 2024-12-08: 20 mg
  Filled 2024-12-08: qty 2

## 2024-12-08 NOTE — Progress Notes (Signed)
 PROVIDER NOTE: Interpretation of information contained herein should be left to medically-trained personnel. Specific patient instructions are provided elsewhere under Patient Instructions section of medical record. This document was created in part using STT-dictation technology, any transcriptional errors that may result from this process are unintentional.  Patient: Miranda Fleming Type: Established DOB: 1965/12/26 MRN: [id number] PCP: Myrna Camelia HERO, NP  Service: Procedure DOS: 12/08/2024 Setting: Ambulatory Location: Ambulatory outpatient facility Delivery: Face-to-face Provider: Eric DELENA Como, MD Specialty: Interventional Pain Management Specialty designation: 09 Location: Outpatient facility Ref. Prov.: Myrna Camelia HERO, NP       Interventional Therapy   Procedure: Thoracic facet medial branch block #2  Laterality: Bilateral (-50)  Level: T5, T6, T7, T8, and T9 Medial Branch Level(s)  Imaging: Fluoroscopy-guided Spinal ([id number]) Anesthesia: Local anesthesia (1-2% Lidocaine ) Anxiolysis: IV Versed   2.0 mg           Analgesia: No Sedation Fentanyl  2.0 mL (100 mcg) DOS: 12/08/2024  Performed by: Eric DELENA Como, MD  Purpose: Diagnostic/Therapeutic Indications: Thoracic back pain severe enough to impact quality of life or function. Rationale (medical necessity): procedure needed and proper for the diagnosis and/or treatment of Miranda Fleming's medical symptoms and needs. 1. Chronic thoracic back pain (Bilateral)   2. Compression fracture of (T1, T2, T3, T4) thoracic vertebra, sequela   3. DDD (degenerative disc disease), thoracic   4. Grade 1 Anterolisthesis of thoracic spine (T2-3)   5. Spondylosis without myelopathy or radiculopathy, thoracic region   6. Thoracic facet arthropathy   7. Thoracic facet joint pain   8. Thoracic spine pain    NAS-11 Pain score:   Pre-procedure: 9 /10   Post-procedure: 0-No pain/10     Target: Thoracic posterior (dorsal)  primary rami nerve Location: 2.5 cm lateral to midline (spinous process), just cephalad to upper transverse process edge.  (needle tip placement) Region: Thoracic  Approach: Paravertebral  Type of procedure: Percutaneous perineural nerve block   Pertinent Anatomy: There are two potential fascial compartments in the TPVS: the anterior extrapleural paravertebral compartment and the posterior subendothoracic paravertebral compartment. The transverse process and the superior costotransverse ligament form the posterior boundary.  Position / Prep / Materials:  Position: Prone  Prep solution: ChloraPrep (2% chlorhexidine gluconate and 70% isopropyl alcohol) Prep Area: Entire upper back region Materials:  Tray: Block Needle(s):  Type: Spinal  Gauge (G): 22  Length: 3.5-in  Qty: 4  H&P (Pre-op Assessment):  Miranda Fleming is a 59 y.o. (year old), female patient, seen today for interventional treatment. She  has a past surgical history that includes EXPLORATORY LAPAROTOMY W/  LEFT SALPINGOOPHORECTOMY (01-25-2001); Abdominal hysterectomy (1995); LAPAROSCOPY'S AND LAPAROTOMY'S FOR PERSISTANT OVARIAN CYST (X 5  1987;  1989;  1990;  1993;  1994); and Pubovaginal sling (N/A, 10/31/2013). Miranda Fleming has a current medication list which includes the following prescription(s): alprazolam , b complex vitamins, bisoprolol  fumarate, gabapentin , multiple vitamin, tramadol, and escitalopram, and the following Facility-Administered Medications: fentanyl . Her primarily concern today is the Back Pain (upper)  Initial Vital Signs:  Pulse/HCG Rate: 95ECG Heart Rate: 85 Temp: (!) 97 F (36.1 C) Resp: 16 BP: 114/66 SpO2: 99 %  BMI: Estimated body mass index is 19.97 kg/m as calculated from the following:   Height as of this encounter: 5' 5 (1.651 m).   Weight as of this encounter: 120 lb (54.4 kg).  Risk Assessment: Allergies: Reviewed. She is allergic to amoxicillin and latex.  Allergy Precautions: None  required Coagulopathies: Reviewed. None identified.  Blood-thinner therapy: None  at this time Active Infection(s): Reviewed. None identified. Miranda Fleming is afebrile  Site Confirmation: Miranda Fleming was asked to confirm the procedure and laterality before marking the site Procedure checklist: Completed Consent: Before the procedure and under the influence of no sedative(s), amnesic(s), or anxiolytics, the patient was informed of the treatment options, risks and possible complications. To fulfill our ethical and legal obligations, as recommended by the American Medical Association's Code of Ethics, I have informed the patient of my clinical impression; the nature and purpose of the treatment or procedure; the risks, benefits, and possible complications of the intervention; the alternatives, including doing nothing; the risk(s) and benefit(s) of the alternative treatment(s) or procedure(s); and the risk(s) and benefit(s) of doing nothing. The patient was provided information about the general risks and possible complications associated with the procedure. These may include, but are not limited to: failure to achieve desired goals, infection, bleeding, organ or nerve damage, allergic reactions, paralysis, and death. In addition, the patient was informed of those risks and complications associated to Spine-related procedures, such as failure to decrease pain; infection (i.e.: Meningitis, epidural or intraspinal abscess); bleeding (i.e.: epidural hematoma, subarachnoid hemorrhage, or any other type of intraspinal or peri-dural bleeding); organ or nerve damage (i.e.: Any type of peripheral nerve, nerve root, or spinal cord injury) with subsequent damage to sensory, motor, and/or autonomic systems, resulting in permanent pain, numbness, and/or weakness of one or several areas of the body; allergic reactions; (i.e.: anaphylactic reaction); and/or death. Furthermore, the patient was informed of those risks and  complications associated with the medications. These include, but are not limited to: allergic reactions (i.e.: anaphylactic or anaphylactoid reaction(s)); adrenal axis suppression; blood sugar elevation that in diabetics may result in ketoacidosis or comma; water  retention that in patients with history of congestive heart failure may result in shortness of breath, pulmonary edema, and decompensation with resultant heart failure; weight gain; swelling or edema; medication-induced neural toxicity; particulate matter embolism and blood vessel occlusion with resultant organ, and/or nervous system infarction; and/or aseptic necrosis of one or more joints. Finally, the patient was informed that Medicine is not an exact science; therefore, there is also the possibility of unforeseen or unpredictable risks and/or possible complications that may result in a catastrophic outcome. The patient indicated having understood very clearly. We have given the patient no guarantees and we have made no promises. Enough time was given to the patient to ask questions, all of which were answered to the patient's satisfaction. Ms. Koelzer has indicated that she wanted to continue with the procedure. Attestation: I, the ordering provider, attest that I have discussed with the patient the benefits, risks, side-effects, alternatives, likelihood of achieving goals, and potential problems during recovery for the procedure that I have provided informed consent. Date  Time: 12/08/2024  8:41 AM  Pre-Procedure Preparation:  Monitoring: As per clinic protocol. Respiration, ETCO2, SpO2, BP, heart rate and rhythm monitor placed and checked for adequate function Safety Precautions: Patient was assessed for positional comfort and pressure points before starting the procedure. Time-out: I initiated and conducted the Time-out before starting the procedure, as per protocol. The patient was asked to participate by confirming the accuracy of the  Time Out information. Verification of the correct person, site, and procedure were performed and confirmed by me, the nursing staff, and the patient. Time-out conducted as per Joint Commission's Universal Protocol (UP.01.01.01). Time: 0931 Start Time: 0931 hrs.  Description/Narrative of Procedure:          Start Time:  0931 hrs. Technical description of procedure:  Rationale (medical necessity): procedure needed and proper for the diagnosis and/or treatment of the patient's medical symptoms and needs. Procedural Technique Safety Precautions: Aspiration looking for blood return was conducted prior to all injections. At no point did we inject any substances, as a needle was being advanced. No attempts were made at seeking any paresthesias. Safe injection practices and needle disposal techniques used. Medications properly checked for expiration dates. SDV (single dose vial) medications used. Target Area: For the thoracic medial branch nerve, the target is located between the top of the transverse processes at the point where the transverse process joints the vertebra and the superior-lateral aspect of the transverse process.  (More lateral towards the superior aspect of the thoracic spine.) For safety reasons and to minimize the risk of hemo- or pneumothorax, the needle tip was not advanced past the boundary of the posterior paravertebral compartment (superior costotransverse ligament). Description of the Procedure: Protocol guidelines were followed. The patient was placed in position over the fluoroscopy table. The target area was identified and the area prepped in the usual manner. Skin & deeper tissues infiltrated with local anesthetic. Appropriate amount of time allowed to pass for local anesthetics to take effect. The procedure needles were then advanced to the target area. Proper needle placement secured. Negative aspiration confirmed. Solution injected in intermittent fashion, asking for systemic  symptoms every 0.5cc of injectate. The needles were then removed and the area cleansed, making sure to leave some of the prepping solution back to take advantage of its long term bactericidal properties.             Facet Joint Innervation  T1-2 C8, T1  Medial Branch  T2-3 T1, T2                     T3-4 T2, T3                     T4-5 T3, T4                     T5-6 T4, T5                     T6-7 T5, T6                     T7-8 T6, T7                     T8-9 T7, T8                     T9-10 T8, T9                     T10-11 T9, T10                     T11-12 T10, T11                     T12-L1 T11, T12                       Vitals:   12/08/24 0925 12/08/24 0930 12/08/24 0938 12/08/24 0946  BP: 126/78 120/62 108/63 (!) 126/52  Pulse:    84  Resp: 18 17 18 16   Temp:      TempSrc:      SpO2: 99% 97% 98% 99%  Weight:  Height:         End Time: 0937 hrs.  Imaging Guidance (Spinal):         Type of Imaging Technique: Fluoroscopy Guidance (Spinal) Indication(s): Fluoroscopy guidance for needle placement to enhance accuracy in procedures requiring precise needle localization for targeted delivery of medication in or near specific anatomical locations not easily accessible without such real-time imaging assistance. Exposure Time: Please see nurses notes. Contrast: None used. Fluoroscopic Guidance: I was personally present during the use of fluoroscopy. Tunnel Vision Technique used to obtain the best possible view of the target area. Parallax error corrected before commencing the procedure. Direction-depth-direction technique used to introduce the needle under continuous pulsed fluoroscopy. Once target was reached, antero-posterior, oblique, and lateral fluoroscopic projection used confirm needle placement in all planes. Images permanently stored in EMR. Interpretation: No contrast injected. I personally interpreted the imaging intraoperatively.  Adequate needle placement confirmed in multiple planes. Permanent images saved into the patient's record.  Post-operative Assessment:  Post-procedure Vital Signs:  Pulse/HCG Rate: 8483 Temp: (!) 97 F (36.1 C) Resp: 16 BP: (!) 126/52 SpO2: 99 %  EBL: None  Complications: No immediate post-treatment complications observed by team, or reported by patient.  Note: The patient tolerated the entire procedure well. A repeat set of vitals were taken after the procedure and the patient was kept under observation following institutional policy, for this type of procedure. Post-procedural neurological assessment was performed, showing return to baseline, prior to discharge. The patient was provided with post-procedure discharge instructions, including a section on how to identify potential problems. Should any problems arise concerning this procedure, the patient was given instructions to immediately contact us , at any time, without hesitation. In any case, we plan to contact the patient by telephone for a follow-up status report regarding this interventional procedure.  Comments:  No additional relevant information.  Plan of Care (POC)  Orders:  Orders Placed This Encounter  Procedures   THORACIC FACET BLOCK    Scheduling Instructions:     Thoracic Medial Branch Block     Side: Bilateral     Level(s): TBD     Procedural Analgesia/Anxiolysis: Patient's choice     Date: 12/08/2024    Where will this procedure be performed?:   [facility]   [facility] C-ARM 1-60 MIN NO REPORT    Intraoperative interpretation by procedural physician at [facility].    Standing Status:   Standing    Number of Occurrences:   1    Reason for exam::   Assistance in needle guidance and placement for procedures requiring needle placement in or near specific anatomical locations not easily accessible without such assistance.   Informed Consent Details: Physician/Practitioner Attestation;  Transcribe to consent form and obtain patient signature    Nursing Order: Transcribe to consent form and obtain patient signature. Note: Always confirm laterality of pain with Ms. Tangney, before procedure.    Physician/Practitioner attestation of informed consent for procedure/surgical case:   I, the physician/practitioner, attest that I have discussed with the patient the benefits, risks, side effects, alternatives, likelihood of achieving goals and potential problems during recovery for the procedure that I have provided informed consent.    Procedure:   Thoracic medial branch facet block    Physician/Practitioner performing the procedure:   Mitsuo Budnick A. 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    Standing Status:   Standing    Number of Occurrences:   1     Opioid Analgesic: No chronic opioid analgesics therapy prescribed by our practice. Tramadol 50 mg tablet, 1 tab p.o. daily (# 30) (30/month) (last filled on 11/28/2023) MME/day: 10 mg/day    Medications ordered for procedure: Meds ordered this encounter  Medications   lidocaine  (XYLOCAINE ) 2 % (with pres) injection 400 mg   pentafluoroprop-tetrafluoroeth (GEBAUERS) aerosol   midazolam  PF (VERSED ) injection 0.5-2 mg    Make sure Flumazenil is available in the pyxis when using this medication. If oversedation occurs, administer 0.2 mg IV over 15 sec. If after 45 sec no  response, administer 0.2 mg again over 1 min; may repeat at 1 min intervals; not to exceed 4 doses (1 mg)   fentaNYL  (SUBLIMAZE ) injection 25-50 mcg    Make sure Narcan is available in the pyxis when using this medication. In the event of respiratory depression (RR< 8/min): Titrate NARCAN (naloxone) in increments of 0.1 to 0.2 mg IV at 2-3 minute intervals, until desired degree of reversal.   ropivacaine  (PF) 2 mg/mL (0.2%) (NAROPIN ) injection 18 mL   dexamethasone  (DECADRON ) injection 20 mg   Medications administered: We administered lidocaine , pentafluoroprop-tetrafluoroeth, midazolam  PF, fentaNYL , ropivacaine  (PF) 2 mg/mL (0.2%), and dexamethasone .  See the medical record for exact dosing, route, and time of administration.    Interventional Therapies  Risk Factors  Considerations  Medical Comorbidities:  Allergy: LATEX  Hx. DVT  paroxysmal dizziness     Planned  Pending:   Diagnostic bilateral thoracic facet MBB #2 (with fluoroscopic mapping)   Under consideration:   Diagnostic bilateral thoracic facet MBB #2    Completed:   Diagnostic bilateral thoracic facet MBB x1 (T5-T9 MBB mapping) (10/11/2024) (100/100/80 x 4 weeks/0)  Diagnostic left cervical ESI x2 (09/13/2024) (100/100/98/98)  Diagnostic/therapeutic bilateral cervical facet (TON-C6) MBB x1 (05/24/2024) (100/100/0/0)  Diagnostic/therapeutic midline (T2-3) thoracic ESI x1 (12/24/2023) (100/100/5/RUE:100LUE:90UB:5-10)  Therapeutic left (T2-3) thoracic ESI x1 (03/10/2024) (100/100/90x36hrs/45)  Therapeutic left (T3-4) thoracic ESI x1 (04/07/2024) (DNKFU-Returned 05/11/2024) (0/0/0/0)    Therapeutic  Palliative (PRN) options:   Therapeutic/palliative left cervical ESI #3    Completed by other providers:   None reported      Follow-up plan:   Return in about 2 weeks (around 12/22/2024) for (Face2F), (PPE).     Recent Visits Date Type Provider Dept  11/28/24 Office Visit Tanya Glisson, MD [facility]  10/11/24 Procedure visit Tanya Glisson, MD [facility]  10/05/24 Office Visit Tanya Glisson, MD [facility]  09/13/24 Procedure visit Tanya Glisson, MD [facility]  09/12/24 Office Visit Tanya Glisson, MD [facility]  Showing recent visits within past 90 days and meeting all other requirements Today's Visits Date Type Provider Dept  12/08/24 Procedure visit Tanya Glisson, MD [facility]  Showing today's visits and meeting all other requirements Future Appointments Date Type Provider Dept  12/28/24 Appointment Tanya Glisson, MD [facility]  Showing future appointments within  next 90 days and meeting all other requirements   Disposition: Discharge home  Discharge (Date  Time): 12/08/2024; 0949 hrs.   Primary Care Physician: Myrna Camelia HERO, NP Location: [facility] Note by: Eric DELENA Como, MD (TTS technology used. I apologize for any typographical errors that were not detected and corrected.) Date: 12/08/2024; Time: 10:22 AM  Disclaimer:  Medicine is not an visual merchandiser. The only guarantee in medicine is that nothing is guaranteed. It is important to note that the decision to proceed with this intervention was based on the information collected from the patient. The Data and conclusions were drawn from the patient's questionnaire, the interview, and the physical examination. Because the information was provided in large part by the patient, it cannot be guaranteed that it has not been purposely or unconsciously manipulated. Every effort has been made to obtain as much relevant data as possible for this evaluation. It is important to note that the conclusions that lead to this procedure are derived in large part from the available data. Always take into account that the treatment will also be dependent on availability of resources and existing treatment guidelines,  considered by other Pain Management Practitioners as being common knowledge and practice, at the time of the intervention. For Medico-Legal purposes, it is also important to point out that variation in procedural techniques and pharmacological choices are the acceptable norm. The indications, contraindications, technique, and results of the above procedure should only be interpreted and judged by a Board-Certified Interventional Pain Specialist with extensive familiarity and expertise in the same exact procedure and technique.

## 2024-12-08 NOTE — Patient Instructions (Signed)
 ______________________________________________________________________    Post-Procedure Discharge Instructions  INSTRUCTIONS Apply ice:  Purpose: This will minimize any swelling and discomfort after procedure.  When: Day of procedure, as soon as you get home. How: Fill a plastic sandwich bag with crushed ice. Cover it with a small towel and apply to injection site. How long: (15 min on, 15 min off) Apply for 15 minutes then remove x 15 minutes.  Repeat sequence on day of procedure, until you go to bed. Apply heat:  Purpose: To treat any soreness and discomfort from the procedure. When: Starting the next day after the procedure. How: Apply heat to procedure site starting the day following the procedure. How long: May continue to repeat daily, until discomfort goes away. Food intake: Start with clear liquids (like water) and advance to regular food, as tolerated.  Physical activities: Keep activities to a minimum for the first 8 hours after the procedure. After that, then as tolerated. Driving: If you have received any sedation, be responsible and do not drive. You are not allowed to drive for 24 hours after having sedation. Blood thinner: (Applies only to those taking blood thinners) You may restart your blood thinner 6 hours after your procedure. Insulin: (Applies only to Diabetic patients taking insulin) As soon as you can eat, you may resume your normal dosing schedule. Infection prevention: Keep procedure site clean and dry. Shower daily and clean area with soap and water.  PAIN DIARY Post-procedure Pain Diary: Extremely important that this be done correctly and accurately. Recorded information will be used to determine the next step in treatment. For the purpose of accuracy, follow these rules: Evaluate only the area treated. Do not report or include pain from an untreated area. For the purpose of this evaluation, ignore all other areas of pain, except for the treated area. After your  procedure, avoid taking a long nap and attempting to complete the pain diary after you wake up. Instead, set your alarm clock to go off every hour, on the hour, for the initial 8 hours after the procedure. Document the duration of the numbing medicine, and the relief you are getting from it. Do not go to sleep and attempt to complete it later. It will not be accurate. If you received sedation, it is likely that you were given a medication that may cause amnesia. Because of this, completing the diary at a later time may cause the information to be inaccurate. This information is needed to plan your care. Follow-up appointment: Keep your post-procedure follow-up evaluation appointment after the procedure (usually 2 weeks for most procedures, 6 weeks for radiofrequencies). DO NOT FORGET to bring you pain diary with you.   EXPECT... (What should I expect to see with my procedure?) From numbing medicine (AKA: Local Anesthetics): Numbness or decrease in pain. You may also experience some weakness, which if present, could last for the duration of the local anesthetic. Onset: Full effect within 15 minutes of injected. Duration: It will depend on the type of local anesthetic used. On the average, 1 to 8 hours.  From steroids (Applies only if steroids were used): Decrease in swelling or inflammation. Once inflammation is improved, relief of the pain will follow. Onset of benefits: Depends on the amount of swelling present. The more swelling, the longer it will take for the benefits to be seen. In some cases, up to 10 days. Duration: Steroids will stay in the system x 2 weeks. Duration of benefits will depend on multiple posibilities including persistent irritating  factors. Side-effects: If present, they may typically last 2 weeks (the duration of the steroids). Frequent: Cramps (if they occur, drink Gatorade and take over-the-counter Magnesium 450-500 mg once to twice a day); water retention with temporary weight  gain; increases in blood sugar; decreased immune system response; increased appetite. Occasional: Facial flushing (red, warm cheeks); mood swings; menstrual changes. Uncommon: Long-term decrease or suppression of natural hormones; bone thinning. (These are more common with higher doses or more frequent use. This is why we prefer that our patients avoid having any injection therapies in other practices.)  Very Rare: Severe mood changes; psychosis; aseptic necrosis. From procedure: Some discomfort is to be expected once the numbing medicine wears off. This should be minimal if ice and heat are applied as instructed.  CALL IF... (When should I call?) You experience numbness and weakness that gets worse with time, as opposed to wearing off. New onset bowel or bladder incontinence. (Applies only to procedures done in the spine)  Emergency Numbers: Durning business hours (Monday - Thursday, 8:00 AM - 4:00 PM) (Friday, 9:00 AM - 12:00 Noon): [phone] After hours: [phone] NOTE: If you are having a problem and are unable connect with, or to talk to a provider, then go to your nearest urgent care or emergency department. If the problem is serious and urgent, please call 911. ______________________________________________________________________     ______________________________________________________________________    Steroid injections  Common steroids for injections Triamcinolone : Used by many sports medicine physicians for large joint and bursal injections, often combined with a local anesthetic like lidocaine . A study focusing on coccydynia (tailbone pain) found triamcinolone  was more effective than betamethasone, suggesting it may also be preferable for other localized inflammation conditions. Methylprednisolone: A common alternative to triamcinolone  that is also a strong anti-inflammatory. It is available in different formulations, with the acetate suspension being the long-acting  option for intra-articular injections. Dexamethasone : This is a non-particulate steroid, meaning it has a lower risk of tissue damage compared to particulate steroids like triamcinolone  and methylprednisolone. While less common for this specific use, it is an option for targeted injections.   Considerations for physicians Particulate vs. non-particulate steroids: Triamcinolone  and methylprednisolone are particulate, meaning they can clump together. Dexamethasone  is non-particulate. Particulate steroids are often preferred for their longer-lasting effects but carry a theoretical higher risk for certain injections (though this is less of a concern in the costochondral joints). Combined injectate: Corticosteroids are typically mixed with a local anesthetic like lidocaine  to provide both immediate pain relief (from the anesthetic) and longer-term inflammation reduction (from the steroid). Imaging guidance: To ensure accurate placement of the needle and medication, physicians may use ultrasound or fluoroscopic guidance for the injection, especially in complex or refractory cases.   Patient guidance Before undergoing a steroid injection, discuss the options with your physician. They will determine the best steroid, dosage, and procedure for your specific case based on factors like: Severity of your condition History of response to other treatments Your overall health status Experience and preference of the physician  Last  Updated: 07/05/2024 ______________________________________________________________________

## 2024-12-20 ENCOUNTER — Ambulatory Visit: Admitting: Cardiology

## 2024-12-28 ENCOUNTER — Ambulatory Visit: Admitting: Pain Medicine
